# Patient Record
Sex: Female | Born: 1960 | Race: White | Hispanic: No | Smoking: Never smoker
Health system: Southern US, Community
[De-identification: ages and names within clinical notes are randomized; demographics above are authoritative.]

## PROBLEM LIST (undated history)

## (undated) DIAGNOSIS — F319 Bipolar disorder, unspecified: Secondary | ICD-10-CM

## (undated) DIAGNOSIS — F419 Anxiety disorder, unspecified: Secondary | ICD-10-CM

## (undated) DIAGNOSIS — F329 Major depressive disorder, single episode, unspecified: Secondary | ICD-10-CM

## (undated) DIAGNOSIS — E785 Hyperlipidemia, unspecified: Secondary | ICD-10-CM

## (undated) DIAGNOSIS — I10 Essential (primary) hypertension: Secondary | ICD-10-CM

## (undated) HISTORY — DX: Hyperlipidemia, unspecified: E78.5

## (undated) HISTORY — PX: WISDOM TOOTH EXTRACTION: SHX21

## (undated) HISTORY — PX: BUNIONECTOMY: SHX129

---

## 1999-09-25 ENCOUNTER — Other Ambulatory Visit: Admission: RE | Admit: 1999-09-25 | Discharge: 1999-09-25 | Payer: Self-pay | Admitting: Obstetrics and Gynecology

## 1999-10-14 ENCOUNTER — Encounter: Payer: Self-pay | Admitting: Obstetrics and Gynecology

## 1999-10-14 ENCOUNTER — Encounter: Admission: RE | Admit: 1999-10-14 | Discharge: 1999-10-14 | Payer: Self-pay | Admitting: Obstetrics and Gynecology

## 2000-08-24 ENCOUNTER — Other Ambulatory Visit: Admission: RE | Admit: 2000-08-24 | Discharge: 2000-08-24 | Payer: Self-pay | Admitting: Obstetrics and Gynecology

## 2002-05-08 ENCOUNTER — Other Ambulatory Visit: Admission: RE | Admit: 2002-05-08 | Discharge: 2002-05-08 | Payer: Self-pay | Admitting: Gynecology

## 2004-02-10 ENCOUNTER — Emergency Department (HOSPITAL_COMMUNITY): Admission: AD | Admit: 2004-02-10 | Discharge: 2004-02-10 | Payer: Self-pay | Admitting: Family Medicine

## 2004-02-15 ENCOUNTER — Emergency Department (HOSPITAL_COMMUNITY): Admission: AD | Admit: 2004-02-15 | Discharge: 2004-02-15 | Payer: Self-pay | Admitting: Family Medicine

## 2004-03-31 ENCOUNTER — Other Ambulatory Visit (HOSPITAL_COMMUNITY): Admission: RE | Admit: 2004-03-31 | Discharge: 2004-04-18 | Payer: Self-pay | Admitting: Psychiatry

## 2004-08-06 ENCOUNTER — Encounter: Admission: RE | Admit: 2004-08-06 | Discharge: 2004-10-01 | Payer: Self-pay | Admitting: Family Medicine

## 2004-08-29 ENCOUNTER — Emergency Department (HOSPITAL_COMMUNITY): Admission: EM | Admit: 2004-08-29 | Discharge: 2004-08-29 | Payer: Self-pay | Admitting: Family Medicine

## 2005-03-06 ENCOUNTER — Emergency Department (HOSPITAL_COMMUNITY): Admission: EM | Admit: 2005-03-06 | Discharge: 2005-03-06 | Payer: Self-pay | Admitting: Emergency Medicine

## 2005-11-24 ENCOUNTER — Other Ambulatory Visit: Admission: RE | Admit: 2005-11-24 | Discharge: 2005-11-24 | Payer: Self-pay | Admitting: Gynecology

## 2007-04-12 ENCOUNTER — Other Ambulatory Visit: Admission: RE | Admit: 2007-04-12 | Discharge: 2007-04-12 | Payer: Self-pay | Admitting: Gynecology

## 2008-08-14 ENCOUNTER — Encounter: Admission: RE | Admit: 2008-08-14 | Discharge: 2008-08-14 | Payer: Self-pay | Admitting: Gynecology

## 2008-08-17 ENCOUNTER — Encounter: Admission: RE | Admit: 2008-08-17 | Discharge: 2008-08-17 | Payer: Self-pay | Admitting: Gynecology

## 2009-11-20 ENCOUNTER — Encounter: Admission: RE | Admit: 2009-11-20 | Discharge: 2009-11-20 | Payer: Self-pay | Admitting: Gynecology

## 2010-12-14 ENCOUNTER — Encounter: Payer: Self-pay | Admitting: Gynecology

## 2012-01-27 ENCOUNTER — Inpatient Hospital Stay: Payer: Self-pay | Admitting: Psychiatry

## 2012-01-27 LAB — URINALYSIS, COMPLETE
Bacteria: NONE SEEN
Bilirubin,UR: NEGATIVE
Glucose,UR: NEGATIVE mg/dL (ref 0–75)
Ketone: NEGATIVE
Leukocyte Esterase: NEGATIVE
Nitrite: NEGATIVE
Ph: 5 (ref 4.5–8.0)
Protein: NEGATIVE
RBC,UR: 1 /HPF (ref 0–5)
Specific Gravity: 1.013 (ref 1.003–1.030)
Squamous Epithelial: <1
WBC UR: 1 /HPF (ref 0–5)

## 2012-01-27 LAB — COMPREHENSIVE METABOLIC PANEL
Albumin: 4.3 g/dL (ref 3.4–5.0)
Alkaline Phosphatase: 66 U/L (ref 50–136)
Anion Gap: 7 (ref 7–16)
BUN: 14 mg/dL (ref 7–18)
Bilirubin,Total: 0.4 mg/dL (ref 0.2–1.0)
Calcium, Total: 8.8 mg/dL (ref 8.5–10.1)
Chloride: 100 mmol/L (ref 98–107)
Co2: 32 mmol/L (ref 21–32)
Creatinine: 0.87 mg/dL (ref 0.60–1.30)
EGFR (African American): >60
EGFR (Non-African Amer.): >60
Glucose: 94 mg/dL (ref 65–99)
Osmolality: 278 (ref 275–301)
Potassium: 3.6 mmol/L (ref 3.5–5.1)
SGOT(AST): 17 U/L (ref 15–37)
SGPT (ALT): 18 U/L
Sodium: 139 mmol/L (ref 136–145)
Total Protein: 8 g/dL (ref 6.4–8.2)

## 2012-01-27 LAB — ETHANOL
Ethanol %: <0.003 % (ref 0.000–0.080)
Ethanol: <3 mg/dL

## 2012-01-27 LAB — DRUG SCREEN, URINE

## 2012-01-27 LAB — CBC
HCT: 42.6 % (ref 35.0–47.0)
HGB: 14.3 g/dL (ref 12.0–16.0)
MCH: 30.8 pg (ref 26.0–34.0)
MCHC: 33.5 g/dL (ref 32.0–36.0)
MCV: 92 fL (ref 80–100)
Platelet: 311 10*3/uL (ref 150–440)
RBC: 4.65 10*6/uL (ref 3.80–5.20)
RDW: 13.1 % (ref 11.5–14.5)
WBC: 9.2 10*3/uL (ref 3.6–11.0)

## 2012-01-27 LAB — ACETAMINOPHEN LEVEL: Acetaminophen: <2 ug/mL

## 2012-01-27 LAB — TSH: Thyroid Stimulating Horm: 1.58 u[IU]/mL

## 2012-01-27 LAB — SALICYLATE LEVEL: Salicylates, Serum: <1.7 mg/dL

## 2012-01-28 LAB — FOLATE: Folic Acid: 22.9 ng/mL (ref 3.1–100.0)

## 2012-02-01 LAB — COMPREHENSIVE METABOLIC PANEL
Albumin: 3.1 g/dL — ABNORMAL LOW (ref 3.4–5.0)
Alkaline Phosphatase: 45 U/L — ABNORMAL LOW (ref 50–136)
Anion Gap: 10 (ref 7–16)
BUN: 14 mg/dL (ref 7–18)
Bilirubin,Total: 0.4 mg/dL (ref 0.2–1.0)
Calcium, Total: 8.5 mg/dL (ref 8.5–10.1)
Chloride: 100 mmol/L (ref 98–107)
Co2: 31 mmol/L (ref 21–32)
Creatinine: 0.76 mg/dL (ref 0.60–1.30)
EGFR (African American): >60
EGFR (Non-African Amer.): >60
Glucose: 79 mg/dL (ref 65–99)
Osmolality: 281 (ref 275–301)
Potassium: 4.1 mmol/L (ref 3.5–5.1)
SGOT(AST): 15 U/L (ref 15–37)
SGPT (ALT): 13 U/L
Sodium: 141 mmol/L (ref 136–145)
Total Protein: 6 g/dL — ABNORMAL LOW (ref 6.4–8.2)

## 2012-02-01 LAB — VALPROIC ACID LEVEL: Valproic Acid: 56 ug/mL

## 2012-02-04 LAB — VALPROIC ACID LEVEL: Valproic Acid: 79 ug/mL

## 2012-02-09 ENCOUNTER — Inpatient Hospital Stay: Payer: Self-pay | Admitting: Psychiatry

## 2012-02-09 LAB — CBC
HCT: 39.5 % (ref 35.0–47.0)
HGB: 13.3 g/dL (ref 12.0–16.0)
MCH: 30.8 pg (ref 26.0–34.0)
MCHC: 33.7 g/dL (ref 32.0–36.0)
MCV: 91 fL (ref 80–100)
Platelet: 266 10*3/uL (ref 150–440)
RBC: 4.32 10*6/uL (ref 3.80–5.20)
RDW: 12.9 % (ref 11.5–14.5)
WBC: 8.4 10*3/uL (ref 3.6–11.0)

## 2012-02-09 LAB — COMPREHENSIVE METABOLIC PANEL
Albumin: 3.8 g/dL (ref 3.4–5.0)
Alkaline Phosphatase: 50 U/L (ref 50–136)
Anion Gap: 14 (ref 7–16)
BUN: 16 mg/dL (ref 7–18)
Bilirubin,Total: 0.3 mg/dL (ref 0.2–1.0)
Calcium, Total: 8.4 mg/dL — ABNORMAL LOW (ref 8.5–10.1)
Chloride: 101 mmol/L (ref 98–107)
Co2: 25 mmol/L (ref 21–32)
Creatinine: 0.7 mg/dL (ref 0.60–1.30)
EGFR (African American): >60
EGFR (Non-African Amer.): >60
Glucose: 90 mg/dL (ref 65–99)
Osmolality: 280 (ref 275–301)
Potassium: 4.1 mmol/L (ref 3.5–5.1)
SGOT(AST): 18 U/L (ref 15–37)
SGPT (ALT): 17 U/L
Sodium: 140 mmol/L (ref 136–145)
Total Protein: 7.3 g/dL (ref 6.4–8.2)

## 2012-02-09 LAB — ACETAMINOPHEN LEVEL: Acetaminophen: <2 ug/mL

## 2012-02-09 LAB — DRUG SCREEN, URINE

## 2012-02-09 LAB — URINALYSIS, COMPLETE
Bacteria: NONE SEEN
Bilirubin,UR: NEGATIVE
Blood: NEGATIVE
Glucose,UR: NEGATIVE mg/dL (ref 0–75)
Leukocyte Esterase: NEGATIVE
Nitrite: NEGATIVE
Ph: 6 (ref 4.5–8.0)
Protein: NEGATIVE
RBC,UR: 2 /HPF (ref 0–5)
Specific Gravity: 1.017 (ref 1.003–1.030)
Squamous Epithelial: 5
WBC UR: 1 /HPF (ref 0–5)

## 2012-02-09 LAB — SALICYLATE LEVEL: Salicylates, Serum: <1.7 mg/dL

## 2012-02-09 LAB — VALPROIC ACID LEVEL
Valproic Acid: 287 ug/mL
Valproic Acid: 166 ug/mL — ABNORMAL HIGH

## 2012-02-09 LAB — ETHANOL
Ethanol %: <0.003 % (ref 0.000–0.080)
Ethanol: <3 mg/dL

## 2012-02-09 LAB — LITHIUM LEVEL: Lithium: <0.2 mmol/L — ABNORMAL LOW

## 2012-02-09 LAB — TSH: Thyroid Stimulating Horm: 1.62 u[IU]/mL

## 2012-02-09 LAB — PREGNANCY, URINE: Pregnancy Test, Urine: NEGATIVE m[IU]/mL

## 2012-02-10 LAB — HEPATIC FUNCTION PANEL A (ARMC)
Albumin: 3.2 g/dL — ABNORMAL LOW (ref 3.4–5.0)
Alkaline Phosphatase: 36 U/L — ABNORMAL LOW (ref 50–136)
Bilirubin, Direct: 0.2 mg/dL (ref 0.00–0.20)
Bilirubin,Total: 0.6 mg/dL (ref 0.2–1.0)
SGOT(AST): 10 U/L — ABNORMAL LOW (ref 15–37)
SGPT (ALT): 13 U/L
Total Protein: 6 g/dL — ABNORMAL LOW (ref 6.4–8.2)

## 2012-02-10 LAB — CBC WITH DIFFERENTIAL/PLATELET
Basophil #: 0 10*3/uL (ref 0.0–0.1)
Basophil %: 0.3 %
Eosinophil #: 0 10*3/uL (ref 0.0–0.7)
Eosinophil %: 0.3 %
HCT: 36.2 % (ref 35.0–47.0)
HGB: 12.3 g/dL (ref 12.0–16.0)
Lymphocyte #: 1.5 10*3/uL (ref 1.0–3.6)
Lymphocyte %: 14.5 %
MCH: 31.1 pg (ref 26.0–34.0)
MCHC: 33.9 g/dL (ref 32.0–36.0)
MCV: 92 fL (ref 80–100)
Monocyte #: 0.7 10*3/uL (ref 0.0–0.7)
Monocyte %: 7.2 %
Neutrophil #: 7.8 10*3/uL — ABNORMAL HIGH (ref 1.4–6.5)
Neutrophil %: 77.7 %
Platelet: 228 10*3/uL (ref 150–440)
RBC: 3.96 10*6/uL (ref 3.80–5.20)
RDW: 12.6 % (ref 11.5–14.5)
WBC: 10 10*3/uL (ref 3.6–11.0)

## 2012-02-10 LAB — COMPREHENSIVE METABOLIC PANEL
Anion Gap: 6 — ABNORMAL LOW (ref 7–16)
BUN: 14 mg/dL (ref 7–18)
Calcium, Total: 7.7 mg/dL — ABNORMAL LOW (ref 8.5–10.1)
Chloride: 105 mmol/L (ref 98–107)
Co2: 27 mmol/L (ref 21–32)
Creatinine: 0.75 mg/dL (ref 0.60–1.30)
EGFR (African American): >60
EGFR (Non-African Amer.): >60
Glucose: 85 mg/dL (ref 65–99)
Osmolality: 275 (ref 275–301)
Potassium: 3.6 mmol/L (ref 3.5–5.1)
Sodium: 138 mmol/L (ref 136–145)

## 2012-02-10 LAB — VALPROIC ACID LEVEL: Valproic Acid: 74 ug/mL

## 2012-02-26 LAB — BASIC METABOLIC PANEL
Anion Gap: 5 — ABNORMAL LOW (ref 7–16)
BUN: 19 mg/dL — ABNORMAL HIGH (ref 7–18)
Calcium, Total: 8.7 mg/dL (ref 8.5–10.1)
Chloride: 104 mmol/L (ref 98–107)
Co2: 31 mmol/L (ref 21–32)
Creatinine: 0.9 mg/dL (ref 0.60–1.30)
EGFR (African American): >60
EGFR (Non-African Amer.): >60
Glucose: 80 mg/dL (ref 65–99)
Osmolality: 281 (ref 275–301)
Potassium: 4.1 mmol/L (ref 3.5–5.1)
Sodium: 140 mmol/L (ref 136–145)

## 2012-02-26 LAB — LITHIUM LEVEL: Lithium: 0.6 mmol/L

## 2012-08-16 ENCOUNTER — Encounter (HOSPITAL_COMMUNITY): Payer: Self-pay | Admitting: Emergency Medicine

## 2012-08-16 ENCOUNTER — Inpatient Hospital Stay (HOSPITAL_COMMUNITY)
Admission: EM | Admit: 2012-08-16 | Discharge: 2012-08-18 | DRG: 918 | Payer: MEDICAID | Attending: Internal Medicine | Admitting: Internal Medicine

## 2012-08-16 DIAGNOSIS — F319 Bipolar disorder, unspecified: Secondary | ICD-10-CM | POA: Diagnosis present

## 2012-08-16 DIAGNOSIS — T394X1A Poisoning by antirheumatics, not elsewhere classified, accidental (unintentional), initial encounter: Secondary | ICD-10-CM

## 2012-08-16 DIAGNOSIS — T39094A Poisoning by salicylates, undetermined, initial encounter: Principal | ICD-10-CM

## 2012-08-16 DIAGNOSIS — T450X4A Poisoning by antiallergic and antiemetic drugs, undetermined, initial encounter: Secondary | ICD-10-CM | POA: Diagnosis present

## 2012-08-16 DIAGNOSIS — T50992A Poisoning by other drugs, medicaments and biological substances, intentional self-harm, initial encounter: Secondary | ICD-10-CM | POA: Diagnosis present

## 2012-08-16 DIAGNOSIS — Z79899 Other long term (current) drug therapy: Secondary | ICD-10-CM

## 2012-08-16 DIAGNOSIS — T394X2A Poisoning by antirheumatics, not elsewhere classified, intentional self-harm, initial encounter: Secondary | ICD-10-CM | POA: Diagnosis present

## 2012-08-16 DIAGNOSIS — T39091A Poisoning by salicylates, accidental (unintentional), initial encounter: Secondary | ICD-10-CM | POA: Diagnosis present

## 2012-08-16 DIAGNOSIS — F411 Generalized anxiety disorder: Secondary | ICD-10-CM | POA: Diagnosis present

## 2012-08-16 LAB — CBC WITH DIFFERENTIAL/PLATELET
Basophils Absolute: 0 10*3/uL (ref 0.0–0.1)
Basophils Relative: 1 % (ref 0–1)
Eosinophils Absolute: 0 10*3/uL (ref 0.0–0.7)
Eosinophils Relative: 0 % (ref 0–5)
HCT: 41.1 % (ref 36.0–46.0)
Hemoglobin: 14 g/dL (ref 12.0–15.0)
Lymphocytes Relative: 14 % (ref 12–46)
Lymphs Abs: 0.9 10*3/uL (ref 0.7–4.0)
MCH: 30.2 pg (ref 26.0–34.0)
MCHC: 34.1 g/dL (ref 30.0–36.0)
MCV: 88.8 fL (ref 78.0–100.0)
Monocytes Absolute: 0.5 10*3/uL (ref 0.1–1.0)
Monocytes Relative: 8 % (ref 3–12)
Neutro Abs: 5 10*3/uL (ref 1.7–7.7)
Neutrophils Relative %: 78 % — ABNORMAL HIGH (ref 43–77)
Platelets: 287 10*3/uL (ref 150–400)
RBC: 4.63 MIL/uL (ref 3.87–5.11)
RDW: 12.8 % (ref 11.5–15.5)
WBC: 6.4 10*3/uL (ref 4.0–10.5)

## 2012-08-16 LAB — COMPREHENSIVE METABOLIC PANEL
ALT: 9 U/L (ref 0–35)
AST: 14 U/L (ref 0–37)
Albumin: 4 g/dL (ref 3.5–5.2)
Alkaline Phosphatase: 58 U/L (ref 39–117)
BUN: 11 mg/dL (ref 6–23)
CO2: 27 mEq/L (ref 19–32)
Calcium: 9.4 mg/dL (ref 8.4–10.5)
Chloride: 100 mEq/L (ref 96–112)
Creatinine, Ser: 0.8 mg/dL (ref 0.50–1.10)
GFR calc Af Amer: >90 mL/min (ref >90)
GFR calc non Af Amer: 85 mL/min — ABNORMAL LOW (ref >90)
Glucose, Bld: 98 mg/dL (ref 70–99)
Potassium: 3.9 mEq/L (ref 3.5–5.1)
Sodium: 139 mEq/L (ref 135–145)
Total Bilirubin: 0.2 mg/dL — ABNORMAL LOW (ref 0.3–1.2)
Total Protein: 7.1 g/dL (ref 6.0–8.3)

## 2012-08-16 LAB — SALICYLATE LEVEL
Salicylate Lvl: 34.1 mg/dL (ref 2.8–20.0)
Salicylate Lvl: 25.7 mg/dL — ABNORMAL HIGH (ref 2.8–20.0)
Salicylate Lvl: 23 mg/dL — ABNORMAL HIGH (ref 2.8–20.0)

## 2012-08-16 LAB — RAPID URINE DRUG SCREEN, HOSP PERFORMED
Amphetamines: NOT DETECTED
Barbiturates: NOT DETECTED
Benzodiazepines: NOT DETECTED
Cocaine: NOT DETECTED
Opiates: NOT DETECTED
Tetrahydrocannabinol: NOT DETECTED

## 2012-08-16 LAB — BASIC METABOLIC PANEL
BUN: 12 mg/dL (ref 6–23)
BUN: 14 mg/dL (ref 6–23)
CO2: 28 mEq/L (ref 19–32)
CO2: 28 mEq/L (ref 19–32)
Calcium: 8.5 mg/dL (ref 8.4–10.5)
Calcium: 8.3 mg/dL — ABNORMAL LOW (ref 8.4–10.5)
Chloride: 103 mEq/L (ref 96–112)
Chloride: 104 mEq/L (ref 96–112)
Creatinine, Ser: 0.93 mg/dL (ref 0.50–1.10)
Creatinine, Ser: 0.95 mg/dL (ref 0.50–1.10)
GFR calc Af Amer: 82 mL/min — ABNORMAL LOW (ref >90)
GFR calc Af Amer: 80 mL/min — ABNORMAL LOW (ref >90)
GFR calc non Af Amer: 71 mL/min — ABNORMAL LOW (ref >90)
GFR calc non Af Amer: 69 mL/min — ABNORMAL LOW (ref >90)
Glucose, Bld: 135 mg/dL — ABNORMAL HIGH (ref 70–99)
Glucose, Bld: 94 mg/dL (ref 70–99)
Potassium: 3.6 mEq/L (ref 3.5–5.1)
Potassium: 3.6 mEq/L (ref 3.5–5.1)
Sodium: 140 mEq/L (ref 135–145)
Sodium: 140 mEq/L (ref 135–145)

## 2012-08-16 LAB — ETHANOL: Alcohol, Ethyl (B): <11 mg/dL (ref 0–11)

## 2012-08-16 LAB — ACETAMINOPHEN LEVEL: Acetaminophen (Tylenol), Serum: <15 ug/mL (ref 10–30)

## 2012-08-16 MED ORDER — QUETIAPINE FUMARATE 400 MG PO TABS
400.0000 mg | ORAL_TABLET | Freq: Every day | ORAL | Status: DC
Start: 2012-08-16 — End: 2012-08-18
  Administered 2012-08-16 – 2012-08-17 (×2): 400 mg via ORAL
  Filled 2012-08-16 (×3): qty 1

## 2012-08-16 MED ORDER — ONDANSETRON HCL 4 MG/2ML IJ SOLN: 4.0000 mg | Freq: Four times a day (QID) | INTRAMUSCULAR | Status: DC | PRN

## 2012-08-16 MED ORDER — ONDANSETRON HCL 4 MG PO TABS: 4.0000 mg | ORAL_TABLET | Freq: Four times a day (QID) | ORAL | Status: DC | PRN

## 2012-08-16 MED ORDER — SODIUM CHLORIDE 0.9 % IJ SOLN
3.0000 mL | Freq: Two times a day (BID) | INTRAMUSCULAR | Status: DC
  Administered 2012-08-17: 3 mL via INTRAVENOUS

## 2012-08-16 MED ORDER — BISACODYL 5 MG PO TBEC: 5.0000 mg | DELAYED_RELEASE_TABLET | Freq: Every day | ORAL | Status: DC | PRN

## 2012-08-16 MED ORDER — SODIUM CHLORIDE 0.9 % IV SOLN
1000.0000 mL | Freq: Once | INTRAVENOUS | Status: AC
  Administered 2012-08-16: 1000 mL via INTRAVENOUS

## 2012-08-16 MED ORDER — SODIUM CHLORIDE 0.9 % IV SOLN: INTRAVENOUS | Status: DC

## 2012-08-16 MED ORDER — SODIUM CHLORIDE 0.9 % IV SOLN
1000.0000 mL | INTRAVENOUS | Status: DC
  Administered 2012-08-16: 1000 mL via INTRAVENOUS

## 2012-08-16 MED ORDER — ENOXAPARIN SODIUM 40 MG/0.4ML ~~LOC~~ SOLN
40.0000 mg | SUBCUTANEOUS | Status: DC
  Administered 2012-08-16 – 2012-08-17 (×2): 40 mg via SUBCUTANEOUS
  Filled 2012-08-16 (×3): qty 0.4

## 2012-08-16 MED ORDER — SODIUM CHLORIDE 0.9 % IV SOLN
INTRAVENOUS | Status: DC
  Administered 2012-08-17 (×2): via INTRAVENOUS

## 2012-08-16 MED ORDER — ADULT MULTIVITAMIN W/MINERALS CH
1.0000 | ORAL_TABLET | Freq: Every day | ORAL | Status: DC
  Administered 2012-08-16 – 2012-08-18 (×3): 1 via ORAL
  Filled 2012-08-16 (×3): qty 1

## 2012-08-16 MED ORDER — CALCIUM CARBONATE-VITAMIN D 500-200 MG-UNIT PO TABS
1.0000 | ORAL_TABLET | Freq: Every day | ORAL | Status: DC
  Administered 2012-08-16 – 2012-08-18 (×3): 1 via ORAL
  Filled 2012-08-16 (×3): qty 1

## 2012-08-16 MED ORDER — INFLUENZA VIRUS VACC SPLIT PF IM SUSP: 0.5000 mL | INTRAMUSCULAR | Status: DC

## 2012-08-16 NOTE — ED Notes (Signed)
Pt c/o of overdose on Allergy medication and aspirin x5. States that she is "a little sleepy" , NAD at this time. States that she is very agitated about personal things. Pt is SI, denies HI.

## 2012-08-16 NOTE — Progress Notes (Signed)
[name], is a 51 y.o. female,   MRN: [id number]  -  DOB - 12-Oct-1961  Outpatient Primary MD for the patient is No primary provider on file.  in for    Chief Complaint  Patient presents with  . Drug Overdose     Blood pressure 127/82, pulse 86, temperature 99 F (37.2 C), temperature source Oral, resp. rate 17, SpO2 98.00%.  Principal Problem:  *Salicylate overdose    Pt 51 yo with hx deppression states she took an overdose of medications at 8am this morning.  Reports taking a handful of aspirins and a handful of benadryl tablets. Reports  feeling anxious and worse over the last few weeks. Does not admit to SI.   Work up in ED yields salicylate level 34.1. ED MD spoke to poison control who recommended IV hydration and serial salicylate levels and monitoring until levels trending downward. Last level drawn at 12noon according to ED MD.  On exam, awake, VSS, NAD. Will admit to obs.

## 2012-08-16 NOTE — ED Provider Notes (Signed)
History     CSN: [id number] Arrival date & time 08/16/12  1210  First MD Initiated Contact with Patient 08/16/12 1227      Chief Complaint  Patient presents with  . Drug Overdose    HPI Pt states she took an overdose of medications at 8am.  She took a handful of aspirins and a handful of benadryl tablets.  She does not specifically state whether she was trying to kill herself but wanted to escape from her problems.  NO definite trigger today.  She has been feeling anxious and worse over the last few weeks.   SHe feels ok now except for mild drowsiness.  She has had previous overdose attempts.  SHe also took 5 seroquel last week as well.  She did not tell anyone until today.   History reviewed. No pertinent past medical history.  History reviewed. No pertinent past surgical history.  No family history on file.  History  Substance Use Topics  . Smoking status: Never Smoker   . Smokeless tobacco: Not on file  . Alcohol Use:     OB History    Grav Para Term Preterm Abortions TAB SAB Ect Mult Living                  Review of Systems  All other systems reviewed and are negative.    Allergies  Review of patient's allergies indicates no known allergies.  Home Medications   Current Outpatient Rx  Name Route Sig Dispense Refill  . CALCIUM CARBONATE-VITAMIN D 500-200 MG-UNIT PO TABS Oral Take 1 tablet by mouth daily.    . ADULT MULTIVITAMIN W/MINERALS CH Oral Take 1 tablet by mouth daily.    . QUETIAPINE FUMARATE 400 MG PO TABS Oral Take 400 mg by mouth at bedtime.      BP 127/82  Pulse 86  Temp 99 F (37.2 C) (Oral)  Resp 17  SpO2 98%  Physical Exam  Nursing note and vitals reviewed. Constitutional: She appears well-developed and well-nourished. No distress.  HENT:  Head: Normocephalic and atraumatic.  Right Ear: External ear normal.  Left Ear: External ear normal.  Eyes: Conjunctivae normal are normal. Right eye exhibits no discharge. Left eye exhibits no  discharge. No scleral icterus.  Neck: Neck supple. No tracheal deviation present.  Cardiovascular: Normal rate, regular rhythm and intact distal pulses.   Pulmonary/Chest: Effort normal and breath sounds normal. No stridor. No respiratory distress. She has no wheezes. She has no rales.  Abdominal: Soft. Bowel sounds are normal. She exhibits no distension. There is no tenderness. There is no rebound and no guarding.  Musculoskeletal: She exhibits no edema and no tenderness.  Neurological: She is alert. She has normal strength. No sensory deficit. Cranial nerve deficit:  no gross defecits noted. She exhibits normal muscle tone. She displays no seizure activity. Coordination normal.  Skin: Skin is warm and dry. No rash noted.  Psychiatric: Her speech is not rapid and/or pressured. She is slowed and withdrawn. She exhibits a depressed mood. She expresses suicidal ideation.    ED Course  Procedures (including critical care time)  Rate: 81  Rhythm: normal sinus rhythm  QRS Axis: normal  Intervals: normal  ST/T Wave abnormalities: Nonspecific T-wave changes  Conduction Disutrbances:none  Narrative Interpretation:   Old EKG Reviewed: No significant changes compared to prior tracing  CRITICAL CARE Performed by: Linwood Dibbles R Total critical care time: 35 Critical care time was exclusive of separately billable procedures and treating other patients. Critical  care was necessary to treat or prevent imminent or life-threatening deterioration. Critical care was time spent personally by me on the following activities: development of treatment plan with patient and/or surrogate as well as nursing, discussions with consultants, evaluation of patient's response to treatment, examination of patient, obtaining history from patient or surrogate, ordering and performing treatments and interventions, ordering and review of laboratory studies, ordering and review of radiographic studies, pulse oximetry and  re-evaluation of patient's condition.    Labs Reviewed  CBC WITH DIFFERENTIAL - Abnormal; Notable for the following:    Neutrophils Relative 78 (*)     All other components within normal limits  COMPREHENSIVE METABOLIC PANEL - Abnormal; Notable for the following:    Total Bilirubin 0.2 (*)     GFR calc non Af Amer 85 (*)     All other components within normal limits  SALICYLATE LEVEL - Abnormal; Notable for the following:    Salicylate Lvl 34.1 (*)     All other components within normal limits  URINE RAPID DRUG SCREEN (HOSP PERFORMED)  ETHANOL  ACETAMINOPHEN LEVEL   No results found.  Dx: Salicylate overdose  MDM  Discussed with poison center.  Recommends IV hydration.  Repeat levels in a few hours.  If levels rise above 40 will need alkalinization of urine.  Monitor loc.  For right now, continue with IV hydration.  Will plan on admission for observation and serial salicylate and electrolyte monitoring.  Avoid benzodiazepines.  Monitor for CNS change.  Ensure adequate urine output.       Celene Kras, MD 08/16/12 1434

## 2012-08-16 NOTE — H&P (Signed)
[facility] History and Physical  AMAI CAPPIELLO ZOX:[id number] DOB: 02-03-1961 DOA: 08/16/2012  Referring physician: Dr. Iantha Fallen PCP: No primary provider on file.   Chief Complaint: Drug overdose  HPI: [name] is a 51 y.o. female with history of bipolar disorder and prior admission to [facility] for overdose with Benadryl and presents with above complaints. She states that at about 8AM today she took about 30 tablets of aspirin and about 10-15 tablets of Benadryl. She denies suicidal ideation, stating that she just felt a lot of anxiety and wanted to make it go away so she took the pills. She reports that she is followed at mental health and in the past was on Klonopin but that was changed to a different medication in February. She denies homicidal ideation, no nausea/vomiting, also denies any other complaints. She was seen in the ED and her salicylate level was elevated at 34.1 with a CO2 of 27. Poison control was called and they recommended admission with monitoring salicylate levels every 3 hours as well as CO2/BMET until the salicylate levels begin to trend down. She is admitted for further evaluation and management.   Review of Systems: The patient denies anorexia, fever, weight loss,, vision loss, decreased hearing, hoarseness, chest pain, syncope, dyspnea on exertion, peripheral edema, balance deficits, hemoptysis, abdominal pain, melena, hematochezia, severe indigestion/heartburn, hematuria, incontinence, muscle weakness, suspicious skin lesions, transient blindness, difficulty walking, unusual weight change, abnormal .    History reviewed. No pertinent past medical history. Past Surgical History  Procedure Date  . Wisdom tooth extraction    Social History:  reports that she has never smoked. She has never used smokeless tobacco. She reports that she does not drink alcohol or use illicit drugs.  where does patient live--home Can patient participate in  ADLs-yes  No Known Allergies  History reviewed. No pertinent family history.  (be sure to complete)  Prior to Admission medications   Medication Sig Start Date End Date Taking? Authorizing Provider  calcium-vitamin D (OSCAL WITH D) 500-200 MG-UNIT per tablet Take 1 tablet by mouth daily.   Yes Historical Provider, MD  Multiple Vitamin (MULTIVITAMIN WITH MINERALS) TABS Take 1 tablet by mouth daily.   Yes Historical Provider, MD  QUEtiapine (SEROQUEL) 400 MG tablet Take 400 mg by mouth at bedtime.   Yes Historical Provider, MD   Physical Exam: Filed Vitals:   08/16/12 1224 08/16/12 1625  BP: 127/82 106/78  Pulse: 86 79  Temp: 99 F (37.2 C) 99.2 F (37.3 C)  TempSrc: Oral Oral  Resp: 17 18  SpO2: 98% 98%    Constitutional: Vital signs reviewed.  Patient is a well-developed and well-nourished in no acute distress and cooperative with exam. Alert and oriented x3.  Head: Normocephalic and atraumatic Mouth: no erythema or exudates, MMM Eyes: PERRL, EOMI, conjunctivae normal, No scleral icterus.  Neck: Supple, Trachea midline normal ROM, No JVD, mass, thyromegaly, or carotid bruit present.  Cardiovascular: RRR, S1 normal, S2 normal, no MRG, pulses symmetric and intact bilaterally Pulmonary/Chest: CTAB, no wheezes, rales, or rhonchi Abdominal: Soft. Non-tender, non-distended, bowel sounds are normal, no masses, organomegaly, or guarding present.  GU: no CVA tenderness Musculoskeletal: No joint deformities, erythema, or stiffness, ROM full and no non tender Extremities: No cyanosis and no edema Neurological: A&O x3, Strength is normal and symmetric bilaterally, cranial nerve II-XII are grossly intact, no focal motor deficit, sensory intact to light touch bilaterally.  Skin: Warm, dry and intact. No rash, cyanosis, or clubbing.  Psychiatric:flat affect.    Labs on Admission:  Basic Metabolic Panel:  Lab 08/16/12 1610  NA 139  K 3.9  CL 100  CO2 27  GLUCOSE 98  BUN 11    CREATININE 0.80  CALCIUM 9.4  MG --  PHOS --   Liver Function Tests:  Lab 08/16/12 1255  AST 14  ALT 9  ALKPHOS 58  BILITOT 0.2*  PROT 7.1  ALBUMIN 4.0   No results found for this basename: LIPASE:5,AMYLASE:5 in the last 168 hours No results found for this basename: AMMONIA:5 in the last 168 hours CBC:  Lab 08/16/12 1255  WBC 6.4  NEUTROABS 5.0  HGB 14.0  HCT 41.1  MCV 88.8  PLT 287   Cardiac Enzymes: No results found for this basename: CKTOTAL:5,CKMB:5,CKMBINDEX:5,TROPONINI:5 in the last 168 hours  BNP (last 3 results) No results found for this basename: PROBNP:3 in the last 8760 hours CBG: No results found for this basename: GLUCAP:5 in the last 168 hours  Radiological Exams on Admission: No results found.    Assessment/Plan Principal Problem:  *Salicylate overdose Active Problems:  Bipolar disorder, unspecified  Principal Problem:  *Drug overdose -Salicylate and Benadryl -As discussed above, patient is asymptomatic and with no metabolic acidosis at this time. -Will monitor serial Bmet every 3 hours along with salicylate levels until the salicylate levels are trending down as per poison control recommendations -Suicide precautions even though patient denies suicidal ideation -I have consulted psychiatry to evaluate for further recommendations Active Problems:  Bipolar disorder, unspecified -Will continue Seroquel, psychiatry consulted as above.  Code Status: full Family Communication: mother at bedside Disposition Plan: admitted to tele with sitter/  Time spent: >46mins  Kela Millin [facility] Pager [phone]  If 7PM-7AM, please contact night-coverage [email or web address] Password [facility] 08/16/2012, 5:18 PM

## 2012-08-16 NOTE — Progress Notes (Signed)
Spoke with Alona Bene from poison control office, discussed vital signs, voiding, eating, iv fluids that are in progress.

## 2012-08-16 NOTE — ED Notes (Addendum)
Lab called about critical salicylate level. 34

## 2012-08-16 NOTE — ED Notes (Signed)
Patient changed into blue scrubs. Nothing on under scrubs. Pt wanded and one belonging bag searched by security. Bag locked in cabinet in room #17.

## 2012-08-17 LAB — BASIC METABOLIC PANEL
BUN: 16 mg/dL (ref 6–23)
BUN: 16 mg/dL (ref 6–23)
CO2: 25 mEq/L (ref 19–32)
CO2: 26 mEq/L (ref 19–32)
Calcium: 8.2 mg/dL — ABNORMAL LOW (ref 8.4–10.5)
Calcium: 8.2 mg/dL — ABNORMAL LOW (ref 8.4–10.5)
Chloride: 104 mEq/L (ref 96–112)
Chloride: 107 mEq/L (ref 96–112)
Creatinine, Ser: 0.87 mg/dL (ref 0.50–1.10)
Creatinine, Ser: 0.87 mg/dL (ref 0.50–1.10)
GFR calc Af Amer: 89 mL/min — ABNORMAL LOW (ref >90)
GFR calc Af Amer: 89 mL/min — ABNORMAL LOW (ref >90)
GFR calc non Af Amer: 77 mL/min — ABNORMAL LOW (ref >90)
GFR calc non Af Amer: 77 mL/min — ABNORMAL LOW (ref >90)
Glucose, Bld: 87 mg/dL (ref 70–99)
Glucose, Bld: 93 mg/dL (ref 70–99)
Potassium: 3.4 mEq/L — ABNORMAL LOW (ref 3.5–5.1)
Potassium: 3.8 mEq/L (ref 3.5–5.1)
Sodium: 139 mEq/L (ref 135–145)
Sodium: 141 mEq/L (ref 135–145)

## 2012-08-17 LAB — SALICYLATE LEVEL
Salicylate Lvl: 17 mg/dL (ref 2.8–20.0)
Salicylate Lvl: 18.8 mg/dL (ref 2.8–20.0)
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
## 2012-08-17 NOTE — Progress Notes (Signed)
[facility]             Progress Note   Subjective: No complaints/overnight events.  Objective: Vital signs in last 24 hours: Temp:  [98 F (36.7 C)-98.6 F (37 C)] 98 F (36.7 C) (09/25 1410) Pulse Rate:  [81-102] 92  (09/25 1410) Resp:  [16-18] 18  (09/25 1410) BP: (109-126)/(69-82) 126/82 mmHg (09/25 1410) SpO2:  [97 %-100 %] 100 % (09/25 1410) Weight:  [56.246 kg (124 lb)] 56.246 kg (124 lb) (09/24 1846) Weight change:  Last BM Date: 08/15/12  Intake/Output from previous day: 09/24 0701 - 09/25 0700 In: 240 [P.O.:240] Out: -      Physical Exam: General: Alert, awake, oriented x3, in no acute distress. HEENT: No bruits, no goiter. Heart: Regular rate and rhythm, without murmurs, rubs, gallops. Lungs: Clear to auscultation bilaterally. Abdomen: Soft, nontender, nondistended, positive bowel sounds. Extremities: No clubbing cyanosis or edema with positive pedal pulses. Neuro: Grossly intact, nonfocal.    Lab Results: Basic Metabolic Panel:  Basename 08/17/12 0230 08/16/12 2330  NA 141 139  K 3.8 3.4*  CL 107 104  CO2 26 25  GLUCOSE 87 93  BUN 16 16  CREATININE 0.87 0.87  CALCIUM 8.2* 8.2*  MG -- --  PHOS -- --   Liver Function Tests:  Basename 08/16/12 1255  AST 14  ALT 9  ALKPHOS 58  BILITOT 0.2*  PROT 7.1  ALBUMIN 4.0   CBC:  Basename 08/16/12 1255  WBC 6.4  NEUTROABS 5.0  HGB 14.0  HCT 41.1  MCV 88.8  PLT 287   Urine Drug Screen: Drugs of Abuse     Component Value Date/Time   LABOPIA NONE DETECTED 08/16/2012 1237   COCAINSCRNUR NONE DETECTED 08/16/2012 1237   LABBENZ NONE DETECTED 08/16/2012 1237   AMPHETMU NONE DETECTED 08/16/2012 1237   THCU NONE DETECTED 08/16/2012 1237   LABBARB NONE DETECTED 08/16/2012 1237    Alcohol Level:  Basename 08/16/12 1255  ETH <11   Studies/Results: No results found.  Medications: Scheduled Meds:   . calcium-vitamin D  1 tablet Oral Daily  . enoxaparin (LOVENOX) injection  40  mg Subcutaneous Q24H  . multivitamin with minerals  1 tablet Oral Daily  . QUEtiapine  400 mg Oral QHS  . sodium chloride  3 mL Intravenous Q12H   Continuous Infusions:   . sodium chloride 75 mL/hr at 08/17/12 1533   PRN Meds:.bisacodyl, ondansetron (ZOFRAN) IV, ondansetron  Assessment/Plan:  Principal Problem:  *Salicylate overdose Active Problems:  Bipolar disorder, unspecified   ASA Overdose -As a suicide attempt. -Would like to monitor for another day to make sure she does not develop metabolic acidosis. -Has been seen by psych who is recommending inpatient psychiatric care. -Patient is willing to proceed once she is medically clear. -Plan on DC to [facility] in am as long as no signs of acidosis.  Time spent coordinating care: 25 minutes   LOS: 1 day   [facility] Pager: [phone] 08/17/2012, 5:34 PM

## 2012-08-18 ENCOUNTER — Ambulatory Visit (HOSPITAL_COMMUNITY): Admission: AD | Admit: 2012-08-18 | Payer: Self-pay | Source: Ambulatory Visit | Admitting: Psychiatry

## 2012-08-18 ENCOUNTER — Encounter (HOSPITAL_COMMUNITY): Payer: Self-pay | Admitting: *Deleted

## 2012-08-18 ENCOUNTER — Inpatient Hospital Stay (HOSPITAL_COMMUNITY)
Admission: AD | Admit: 2012-08-18 | Discharge: 2012-08-25 | DRG: 885 | Disposition: A | Discharge status: Home / Self Care | Payer: Federal, State, Local not specified - Other | Source: Ambulatory Visit | Attending: Psychiatry | Admitting: Psychiatry

## 2012-08-18 DIAGNOSIS — F411 Generalized anxiety disorder: Secondary | ICD-10-CM | POA: Diagnosis present

## 2012-08-18 DIAGNOSIS — T39091A Poisoning by salicylates, accidental (unintentional), initial encounter: Secondary | ICD-10-CM

## 2012-08-18 DIAGNOSIS — F3130 Bipolar disorder, current episode depressed, mild or moderate severity, unspecified: Principal | ICD-10-CM | POA: Diagnosis present

## 2012-08-18 DIAGNOSIS — F319 Bipolar disorder, unspecified: Secondary | ICD-10-CM

## 2012-08-18 LAB — BASIC METABOLIC PANEL
BUN: 10 mg/dL (ref 6–23)
CO2: 24 mEq/L (ref 19–32)
Calcium: 8.2 mg/dL — ABNORMAL LOW (ref 8.4–10.5)
Chloride: 104 mEq/L (ref 96–112)
Creatinine, Ser: 0.76 mg/dL (ref 0.50–1.10)
GFR calc Af Amer: >90 mL/min (ref >90)
GFR calc non Af Amer: >90 mL/min (ref >90)
Glucose, Bld: 95 mg/dL (ref 70–99)
Potassium: 3.6 mEq/L (ref 3.5–5.1)
Sodium: 137 mEq/L (ref 135–145)

## 2012-08-18 MED ORDER — ALUM & MAG HYDROXIDE-SIMETH 200-200-20 MG/5ML PO SUSP: 30.0000 mL | ORAL | Status: DC | PRN

## 2012-08-18 MED ORDER — QUETIAPINE FUMARATE 400 MG PO TABS
400.0000 mg | ORAL_TABLET | Freq: Every day | ORAL | Status: DC
  Administered 2012-08-18 – 2012-08-24 (×7): 400 mg via ORAL
  Filled 2012-08-18 (×8): qty 1
  Filled 2012-08-18: qty 14
  Filled 2012-08-18 (×2): qty 1

## 2012-08-18 MED ORDER — NICOTINE 21 MG/24HR TD PT24
21.0000 mg | MEDICATED_PATCH | Freq: Every day | TRANSDERMAL | Status: DC
  Filled 2012-08-18 (×2): qty 1

## 2012-08-18 MED ORDER — HYDROXYZINE HCL 50 MG PO TABS
50.0000 mg | ORAL_TABLET | Freq: Every evening | ORAL | Status: DC | PRN
  Administered 2012-08-18: 50 mg via ORAL
  Filled 2012-08-18: qty 1

## 2012-08-18 MED ORDER — ACETAMINOPHEN 325 MG PO TABS: 650.0000 mg | ORAL_TABLET | Freq: Four times a day (QID) | ORAL | Status: DC | PRN

## 2012-08-18 MED ORDER — MAGNESIUM HYDROXIDE 400 MG/5ML PO SUSP: 30.0000 mL | Freq: Every day | ORAL | Status: DC | PRN

## 2012-08-18 MED ORDER — ADULT MULTIVITAMIN W/MINERALS CH
1.0000 | ORAL_TABLET | Freq: Every day | ORAL | Status: DC
  Administered 2012-08-19 – 2012-08-25 (×7): 1 via ORAL
  Filled 2012-08-18 (×3): qty 1
  Filled 2012-08-18: qty 14
  Filled 2012-08-18 (×8): qty 1

## 2012-08-18 MED ORDER — CALCIUM CARBONATE-VITAMIN D 500-200 MG-UNIT PO TABS
1.0000 | ORAL_TABLET | Freq: Every day | ORAL | Status: DC
  Administered 2012-08-19 – 2012-08-25 (×7): 1 via ORAL
  Filled 2012-08-18 (×9): qty 1
  Filled 2012-08-18: qty 14
  Filled 2012-08-18 (×2): qty 1

## 2012-08-18 NOTE — Progress Notes (Signed)
Per MD, Pt ready for d/c.  Notified [facility].  CSW to continue to follow.  Providence Crosby, LCSWA Clinical Social Work [phone]

## 2012-08-18 NOTE — Progress Notes (Signed)
Per psych MD, Pt will need [facility] when medically stable.  CSW will await medical clinic.  Providence Crosby, LCSWA Clinical Social Work [phone]

## 2012-08-18 NOTE — Tx Team (Signed)
Initial Interdisciplinary Treatment Plan  PATIENT STRENGTHS: (choose at least two) Ability for insight Average or above average intelligence Capable of independent living Communication skills Financial means General fund of knowledge Motivation for treatment/growth Supportive family/friends  PATIENT STRESSORS: Medication change or noncompliance Traumatic event   PROBLEM LIST: Problem List/Patient Goals Date to be addressed Date deferred Reason deferred Estimated date of resolution  Suicidal Attempt (OD on Pills) 08/18/12     Depression 08/18/12     Anxiety 08/18/12     Med Adjustment 08/18/12                                    DISCHARGE CRITERIA:  Ability to meet basic life and health needs Adequate post-discharge living arrangements Improved stabilization in mood, thinking, and/or behavior Need for constant or close observation no longer present Reduction of life-threatening or endangering symptoms to within safe limits  PRELIMINARY DISCHARGE PLAN: Attend aftercare/continuing care group Participate in family therapy Return to previous living arrangement  PATIENT/FAMIILY INVOLVEMENT: This treatment plan has been presented to and reviewed with the patient, [name], and/or family member.  The patient and family have been given the opportunity to ask questions and make suggestions.  Mickeal Needy 08/18/2012, 8:27 PM

## 2012-08-18 NOTE — Progress Notes (Addendum)
Patient ID: [name], female   DOB: [birth year], 51 y.o.   MRN: [id number] Pt. Is a 51 year old female admitted post OD on the 24 from [facility]. Pt. Reportedly OD on ASA and Benadryl. Previous reports says pt. Took handful of ASA & handful of Benadryl. Pt. Suspicious, cautious and uncertain during admission. She struggles to answers questions appears preoccupied but denies AVH.  Pt. Currently contracts for safety. Pt. Denies any significant medical hx. Pt. Lives with mom, she has a 87 y.o. Daughter. Pt. Reports she was inpatient at this facility 21 years ago for post partum depression. Staff offered something eat/drink. Pt. Oriented to unit/room. Staff will monitor q91min for safety.

## 2012-08-18 NOTE — Discharge Summary (Signed)
Physician Discharge Summary  Patient ID: [name] MRN: [id number] DOB/AGE: 04-02-1961 51 y.o.  Admit date: 08/16/2012 Discharge date: 08/18/2012  Primary Care Physician:  No primary provider on file.   Discharge Diagnoses:    Principal Problem:  *Salicylate overdose Active Problems:  Bipolar disorder, unspecified      Medication List     As of 08/18/2012 10:02 AM    TAKE these medications         calcium-vitamin D 500-200 MG-UNIT per tablet   Commonly known as: OSCAL WITH D   Take 1 tablet by mouth daily.      multivitamin with minerals Tabs   Take 1 tablet by mouth daily.      QUEtiapine 400 MG tablet   Commonly known as: SEROQUEL   Take 400 mg by mouth at bedtime.         Disposition and Follow-up:  Will be discharged to [facility] today for continued inpatient psychiatric care.  Consults:  Psychiatry, Dr. Ferol Luz   Significant Diagnostic Studies:  No results found.  Brief H and P: For complete details please refer to admission H and P, but in brief patient is a 51 y.o. female with history of bipolar disorder and prior admission to [facility] for overdose with Benadryl and presents with above complaints. She states that at about 8AM today she took about 30 tablets of aspirin and about 10-15 tablets of Benadryl. She denies suicidal ideation, stating that she just felt a lot of anxiety and wanted to make it go away so she took the pills.      Hospital Course:  Principal Problem:  *Salicylate overdose Active Problems:  Bipolar disorder, unspecified   ASA Overdose -Salicylate levels are normal. -No signs of acidosis on BMET. -CO2 has been 24-26.  Bipolar Disorder -Continue Seroquel.  Disposition -Per psychiatry, would benefit from an inpatient psych admission. -Patient is willing to go. -Will be discharged there today.  Time spent on Discharge: Less than 30 minutes.  SignedChaya Jan [facility] Pager: [phone] 08/18/2012, 10:02 AM

## 2012-08-18 NOTE — Progress Notes (Signed)
Per Delorise Jackson, Pt has been accepted.  Notified RN, Pt and Care Coordinator.  Pt signed consent.  Faxed signed consent to [facility].  RN to give report and facilitate d/c.  Pt to be d/c'd.  Providence Crosby, LCSWA Clinical Social Work [phone]

## 2012-08-19 DIAGNOSIS — F411 Generalized anxiety disorder: Secondary | ICD-10-CM | POA: Diagnosis present

## 2012-08-19 DIAGNOSIS — F319 Bipolar disorder, unspecified: Secondary | ICD-10-CM

## 2012-08-19 MED ORDER — HYDROXYZINE HCL 25 MG PO TABS
25.0000 mg | ORAL_TABLET | Freq: Three times a day (TID) | ORAL | Status: DC | PRN
  Administered 2012-08-19 – 2012-08-22 (×2): 25 mg via ORAL

## 2012-08-19 MED ORDER — LORATADINE 10 MG PO TABS
10.0000 mg | ORAL_TABLET | Freq: Every day | ORAL | Status: DC
  Administered 2012-08-19 – 2012-08-25 (×7): 10 mg via ORAL
  Filled 2012-08-19 (×6): qty 1
  Filled 2012-08-19: qty 14
  Filled 2012-08-19 (×4): qty 1

## 2012-08-19 MED ORDER — QUETIAPINE FUMARATE 25 MG PO TABS
12.5000 mg | ORAL_TABLET | Freq: Three times a day (TID) | ORAL | Status: DC
  Administered 2012-08-19 – 2012-08-25 (×18): 12.5 mg via ORAL
  Filled 2012-08-19: qty 0.5
  Filled 2012-08-19: qty 21
  Filled 2012-08-19 (×8): qty 0.5
  Filled 2012-08-19: qty 21
  Filled 2012-08-19 (×9): qty 0.5
  Filled 2012-08-19: qty 21
  Filled 2012-08-19 (×5): qty 0.5

## 2012-08-19 MED ORDER — PROPRANOLOL HCL 10 MG PO TABS
10.0000 mg | ORAL_TABLET | Freq: Four times a day (QID) | ORAL | Status: DC
  Administered 2012-08-19 – 2012-08-23 (×17): 10 mg via ORAL
  Filled 2012-08-19 (×25): qty 1

## 2012-08-19 MED ORDER — QUETIAPINE FUMARATE 25 MG PO TABS
25.0000 mg | ORAL_TABLET | Freq: Three times a day (TID) | ORAL | Status: DC
  Filled 2012-08-19 (×3): qty 1

## 2012-08-19 MED ORDER — LISINOPRIL 10 MG PO TABS
10.0000 mg | ORAL_TABLET | Freq: Every day | ORAL | Status: DC
  Administered 2012-08-19 – 2012-08-25 (×7): 10 mg via ORAL
  Filled 2012-08-19 (×3): qty 1
  Filled 2012-08-19: qty 14
  Filled 2012-08-19 (×8): qty 1

## 2012-08-19 NOTE — Progress Notes (Signed)
08/19/2012         Time: 1500      Group Topic/Focus: The focus of this group is on enhancing patients' problem solving skills, which involves identifying the problem, brainstorming solutions and choosing and trying a solution.  Participation Level: Active  Participation Quality: Appropriate and Attentive  Affect: Blunted  Cognitive: Oriented  Additional Comments: None.   Kari Kerth 08/19/2012 3:44 PM   

## 2012-08-19 NOTE — Progress Notes (Addendum)
[facility] MD Progress Note  08/19/2012 1:40 PM  Diagnosis:  Axis I: Bipolar, Depressed, Generalized Anxiety Disorder and Social Anxiety Axis II: Deferred Axis III: History reviewed. No pertinent past medical history. Axis IV: other psychosocial or environmental problems Axis V: 21-30 behavior considerably influenced by delusions or hallucinations OR serious impairment in judgment, communication OR inability to function in almost all areas  ADL's:  Intact  Sleep: Good  Appetite:  Good  Suicidal Ideation:  Prior to hospital pt took handful of aspirin and 10-15 Benadryl to help her go to sleep for the 4th time this year. Homicidal Ideation:  Pt denies any thoughts, plans, intent of homicide  AEB (as evidenced by):per pt report  Mental Status Examination/Evaluation: Objective:  Appearance: Casual  Eye Contact::  Good  Speech:  Clear and Coherent  Volume:  Normal  Mood:  Anxious, Depressed, Irritable and Worthless  Affect:  Congruent  Thought Process:  Coherent  Orientation:  Full  Thought Content:  WDL  Suicidal Thoughts:  Yes.  without intent/plan  Homicidal Thoughts:  No  Memory:  Immediate;   Fair Recent;   Fair Remote;   Fair  Judgement:  Impaired  Insight:  Lacking  Psychomotor Activity:  Normal  Concentration:  Fair  Recall:  Fair  Akathisia:  No  Handed:  Left  AIMS (if indicated):     Assets:  Communication Skills Desire for Improvement  Sleep:  Number of Hours: 5.25    Vital Signs:Blood pressure 130/92, pulse 83, temperature 96.9 F (36.1 C), temperature source Oral, resp. rate 24, height 5' (1.524 m), weight 56.246 kg (124 lb), last menstrual period 07/18/2012. Current Medications: Current Facility-Administered Medications  Medication Dose Route Frequency Provider Last Rate Last Dose  . acetaminophen (TYLENOL) tablet 650 mg  650 mg Oral Q6H PRN Mickie D. Adams, PA      . alum & mag hydroxide-simeth (MAALOX/MYLANTA) 200-200-20 MG/5ML suspension 30 mL  30 mL Oral  Q4H PRN Mickie D. Adams, PA      . calcium-vitamin D (OSCAL WITH D) 500-200 MG-UNIT per tablet 1 tablet  1 tablet Oral Daily Mickie D. Adams, PA   1 tablet at 08/19/12 0750  . hydrOXYzine (ATARAX/VISTARIL) tablet 25 mg  25 mg Oral TID PRN Mike Craze, MD      . loratadine (CLARITIN) tablet 10 mg  10 mg Oral Daily Mike Craze, MD      . magnesium hydroxide (MILK OF MAGNESIA) suspension 30 mL  30 mL Oral Daily PRN Mickie D. Adams, PA      . multivitamin with minerals tablet 1 tablet  1 tablet Oral Daily Mickie D. Adams, PA   1 tablet at 08/19/12 0750  . propranolol (INDERAL) tablet 10 mg  10 mg Oral QID Mike Craze, MD      . QUEtiapine (SEROQUEL) tablet 12.5 mg  12.5 mg Oral TID Mike Craze, MD      . QUEtiapine (SEROQUEL) tablet 400 mg  400 mg Oral QHS Mickie D. Adams, PA   400 mg at 08/18/12 2216  . DISCONTD: hydrOXYzine (ATARAX/VISTARIL) tablet 50 mg  50 mg Oral QHS PRN,MR X 1 Mickie D. Adams, PA   50 mg at 08/18/12 2050  . DISCONTD: nicotine (NICODERM CQ - dosed in mg/24 hours) patch 21 mg  21 mg Transdermal Q0600 Mickie D. Adams, PA      . DISCONTD: QUEtiapine (SEROQUEL) tablet 25 mg  25 mg Oral TID Mike Craze, MD  Facility-Administered Medications Ordered in Other Encounters  Medication Dose Route Frequency Provider Last Rate Last Dose  . DISCONTD: 0.9 %  sodium chloride infusion   Intravenous Continuous Kela Millin, MD 75 mL/hr at 08/17/12 1533    . DISCONTD: bisacodyl (DULCOLAX) EC tablet 5 mg  5 mg Oral Daily PRN Kela Millin, MD      . DISCONTD: calcium-vitamin D (OSCAL WITH D) 500-200 MG-UNIT per tablet 1 tablet  1 tablet Oral Daily Kela Millin, MD   1 tablet at 08/18/12 0901  . DISCONTD: enoxaparin (LOVENOX) injection 40 mg  40 mg Subcutaneous Q24H Kela Millin, MD   40 mg at 08/17/12 2058  . DISCONTD: multivitamin with minerals tablet 1 tablet  1 tablet Oral Daily Kela Millin, MD   1 tablet at 08/18/12 0901  . DISCONTD: ondansetron (ZOFRAN)  injection 4 mg  4 mg Intravenous Q6H PRN Adeline C Viyuoh, MD      . DISCONTD: ondansetron (ZOFRAN) tablet 4 mg  4 mg Oral Q6H PRN Adeline C Viyuoh, MD      . DISCONTD: QUEtiapine (SEROQUEL) tablet 400 mg  400 mg Oral QHS Kela Millin, MD   400 mg at 08/17/12 2100  . DISCONTD: sodium chloride 0.9 % injection 3 mL  3 mL Intravenous Q12H Kela Millin, MD   3 mL at 08/17/12 2100    Lab Results:  Results for orders placed during the hospital encounter of 08/16/12 (from the past 48 hour(s))  BASIC METABOLIC PANEL     Status: Abnormal   Collection Time   08/18/12  4:50 AM      Component Value Range Comment   Sodium 137  135 - 145 mEq/L    Potassium 3.6  3.5 - 5.1 mEq/L    Chloride 104  96 - 112 mEq/L    CO2 24  19 - 32 mEq/L    Glucose, Bld 95  70 - 99 mg/dL    BUN 10  6 - 23 mg/dL    Creatinine, Ser 4.09  0.50 - 1.10 mg/dL    Calcium 8.2 (*) 8.4 - 10.5 mg/dL    GFR calc non Af Amer >90  >90 mL/min    GFR calc Af Amer >90  >90 mL/min     Physical Findings: AIMS: Facial and Oral Movements Muscles of Facial Expression: None, normal Lips and Perioral Area: None, normal Jaw: None, normal Tongue: None, normal,Extremity Movements Upper (arms, wrists, hands, fingers): None, normal Lower (legs, knees, ankles, toes): None, normal, Trunk Movements Neck, shoulders, hips: None, normal, Overall Severity Severity of abnormal movements (highest score from questions above): None, normal Incapacitation due to abnormal movements: None, normal Patient's awareness of abnormal movements (rate only patient's report): No Awareness, Dental Status Current problems with teeth and/or dentures?: No Does patient usually wear dentures?: No  CIWA:    COWS:     Treatment Plan Summary: Daily contact with patient to assess and evaluate symptoms and progress in treatment Medication management Mood/anxiety less than 3/10 where the scale is 1 is the best and 10 is the worst No suicidal or homicidal thoughts  for at least 48 hours.  Plan: Admit, continue Seroquel for mood control and sedation. Add day time doses to see how that helps, Add Vistaril and Inderal to see if either of them help. Add something for congestion as she notes that too.  Order thyroid panel and Vitamin D level.  Discussed the risks, benefits, and probable clinical course  with and without treatment.  Pt is agreeable to the current course of treatment.  [name] 08/19/2012, 1:40 PM

## 2012-08-19 NOTE — H&P (Signed)
Psychiatric Admission Assessment Adult  Patient Identification:  [name]  Date of Evaluation:  08/19/2012  Chief Complaint:  MDD  History of Present Illness: This is a 51 year old caucasian female, admitted to [facility] from the [facility] ED with reports of suicide attempt by over dose on Asprin and Benadryl.  Patient has history of prior suicide attempt x 4.  Patient reports, "My sister took me to [facility] on the 24th of this month.  I had taken to much Asprin and Benadryl pills.  I was tired of feeling tired. I am just not feeling rested.  I feel exhausted.  I work second shift.  I think  I'm both depressed and tired.  My depression has been going on and off for while, not so much suicidal, I was rather trying to sleep and get some rest.  After I took the medicines my mother came home and found me in bed.  She called my sister and she came by and took me to the hospital".    ROS Per ED providers: Constitutional: Vital signs reviewed. Patient is a well-developed and well-nourished in no acute distress and cooperative with exam. Alert and oriented x3.  Head: Normocephalic and atraumatic  Mouth: no erythema or exudates, MMM  Eyes: PERRL, EOMI, conjunctivae normal, No scleral icterus.  Neck: Supple, Trachea midline normal ROM, No JVD, mass, thyromegaly, or carotid bruit present.  Cardiovascular: RRR, S1 normal, S2 normal, no MRG, pulses symmetric and intact bilaterally  Pulmonary/Chest: CTAB, no wheezes, rales, or rhonchi  Abdominal: Soft. Non-tender, non-distended, bowel sounds are normal, no masses, organomegaly, or guarding present.  GU: no CVA tenderness Musculoskeletal: No joint deformities, erythema, or stiffness, ROM full and no non tender  Extremities: No cyanosis and no edema Neurological: A&O x3, Strength is normal and symmetric bilaterally, cranial nerve II-XII are grossly intact, no focal motor deficit, sensory intact to light touch bilaterally.  Skin: Warm, dry  and intact. No rash, cyanosis, or clubbing.  Psychiatric:flat affect.   Mood Symptoms:  Low energy, extreme fatigue  Depression Symptoms:  insomnia, difficulty concentrating, suicidal thoughts with specific plan,  (Hypo) Manic Symptoms:  Distractibility,  Anxiety Symptoms:  Excessive Worry,  Psychotic Symptoms:  Hallucinations: None  PTSD Symptoms: Had a traumatic exposure:  No specific traumatic event reported.  Past Psychiatric History: Diagnosis:Bipolar disorder, unspecified, Salicylate overdose   Hospitalizations: [facility]  Outpatient Care: [facility]  Substance Abuse Care: None reported  Self-Mutilation: None reported  Suicidal Attempts: "Yes, by overdose on aspirin and benadryl"  Violent Behaviors: None reported   Past Medical History:  History reviewed. No pertinent past medical history.  Allergies:   Allergies  Allergen Reactions  . Sulfa Antibiotics    PTA Medications: Prescriptions prior to admission  Medication Sig Dispense Refill  . calcium-vitamin D (OSCAL WITH D) 500-200 MG-UNIT per tablet Take 1 tablet by mouth daily.      . Multiple Vitamin (MULTIVITAMIN WITH MINERALS) TABS Take 1 tablet by mouth daily.      . QUEtiapine (SEROQUEL) 400 MG tablet Take 400 mg by mouth at bedtime.        Substance Abuse History in the last 12 months: Substance Age of 1st Use Last Use Amount Specific Type  Nicotine "I don't drink alcohol, smoke cigarettes and use drugs"     Alcohol      Cannabis      Opiates      Cocaine      Methamphetamines  LSD      Ecstasy      Benzodiazepines      Caffeine      Inhalants      Others:                         Consequences of Substance Abuse: Medical Consequences:  Liver damage, Possible death by overdose Legal Consequences:  Arrests, jail time, Loss of driving privilege. Family Consequences:  Family discord, divorce and or separation.  Social History: Current Place of Residence: [place], [place]    Place of Birth:  [place]  Family Members: "My mother"  Marital Status:  Single  Children: 2  Sons: 1  Daughters: 1  Relationships: Single  Education:  [employer] Problems/Performance: None  Religious Beliefs/Practices: None  History of Abuse (Emotional/Phsycial/Sexual): None reported  Occupational Experiences: employed  Hotel manager History:  None.  Legal History: None reported  Hobbies/Interests: None reported  Family History:  History reviewed. No pertinent family history.  Mental Status Examination/Evaluation: Objective:  Appearance: Casual  Eye Contact::  Good  Speech:  Clear and Coherent  Volume:  Normal  Mood:   "My mood is good, but I'm worried"  Affect:  Flat  Thought Process:  Coherent  Orientation:  Full  Thought Content:  Rumination  Suicidal Thoughts:  No  Homicidal Thoughts:  No  Memory:  Immediate;   Good Recent;   Good Remote;   Good  Judgement:  Fair  Insight:  Lacking  Psychomotor Activity:  Normal  Concentration:  Good  Recall:  Good  Akathisia:  No  Handed:  Right  AIMS (if indicated):     Assets:  Desire for Improvement  Sleep:  Number of Hours: 5.25     Laboratory/X-Ray: None Psychological Evaluation(s)      Assessment:    AXIS I:  Bipolar diosrder  AXIS II:  Deferred  AXIS III:  History reviewed. No pertinent past medical history.  AXIS IV:  occupational problems  AXIS V:  11-20 some danger of hurting self or others possible OR occasionally fails to maintain minimal personal hygiene OR gross impairment in communication  Treatment Plan/Recommendations: Admit for safety and stabilization. Review and reinstate any pertinent home medications for other medical issues. Continue current treatment plan already in progress. Initiate Lisinopril 10 mg Q daily for increased blood pressure. Group counseling sessions and activities.  Treatment Plan Summary: Daily contact with patient to assess and evaluate symptoms and progress in  treatment Medication management  Current Medications:  Current Facility-Administered Medications  Medication Dose Route Frequency Provider Last Rate Last Dose  . acetaminophen (TYLENOL) tablet 650 mg  650 mg Oral Q6H PRN Mickie D. Adams, PA      . alum & mag hydroxide-simeth (MAALOX/MYLANTA) 200-200-20 MG/5ML suspension 30 mL  30 mL Oral Q4H PRN Mickie D. Adams, PA      . calcium-vitamin D (OSCAL WITH D) 500-200 MG-UNIT per tablet 1 tablet  1 tablet Oral Daily Mickie D. Adams, PA   1 tablet at 08/19/12 0750  . hydrOXYzine (ATARAX/VISTARIL) tablet 25 mg  25 mg Oral TID PRN Mike Craze, MD   25 mg at 08/19/12 1414  . lisinopril (PRINIVIL,ZESTRIL) tablet 10 mg  10 mg Oral Daily Sanjuana Kava, NP      . loratadine (CLARITIN) tablet 10 mg  10 mg Oral Daily Mike Craze, MD      . magnesium hydroxide (MILK OF MAGNESIA) suspension 30 mL  30  mL Oral Daily PRN Mickie D. Adams, PA      . multivitamin with minerals tablet 1 tablet  1 tablet Oral Daily Mickie D. Adams, PA   1 tablet at 08/19/12 0750  . propranolol (INDERAL) tablet 10 mg  10 mg Oral QID Mike Craze, MD      . QUEtiapine (SEROQUEL) tablet 12.5 mg  12.5 mg Oral TID Mike Craze, MD      . QUEtiapine (SEROQUEL) tablet 400 mg  400 mg Oral QHS Mickie D. Adams, PA   400 mg at 08/18/12 2216  . DISCONTD: hydrOXYzine (ATARAX/VISTARIL) tablet 50 mg  50 mg Oral QHS PRN,MR X 1 Mickie D. Adams, PA   50 mg at 08/18/12 2050  . DISCONTD: nicotine (NICODERM CQ - dosed in mg/24 hours) patch 21 mg  21 mg Transdermal Q0600 Mickie D. Adams, PA      . DISCONTD: QUEtiapine (SEROQUEL) tablet 25 mg  25 mg Oral TID Mike Craze, MD       Facility-Administered Medications Ordered in Other Encounters  Medication Dose Route Frequency Provider Last Rate Last Dose  . DISCONTD: 0.9 %  sodium chloride infusion   Intravenous Continuous Kela Millin, MD 75 mL/hr at 08/17/12 1533    . DISCONTD: bisacodyl (DULCOLAX) EC tablet 5 mg  5 mg Oral Daily PRN Kela Millin, MD      . DISCONTD: calcium-vitamin D (OSCAL WITH D) 500-200 MG-UNIT per tablet 1 tablet  1 tablet Oral Daily Kela Millin, MD   1 tablet at 08/18/12 0901  . DISCONTD: enoxaparin (LOVENOX) injection 40 mg  40 mg Subcutaneous Q24H Kela Millin, MD   40 mg at 08/17/12 2058  . DISCONTD: multivitamin with minerals tablet 1 tablet  1 tablet Oral Daily Kela Millin, MD   1 tablet at 08/18/12 0901  . DISCONTD: ondansetron (ZOFRAN) injection 4 mg  4 mg Intravenous Q6H PRN Adeline C Viyuoh, MD      . DISCONTD: ondansetron (ZOFRAN) tablet 4 mg  4 mg Oral Q6H PRN Adeline C Viyuoh, MD      . DISCONTD: QUEtiapine (SEROQUEL) tablet 400 mg  400 mg Oral QHS Kela Millin, MD   400 mg at 08/17/12 2100  . DISCONTD: sodium chloride 0.9 % injection 3 mL  3 mL Intravenous Q12H Kela Millin, MD   3 mL at 08/17/12 2100    Observation Level/Precautions:  Q 15 minute checks for safety  Laboratory:  Reviewed ED lab reports and findings on file.  Psychotherapy:  Group counseling sessions  Medications: See medication lists   Routine PRN Medications:  Yes  Consultations: None indicated   Discharge Concerns:  Safety  Other:     Armandina Stammer I 9/27/20132:22 PM

## 2012-08-19 NOTE — Progress Notes (Signed)
  D) Patient quiet and cooperative upon my assessment. Patient states slept "well," and  appetite is " good." Patient rates depression as   7/10. Patient denies SI/HI, denies A/V hallucinations.   A) Patient offered support and encouragement, patient encouraged to discuss feelings/concerns with staff. Patient verbalized understanding. Patient monitored Q15 minutes for safety. Patient met with MD and treatment team to discuss today's goals and plan of care.  R) Patient active on unit, attending groups in day room and meals in dining room.  Patient taking medications as ordered. Patient  Has a plan to continue to "get good sleep/rest" once she is discharged from [facility]. Will continue to monitor.

## 2012-08-19 NOTE — [facility] Counselor (Signed)
Adult Comprehensive Assessment  Patient ID: [name], female   DOB: [birth year], 51 y.o.   MRN: [id number]  Information Source: Information source: Patient  Current Stressors:  Educational / Learning stressors: no stressors Employment / Job issues: stressful situations at work Family Relationships: boyfriend recently broke up with her Surveyor, quantity / Lack of resources (include bankruptcy): no Web designer / Lack of housing: cannot return to boyfriend's apartment Physical health (include injuries & life threatening diseases): no stressors Social relationships: not many friends Substance abuse: no use in 6 months  Living/Environment/Situation:  Living Arrangements: Parent Living conditions (as described by patient or guardian): lives with mother How long has patient lived in current situation?: 2.5 years What is atmosphere in current home: Comfortable;Loving  Family History:  Marital status: Divorced Divorced, when?: 3-4 years  What types of issues is patient dealing with in the relationship?: ex-husband has been contacting her recently Does patient have children?: Yes How many children?: 2  How is patient's relationship with their children?: children are adults and live in [place], good relationships  Childhood History:  By whom was/is the patient raised?: Mother Additional childhood history information: father died when she was 41, uncles who committed suicide, brother has diagnosis of Bipolar and family history of alcohol Description of patient's relationship with caregiver when they were a child: good with mother  Patient's description of current relationship with people who raised him/her: mother is her main support Does patient have siblings?: Yes Number of Siblings: 3  Description of patient's current relationship with siblings: 2 b, 1 s- good relationships Did patient suffer any verbal/emotional/physical/sexual abuse as a child?: No Did patient suffer from severe  childhood neglect?: No Has patient ever been sexually abused/assaulted/raped as an adolescent or adult?: No Was the patient ever a victim of a crime or a disaster?: No Witnessed domestic violence?: No Has patient been effected by domestic violence as an adult?: No  Education:  Highest grade of school patient has completed: high school graduate Currently a Consulting civil engineer?: No Learning disability?: No  Employment/Work Situation:   Employment situation: Employed 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 How long has patient been employed?: 4 years Patient's job has been impacted by current illness: No Has patient ever been in the [employer]?: No Has patient ever served in Buyer, retail?: No  Financial Resources:   Surveyor, quantity resources: Income from employment Does patient have a representative payee or guardian?: No  Alcohol/Substance Abuse:   What has been your use of drugs/alcohol within the last 12 months?: no alcohol in 6 months If attempted suicide, did drugs/alcohol play a role in this?: Yes (benadryl and aspirin) Alcohol/Substance Abuse Treatment Hx: Denies past history If yes, describe treatment: n/a Has alcohol/substance abuse ever caused legal problems?: No  Social Support System:   Forensic psychologist System: Poor Describe Community Support System: mom, sister  Type of faith/religion: Christian How does patient's faith help to cope with current illness?: goes to church and small groups there, believes in God and has faith he will help her through  Leisure/Recreation:   Leisure and Hobbies: cross-stitching and crocheting  Strengths/Needs:   What things does the patient do well?: a good person, gets along well with others, kind In what areas does patient struggle / problems for patient: meds need adjusting, took a large amount of Aspirin and Benadryl "out of desperation", feeling unrested and agitated, unsure whether she was actually suicidal  Discharge Plan:    Does patient have access to transportation?:  Yes Will patient be returning to same living situation after discharge?: Yes Currently receiving community mental health services: Yes (From Whom) Raynelle Fanning [facility]) If no, would patient like referral for services when discharged?: No Does patient have financial barriers related to discharge medications?: No  Summary/Recommendations:   Summary and Recommendations (to be completed by the evaluator): [name] is a 51 year old divorced female diagnosed with Major Depressive Disorder. She reports that this was not a suicide attempt, and the three other ovedoses she has had this year were not either. Reports she overdoses just to get a good restful sleep. [name] denies that there are any stressors in her life, although her boyfriend just broke up with her, and she cannot return to the home with him or he will get a 50-B on her. She states that she does not want to die  but does not seem bothered by the fact that she could easily have died due to her actions. [name] would benefit from crisis stabilization, medication evaluation, psychoed groups for coping skills, therapy groups for processing thoughts/feelings/experiences, and case management for discharge planning.   Lyn Hollingshead, Lyndee Hensen. 08/19/2012

## 2012-08-19 NOTE — Tx Team (Signed)
Interdisciplinary Treatment Plan Update (Adult)  Date:  08/19/2012  Time Reviewed:  9:57 AM   Progress in Treatment: Attending groups:   Yes   Participating in groups:  Yes Taking medication as prescribed:  Yes Tolerating medication:  Yes Family/Significant othe contact made: Counselor to follow up on collateral contact Patient understands diagnosis:  Yes Discussing patient identified problems/goals with staff: Yes Medical problems stabilized or resolved: Yes Denies suicidal/homicidal ideation: No but contracts for safety Issues/concerns per patient self-inventory:  Other:  New problem(s) identified:  Reason for Continuation of Hospitalization: Anxiety Depression Medication stabilization Suicidal ideation  Interventions implemented related to continuation of hospitalization: Medication Management; safety checks q 15 mins; coping skills development  Additional comments:  Estimated length of stay: 2-4 days  Discharge Plan:  Home with outpatient follow up  New goal(s):  Review of initial/current patient goals per problem list:    1.  Goal(s): Eliminate SI/other thoughts of self harm   Met:  No  Target date: d/c  As evidenced by: Patient no longer endorsing SI/HI or other thoughts of self harm.    2.  Goal (s): Reduce depression/anxiety (rates depression at six and anxiety at five)   Met:  No  Target date: d/c  As evidenced by: Patient will rate symptoms at four or below    3.  Goal(s): .stabilize on meds   Met:  No  Target date: d/c  As evidenced by: Patient will report being stable on medications - symptoms have decreased    4.  Goal(s):  Refer for outpatient follow up   Met:  No  Target date: d/c  As evidenced by: Follow up appointment will be scheduled    Attendees: Patient:  [name] 08/19/2012 9:57 AM  Nursing:  Nestor Ramp, RN 08/19/2012 9:59 AM  Physician:  Orson Aloe, MD 08/19/2012 9:57 AM   Nursing:  Liborio Nixon, RN   08/19/2012 9:57 AM   CaseManager:  Juline Patch, LCSW 08/19/2012 9:57 AM   Counselor:  Angus Palms, LCSW 08/19/2012 9:57 AM   Other:  Foye Clock, MSW Intern    08/19/2012 9:59 AM

## 2012-08-19 NOTE — Progress Notes (Signed)
Patient seen during d/c planning group and treatment team.  She reports admitting to the hospital after taking a large dose of aspirin and benedryl because she was desperate and wanted to rest. She currently endorsing off/on SI but contracts for safety  She shared she and her mother share a home and she will return to the home at discharge.  She is followed outpatient by [facility].

## 2012-08-19 NOTE — [facility] Suicide Risk Assessment (Signed)
Suicide Risk Assessment  Admission Assessment     Nursing information obtained from:    Demographic factors:  Divorced or widowed;Caucasian Current Mental Status:  Suicide plan (took OD) Loss Factors:  NA Historical Factors:  Prior suicide attempts;Family history of suicide;Family history of mental illness or substance abuse Risk Reduction Factors:  Sense of responsibility to family;Positive social support;Positive therapeutic relationship  CLINICAL FACTORS:   Severe Anxiety and/or Agitation Bipolar Disorder:   Bipolar II Previous Psychiatric Diagnoses and Treatments  COGNITIVE FEATURES THAT CONTRIBUTE TO RISK:  Closed-mindedness Thought constriction (tunnel vision)    SUICIDE RISK:   Moderate:  Frequent suicidal ideation with limited intensity, and duration, some specificity in terms of plans, no associated intent, good self-control, limited dysphoria/symptomatology, some risk factors present, and identifiable protective factors, including available and accessible social support.  Reason for hospitalization: .took a handful of aspirin and 10 -15 Benadryl the night before coming to the hospital, this is the 4th time in a year for her to do this. Diagnosis:  Axis I: Bipolar, Depressed, Generalized Anxiety Disorder and Social Anxiety Axis II: Deferred Axis III: History reviewed. No pertinent past medical history. Axis IV: other psychosocial or environmental problems Axis V: 21-30 behavior considerably influenced by delusions or hallucinations OR serious impairment in judgment, communication OR inability to function in almost all areas  ADL's:  Intact  Sleep: Good  Appetite:  Good  Suicidal Ideation:  Prior to hospital pt took handful of aspirin and 10-15 Benadryl to help her go to sleep for the 4th time this year. Homicidal Ideation:  Pt denies any thoughts, plans, intent of homicide  AEB (as evidenced by):per pt report  Mental Status Examination/Evaluation: Objective:   Appearance: Casual  Eye Contact::  Good  Speech:  Clear and Coherent  Volume:  Normal  Mood:  Anxious, Depressed, Irritable and Worthless  Affect:  Congruent  Thought Process:  Coherent  Orientation:  Full  Thought Content:  WDL  Suicidal Thoughts:  Yes.  without intent/plan  Homicidal Thoughts:  No  Memory:  Immediate;   Fair Recent;   Fair Remote;   Fair  Judgement:  Impaired  Insight:  Lacking  Psychomotor Activity:  Normal  Concentration:  Fair  Recall:  Fair  Akathisia:  No  Handed:  Left  AIMS (if indicated):     Assets:  Communication Skills Desire for Improvement  Sleep:  Number of Hours: 5.25    Vital Signs:Blood pressure 130/92, pulse 83, temperature 96.9 F (36.1 C), temperature source Oral, resp. rate 24, height 5' (1.524 m), weight 56.246 kg (124 lb), last menstrual period 07/18/2012. Current Medications: Current Facility-Administered Medications  Medication Dose Route Frequency Provider Last Rate Last Dose  . acetaminophen (TYLENOL) tablet 650 mg  650 mg Oral Q6H PRN Mickie D. Adams, PA      . alum & mag hydroxide-simeth (MAALOX/MYLANTA) 200-200-20 MG/5ML suspension 30 mL  30 mL Oral Q4H PRN Mickie D. Adams, PA      . calcium-vitamin D (OSCAL WITH D) 500-200 MG-UNIT per tablet 1 tablet  1 tablet Oral Daily Mickie D. Adams, PA   1 tablet at 08/19/12 0750  . hydrOXYzine (ATARAX/VISTARIL) tablet 25 mg  25 mg Oral TID PRN Mike Craze, MD      . loratadine (CLARITIN) tablet 10 mg  10 mg Oral Daily Mike Craze, MD      . magnesium hydroxide (MILK OF MAGNESIA) suspension 30 mL  30 mL Oral Daily PRN Mickie D. Pernell Dupre, PA      .  multivitamin with minerals tablet 1 tablet  1 tablet Oral Daily Mickie D. Adams, PA   1 tablet at 08/19/12 0750  . propranolol (INDERAL) tablet 10 mg  10 mg Oral QID Mike Craze, MD      . QUEtiapine (SEROQUEL) tablet 12.5 mg  12.5 mg Oral TID Mike Craze, MD      . QUEtiapine (SEROQUEL) tablet 400 mg  400 mg Oral QHS Mickie D. Adams, PA    400 mg at 08/18/12 2216  . DISCONTD: hydrOXYzine (ATARAX/VISTARIL) tablet 50 mg  50 mg Oral QHS PRN,MR X 1 Mickie D. Adams, PA   50 mg at 08/18/12 2050  . DISCONTD: nicotine (NICODERM CQ - dosed in mg/24 hours) patch 21 mg  21 mg Transdermal Q0600 Mickie D. Adams, PA      . DISCONTD: QUEtiapine (SEROQUEL) tablet 25 mg  25 mg Oral TID Mike Craze, MD       Facility-Administered Medications Ordered in Other Encounters  Medication Dose Route Frequency Provider Last Rate Last Dose  . DISCONTD: 0.9 %  sodium chloride infusion   Intravenous Continuous Kela Millin, MD 75 mL/hr at 08/17/12 1533    . DISCONTD: bisacodyl (DULCOLAX) EC tablet 5 mg  5 mg Oral Daily PRN Kela Millin, MD      . DISCONTD: calcium-vitamin D (OSCAL WITH D) 500-200 MG-UNIT per tablet 1 tablet  1 tablet Oral Daily Kela Millin, MD   1 tablet at 08/18/12 0901  . DISCONTD: enoxaparin (LOVENOX) injection 40 mg  40 mg Subcutaneous Q24H Kela Millin, MD   40 mg at 08/17/12 2058  . DISCONTD: multivitamin with minerals tablet 1 tablet  1 tablet Oral Daily Kela Millin, MD   1 tablet at 08/18/12 0901  . DISCONTD: ondansetron (ZOFRAN) injection 4 mg  4 mg Intravenous Q6H PRN Adeline C Viyuoh, MD      . DISCONTD: ondansetron (ZOFRAN) tablet 4 mg  4 mg Oral Q6H PRN Adeline C Viyuoh, MD      . DISCONTD: QUEtiapine (SEROQUEL) tablet 400 mg  400 mg Oral QHS Kela Millin, MD   400 mg at 08/17/12 2100  . DISCONTD: sodium chloride 0.9 % injection 3 mL  3 mL Intravenous Q12H Kela Millin, MD   3 mL at 08/17/12 2100    Lab Results:  Results for orders placed during the hospital encounter of 08/16/12 (from the past 48 hour(s))  BASIC METABOLIC PANEL     Status: Abnormal   Collection Time   08/18/12  4:50 AM      Component Value Range Comment   Sodium 137  135 - 145 mEq/L    Potassium 3.6  3.5 - 5.1 mEq/L    Chloride 104  96 - 112 mEq/L    CO2 24  19 - 32 mEq/L    Glucose, Bld 95  70 - 99 mg/dL    BUN 10  6 -  23 mg/dL    Creatinine, Ser 8.46  0.50 - 1.10 mg/dL    Calcium 8.2 (*) 8.4 - 10.5 mg/dL    GFR calc non Af Amer >90  >90 mL/min    GFR calc Af Amer >90  >90 mL/min     Physical Findings: AIMS: Facial and Oral Movements Muscles of Facial Expression: None, normal Lips and Perioral Area: None, normal Jaw: None, normal Tongue: None, normal,Extremity Movements Upper (arms, wrists, hands, fingers): None, normal Lower (legs, knees, ankles, toes): None, normal, Trunk Movements  Neck, shoulders, hips: None, normal, Overall Severity Severity of abnormal movements (highest score from questions above): None, normal Incapacitation due to abnormal movements: None, normal Patient's awareness of abnormal movements (rate only patient's report): No Awareness, Dental Status Current problems with teeth and/or dentures?: No Does patient usually wear dentures?: No  CIWA:    COWS:     Risk: Risk of harm to self is elevated by her anxiety and bipolar disorder  Risk of harm to others is minimal in that she has not been involved in fights or had any legal charges filed on her.  Treatment Plan Summary: Daily contact with patient to assess and evaluate symptoms and progress in treatment Medication management Mood/anxiety less than 3/10 where the scale is 1 is the best and 10 is the worst No suicidal or homicidal thoughts for at least 48 hours.  Plan: Admit, continue Seroquel for mood control and sedation. Add day time doses to see how that helps, Add Vistaril and Inderal to see if either of them help. Add something for congestion as she notes that too.  Order thyroid panel and Vitamin D level.  Discussed the risks, benefits, and probable clinical course with and without treatment.  Pt is agreeable to the current course of treatment. We will continue on q. 15 checks the unit protocol. At this time there is no clinical indication for one-to-one observation as patient contract for safety and presents little  risk to harm themself and others.  We will increase collateral information. I encourage patient to participate in group milieu therapy. Pt will be seen in treatment team soon for further treatment and appropriate discharge planning. Please see history and physical note for more detailed information ELOS: 3 to 5 days.   Tadeo Besecker 08/19/2012, 1:41 PM

## 2012-08-19 NOTE — Progress Notes (Signed)
Psychoeducational Group Note  Date:  08/19/2012 Time:  1100  Group Topic/Focus:  Early Warning Signs:   The focus of this group is to help patients identify signs or symptoms they exhibit before slipping into an unhealthy state or crisis.  Participation Level:  Active  Participation Quality:  Appropriate, Attentive and Sharing  Affect:  Appropriate  Cognitive:  Appropriate  Insight:  Good  Engagement in Group:  Good  Additional Comments:  Pt attended Early Warning Signs Group. Pt encouraged to share an episode that brought her into the hospital. Pt stated what she was thinking while in crisis. Pt explained signs that were beginning to become unhealthy during the time of crisis. Pt stated warning signs of relapse in categories of behavior, attitude, feeling, and thought changes, and an example to follow. Pt discussed how relapsing  it effected relationships and personal life.    Karleen Hampshire Brittini 08/19/2012, 1:23 PM

## 2012-08-20 DIAGNOSIS — F411 Generalized anxiety disorder: Secondary | ICD-10-CM

## 2012-08-20 LAB — TSH: TSH: 1.588 u[IU]/mL (ref 0.350–4.500)

## 2012-08-20 LAB — VITAMIN D 25 HYDROXY (VIT D DEFICIENCY, FRACTURES): Vit D, 25-Hydroxy: 60 ng/mL (ref 30–89)

## 2012-08-20 LAB — T4, FREE: Free T4: 1.26 ng/dL (ref 0.80–1.80)

## 2012-08-20 LAB — T3, FREE: T3, Free: 2.9 pg/mL (ref 2.3–4.2)

## 2012-08-20 NOTE — Progress Notes (Signed)
[facility] Group Notes:  (Counselor/Nursing/MHT/Case Management/Adjunct)  08/20/2012 3:40 AM  Type of Therapy:  Psychoeducational Skills  Participation Level:  Minimal  Participation Quality:  Attentive  Affect:  Depressed  Cognitive:  Appropriate  Insight:  Good  Engagement in Group:  Limited  Engagement in Therapy:  Limited  Modes of Intervention:  Education  Summary of Progress/Problems: Patient  Stated that she was having a good day until she found out that a family member had passed away. She verbalizes that she learned more about her diagnosis today. Her goal for tomorrow is to think more positively.    Djuana Littleton S 08/20/2012, 3:40 AM

## 2012-08-20 NOTE — Progress Notes (Signed)
[facility] Adult Inpatient Family/Significant Other Suicide Prevention Education  Suicide Prevention Education:  Education Completed; [name]- [phone].'s mother has been identified by the patient as the family member/significant other with whom the patient will be residing, and identified as the person(s) who will aid the patient in the event of a mental health crisis (suicidal ideations/suicide attempt).  With written consent from the patient, the family member/significant other has been provided the following suicide prevention education, prior to the and/or following the discharge of the patient.  The suicide prevention education provided includes the following:  Suicide risk factors  Suicide prevention and interventions  National Suicide Hotline telephone number  [facility]  assessment telephone number  [facility] Emergency Assistance 911  [facility] and/or Residential Mobile Crisis Unit telephone number  Request made of family/significant other to:  Remove weapons (e.g., guns, rifles, knives), all items previously/currently identified as safety concern.  Pt.'s mother states that there are no guns or weapons in the home.   Remove drugs/medications (over-the-counter, prescriptions, illicit drugs), all items previously/currently identified as a safety concern. Pt.'s has past history of overdose and pt.'s mother states she locks up the pt.'s medication.  Pt.'s mother states that she is going out of town due to death in family and that the pt. Cannot be d/c until she returns home. Pt.'s mother will return nest Saturday town. The pt.'s mother can be reached at [phone] -Cell or Pt.'s sister's number where mother can be reached [phone].  The family member/significant other verbalizes understanding of the suicide prevention education information provided.  The family member/significant other agrees to remove the items of safety concern listed  above.  [name] 08/20/2012, 10:52 AM

## 2012-08-20 NOTE — Progress Notes (Signed)
Patient ID: [name], female   DOB: [birth year], 51 y.o.   MRN: [id number]   [facility] Group Notes:  (Counselor/Nursing/MHT/Case Management/Adjunct)  08/20/2012 1:15 PM  Type of Therapy:  Group Therapy, Dance/Movement Therapy   Participation Level:  Minimal  Participation Quality:  Appropriate  Affect:  Appropriate  Cognitive:  Appropriate  Insight:  Limited  Engagement in Group:  Limited  Engagement in Therapy:  Limited  Modes of Intervention:  Clarification, Problem-solving, Role-play, Socialization and Support  Summary of Progress/Problems: Therapist and group members discussed self-sabotaging behaviors as well as healthy coping skills. Group members shared one positive thing to be thankful for today and how it is important to focus on the present. Pt shared that she is thankful for her family.      Cassidi Long 08/20/2012. 3:18 PM

## 2012-08-20 NOTE — Progress Notes (Signed)
Patient attended group this evening. Writer spoke with patient concerning her scheduled medications and she is agreeable to taking them. Writer inquired how her day has been and patient reports that she was informed her aunt passed away today. Patient offered support and encouragement and asked if there was anything that she needed and patient reported that she was fine. Patient appears very sad but not tearful and is soft spoken. Patient currently denies having pain, -si,hi/a/v hallucinations, safety maintained on unit, will continue to monitor with 15 minute checks.

## 2012-08-20 NOTE — Progress Notes (Signed)
D) Pt rates her depression, hopelessness and anxiety all at a 5. Has attended the program and interacts with her peers appropriately. States that she is getting a lot out of the program. Denies SI and HI. A) Given support reassurance and praise. Given encouragement to work on her booklet this evening and to be able to identify positives about herself.  R). States that she will work on her booklet today.

## 2012-08-20 NOTE — Progress Notes (Signed)
Psychoeducational Group Note  Date:  08/20/2012 Time:  1515  Group Topic/Focus:  Coping With Mental Health Crisis:   The purpose of this group is to help patients identify strategies for coping with mental health crisis.  Group discusses possible causes of crisis and ways to manage them effectively.  Participation Level:  Did Not Attend  Participation Quality:    Affect:    Cognitive:    Insight:    Engagement in Group:    Additional Comments: asleep in bed  [name], [name] 08/20/2012, 6:59 PM

## 2012-08-20 NOTE — Progress Notes (Signed)
Psychoeducational Group Note  Date: 08/20/2012 Time:  1015  Group Topic/Focus:  Identifying Needs:   The focus of this group is to help patients identify their personal needs that have been historically problematic and identify healthy behaviors to address their needs.  Participation Level:  Active  Participation Quality:  Appropriate  Affect:  Anxious and Appropriate  Cognitive:  Appropriate  Insight:  Good  Engagement in Group:  Good  Additional Comments:    Dione Housekeeper

## 2012-08-20 NOTE — Progress Notes (Signed)
[facility] MD Progress Note  08/20/2012 4:38 PM  S: "I am doing pretty good. I feel better too. I have to now figure out how I will take my medicines at home. This is a struggle for me sometimes. I hope I wound forget to take them"  Diagnosis:  Axis I: Generalized Anxiety Disorder and Bipolar disorder, unspecified Axis II: Deferred Axis III: History reviewed. No pertinent past medical history. Axis IV: other psychosocial or environmental problems Axis V: 51-60 moderate symptoms  ADL's:  Intact  Sleep: Good  Appetite:  Good  Suicidal Ideation: "No" Plan:  No Intent:  No Means:  no Homicidal Ideation:  Plan:  No Intent:  No Means:  No  AEB (as evidenced by): per patient's reports.  Mental Status Examination/Evaluation: Objective:  Appearance: Casual  Eye Contact::  Good  Speech:  Clear and Coherent  Volume:  Normal  Mood:  "pretty good"  Affect:  Appropriate  Thought Process:  Coherent  Orientation:  Full  Thought Content:  Rumination  Suicidal Thoughts:  No  Homicidal Thoughts:  No  Memory:  Immediate;   Good Recent;   Good Remote;   Good  Judgement:  Good  Insight:  Good  Psychomotor Activity:  Normal  Concentration:  Good  Recall:  Fair  Akathisia:  No  Handed:  Right  AIMS (if indicated):     Assets:  Desire for Improvement  Sleep:  Number of Hours: 5.75    Vital Signs:Blood pressure 145/88, pulse 85, temperature 98.1 F (36.7 C), temperature source Oral, resp. rate 16, height 5' (1.524 m), weight 56.246 kg (124 lb), last menstrual period 07/18/2012. Current Medications: Current Facility-Administered Medications  Medication Dose Route Frequency Provider Last Rate Last Dose  . acetaminophen (TYLENOL) tablet 650 mg  650 mg Oral Q6H PRN Mickie D. Adams, PA      . alum & mag hydroxide-simeth (MAALOX/MYLANTA) 200-200-20 MG/5ML suspension 30 mL  30 mL Oral Q4H PRN Mickie D. Adams, PA      . calcium-vitamin D (OSCAL WITH D) 500-200 MG-UNIT per tablet 1 tablet  1  tablet Oral Daily Mickie D. Adams, PA   1 tablet at 08/20/12 1610  . hydrOXYzine (ATARAX/VISTARIL) tablet 25 mg  25 mg Oral TID PRN Mike Craze, MD   25 mg at 08/19/12 1414  . lisinopril (PRINIVIL,ZESTRIL) tablet 10 mg  10 mg Oral Daily Sanjuana Kava, NP   10 mg at 08/20/12 0823  . loratadine (CLARITIN) tablet 10 mg  10 mg Oral Daily Mike Craze, MD   10 mg at 08/20/12 0823  . magnesium hydroxide (MILK OF MAGNESIA) suspension 30 mL  30 mL Oral Daily PRN Mickie D. Adams, PA      . multivitamin with minerals tablet 1 tablet  1 tablet Oral Daily Mickie D. Adams, PA   1 tablet at 08/20/12 [phone]  . propranolol (INDERAL) tablet 10 mg  10 mg Oral QID Mike Craze, MD   10 mg at 08/20/12 1157  . QUEtiapine (SEROQUEL) tablet 12.5 mg  12.5 mg Oral TID Mike Craze, MD   12.5 mg at 08/20/12 1158  . QUEtiapine (SEROQUEL) tablet 400 mg  400 mg Oral QHS Mickie D. Adams, PA   400 mg at 08/19/12 2121    Lab Results:  Results for orders placed during the hospital encounter of 08/18/12 (from the past 48 hour(s))  VITAMIN D 25 HYDROXY     Status: Normal   Collection Time   08/19/12  7:30 PM      Component Value Range Comment   Vit D, 25-Hydroxy 60  30 - 89 ng/mL   TSH     Status: Normal   Collection Time   08/19/12  9:05 PM      Component Value Range Comment   TSH 1.588  0.350 - 4.500 uIU/mL   T4, FREE     Status: Normal   Collection Time   08/19/12  9:05 PM      Component Value Range Comment   Free T4 1.26  0.80 - 1.80 ng/dL   T3, FREE     Status: Normal   Collection Time   08/19/12  9:05 PM      Component Value Range Comment   T3, Free 2.9  2.3 - 4.2 pg/mL     Physical Findings: AIMS: Facial and Oral Movements Muscles of Facial Expression: None, normal Lips and Perioral Area: None, normal Jaw: None, normal Tongue: None, normal,Extremity Movements Upper (arms, wrists, hands, fingers): None, normal Lower (legs, knees, ankles, toes): None, normal, Trunk Movements Neck, shoulders, hips:  None, normal, Overall Severity Severity of abnormal movements (highest score from questions above): None, normal Incapacitation due to abnormal movements: None, normal Patient's awareness of abnormal movements (rate only patient's report): No Awareness, Dental Status Current problems with teeth and/or dentures?: No Does patient usually wear dentures?: No  CIWA:    COWS:     Treatment Plan Summary: Daily contact with patient to assess and evaluate symptoms and progress in treatment Medication management  Plan: For patient's worries about taking meds at home, instructed to associate taking meds during meal times, except if contraindicated. Continue current treatment plan.  Armandina Stammer I 08/20/2012, 4:38 PM

## 2012-08-20 NOTE — Progress Notes (Signed)
Patient ID: [name], female   DOB: November 03, 1961, 51 y.o.   MRN: [id number]  Pt. attended and participated in aftercare planning group. Pt. accepted information on suicide prevention, warning signs to look for with suicide and crisis line numbers to use. The pt. agreed to call crisis line numbers if having warning signs or having thoughts of suicide. Pt shared that she was feeling "alright." No S/I or H/I.

## 2012-08-21 NOTE — Progress Notes (Signed)
Psychoeducational Group Note  Date:  08/21/2012 Time:  1015  Group Topic/Focus:  Making Healthy Choices:   The focus of this group is to help patients identify negative/unhealthy choices they were using prior to admission and identify positive/healthier coping strategies to replace them upon discharge.  Participation Level:  Minimal  Participation Quality:  Appropriate  Affect:  Appropriate  Cognitive:  Alert  Insight:  Limited  Engagement in Group:  Limited  Additional Comments:  Pt was engaged throughout the group although she did not verbally volunteer information she listened attentively to all that was being said. Had excellent eye contact with all the Patients that were speaking as well as the group leaders  Dione Housekeeper 08/21/2012

## 2012-08-21 NOTE — Progress Notes (Signed)
Psychoeducational Group Note  Date:  08/21/2012 Time:  1515  Group Topic/Focus:  Conflict Resolution:   The focus of this group is to discuss the conflict resolution process and how it may be used upon discharge.  Participation Level:  Active  Participation Quality:  Appropriate, Sharing and Supportive  Affect:  Appropriate  Cognitive:  Appropriate  Insight:  Good  Engagement in Group:  Good  Additional Comments:  none  [place] M 08/21/2012, 7:52 PM

## 2012-08-21 NOTE — Progress Notes (Signed)
Patient attended group this evening and reports having had a good day but didn't really elaborate on it. Writer spoke with patient and informed her of no new medication changes and patient was hesitant about taking her medications tonight. Patient appears guarded and wanted to think about whether or not she wanted to take her medications tonight. Writer not sure why patient was hesitant about her medications and encouraged to speak with her doctor about this. Patient currently denies si/hi/a/v hallucinations. Safety maintained on unit with 15 min checks, will continue to monitor.

## 2012-08-21 NOTE — Progress Notes (Signed)
[facility] In Patient Progress Note  08/21/2012 2:36 PM [name] Jun 01, 1961 [id number]  Diagnosis: Diagnosis: Axis I: Generalized Anxiety Disorder and Bipolar disorder, unspecified  Axis II: Deferred  Axis III: History reviewed. No pertinent past medical history.  Axis IV: other psychosocial or environmental problems  Axis V: 51-60 moderate symptoms   ADL's:  Intact  Sleep:  Yes,  AEB:  Appetite: good  Suicidal Ideation: No suicidal ideation, no plan, no intent, no means.  Homicidal Ideation:  No homicidal ideation, no plan, no intent, no means.  Subjective: Met with patient who was up and active in the unit milieu.  She stated that since her mother was out of town she would need to stay with her children. (see communications note.)   height is 5' (1.524 m) and weight is 56.246 kg (124 lb). Her oral temperature is 97.5 F (36.4 C). Her blood pressure is 114/76 and her pulse is 82. Her respiration is 16.  Objective: Up and active with no new complaints.   Mental Status: Level of Consciousness:  Alert Orientation: x 3 General Appearance : disheveled Behavior:  cooperative Eye Contact:  Good Motor Behavior:  Normal Speech:  Normal Mood:  Depressed Affect:  Appropriate Anxiety Level:  Moderate Thought Process:  Intact Thought Content:  WNL Perception:  Normal Judgment:  Fair Insight:  Absent Cognition:  At least average Sleep:  Number of Hours: 6.5   Lab Results:  Results for orders placed during the hospital encounter of 08/18/12 (from the past 48 hour(s))  VITAMIN D 25 HYDROXY     Status: Normal   Collection Time   08/19/12  7:30 PM      Component Value Range Comment   Vit D, 25-Hydroxy 60  30 - 89 ng/mL   TSH     Status: Normal   Collection Time   08/19/12  9:05 PM      Component Value Range Comment   TSH 1.588  0.350 - 4.500 uIU/mL   T4, FREE     Status: Normal   Collection Time   08/19/12  9:05 PM      Component Value Range Comment   Free T4 1.26  0.80 - 1.80  ng/dL   T3, FREE     Status: Normal   Collection Time   08/19/12  9:05 PM      Component Value Range Comment   T3, Free 2.9  2.3 - 4.2 pg/mL    Labs are reviewed. Physical Findings: AIMS: Facial and Oral Movements Muscles of Facial Expression: None, normal Lips and Perioral Area: None, normal Jaw: None, normal Tongue: None, normal,Extremity Movements Upper (arms, wrists, hands, fingers): None, normal Lower (legs, knees, ankles, toes): None, normal, Trunk Movements Neck, shoulders, hips: None, normal, Overall Severity Severity of abnormal movements (highest score from questions above): None, normal Incapacitation due to abnormal movements: None, normal Patient's awareness of abnormal movements (rate only patient's report): No Awareness, Dental Status Current problems with teeth and/or dentures?: No Does patient usually wear dentures?: No  CIWA:    COWS:     Medication:  . calcium-vitamin D  1 tablet Oral Daily  . lisinopril  10 mg Oral Daily  . loratadine  10 mg Oral Daily  . multivitamin with minerals  1 tablet Oral Daily  . propranolol  10 mg Oral QID  . QUEtiapine  12.5 mg Oral TID  . QUEtiapine  400 mg Oral QHS    Treatment Plan Summary: Daily contact with patient to  assess and evaluate symptoms and progress in treatment Medication management  Plan: 1. Continue the lisinopril for hypertension. 2. Continue the Loratadine for allergies. 3. Continue the propranolol for panic,. 4. Continue Seroquel as written for anxiety and sleep. 5. Labs are reviewed. 6. Reviewed mother's call (notes) regarding not discharging the patient until later in the week due to her being out of town for the death of her sister. Rona Ravens. Rodel Glaspy PAC 08/21/2012, 2:36 PM

## 2012-08-21 NOTE — Progress Notes (Signed)
Patient ID: [name], female   DOB: 04/02/1961, 51 y.o.   MRN: [id number]  Pt. attended and participated in aftercare planning group. Pt. verbally accepted information on suicide prevention, warning signs to look for with suicide and crisis line numbers to use. Pt shared that she was feeling good but is upset about change in medication. No S/I or H/I.

## 2012-08-21 NOTE — Progress Notes (Signed)
Patient ID: [name], female   DOB: May 14, 1961, 51 y.o.   MRN: [id number]   [facility] Group Notes:  (Counselor/Nursing/MHT/Case Management/Adjunct)  08/21/2012 1:15 PM  Type of Therapy:  Group Therapy, Dance/Movement Therapy   Participation Level:  Minimal  Participation Quality:  Appropriate  Affect:  Appropriate  Cognitive:  Appropriate  Insight:  Limited  Engagement in Group:  None  Engagement in Therapy:  None  Modes of Intervention:  Clarification, Problem-solving, Role-play, Socialization and Support  Summary of Progress/Problems: Therapist and group members discussed healthy support systems and the importance of figuring out who we are and what we need. Group members shared experiences and ways they feel supported. Pt did not share much during group but appeared attentive.     Cassidi Long 08/21/2012. 3:11 PM

## 2012-08-21 NOTE — Progress Notes (Signed)
D) Pt was very cautious this morning and was fearful of taking her medications. Got confused about Propanolol and inderal. She could not understand that they were the same medication and held the medications in her mouth for awhile. She finally took the medication and when she came back to talk about it was able to understand at that point, that the medication was the same with different names. Pt's mother called and said that Pt's daughter would be able to care for her while the mother was out of town. Has attended the groups and listens to all that is being said. Denies SI and HI. A) Given support and reassurance. Provided with a 1:1 to help Pt understand that the medications were the same. Provided with active listening. R) Pt much calmer and understands.

## 2012-08-21 NOTE — Progress Notes (Signed)
[facility] Group Notes:  (Counselor/Nursing/MHT/Case Management/Adjunct)  08/21/2012 2:25 AM  Type of Therapy:  Psychoeducational Skills  Participation Level:  Minimal  Participation Quality:  Attentive  Affect:  Depressed  Cognitive:  Appropriate  Insight:  Limited  Engagement in Group:  Limited  Engagement in Therapy:  Limited  Modes of Intervention:  Education  Summary of Progress/Problems: The patient stated that she had a good day but would not elaborate any further. Her goal for tomorrow is to "have a good day".   [name] S 08/21/2012, 2:25 AM

## 2012-08-22 DIAGNOSIS — F411 Generalized anxiety disorder: Secondary | ICD-10-CM

## 2012-08-22 MED ORDER — HYDROXYZINE HCL 50 MG PO TABS
50.0000 mg | ORAL_TABLET | Freq: Three times a day (TID) | ORAL | Status: DC
  Administered 2012-08-22 – 2012-08-24 (×7): 50 mg via ORAL
  Filled 2012-08-22 (×11): qty 1

## 2012-08-22 NOTE — Progress Notes (Signed)
I agree with this note.  

## 2012-08-22 NOTE — Progress Notes (Signed)
Reviewed the progress note and agree with the treatment plan. 

## 2012-08-22 NOTE — Progress Notes (Addendum)
[facility] MD Progress Note  08/22/2012 3:36 PM  Diagnosis:  Axis I: Anxiety Disorder NOS and Bipolar Disorder un-specified  "Sometimes I get anxious and everything. When I get anxious I wanna hurt myself. I feel like I am taking too much medicine again."  ADL's:  Intact  Sleep: Good Pt states got 7 hrs sleep, but could use more  Appetite:  Good, "eating has improved."  Suicidal Ideation:  Plan:  denies Intent:  denies Means:  denies  Homicidal Ideation:  Plan:  denies Intent:  denies Means:  denies  AEB (as evidenced by):  Mental Status Examination/Evaluation: Objective:  Appearance: Fairly Groomed  Patent attorney::  Fair  Speech:  Clear and Coherent  Volume:  Decreased  Mood:  Anxious and Depressed  Affect:  Blunt  Thought Process:  Goal Directed  Orientation:  Full  Thought Content:  WDL  Suicidal Thoughts:  Yes fleeting  Homicidal Thoughts:  No  Memory:  Immediate;   Fair  Judgement:  Impaired  Insight:  Lacking  Psychomotor Activity:  fidgets constantly  Concentration:  Fair  Recall:  Fair  Akathisia:  No  Handed:    AIMS (if indicated):     Assets:  Social Support  Sleep:  Number of Hours: 5    Vital Signs:Blood pressure 121/83, pulse 83, temperature 97.9 F (36.6 C), temperature source Oral, resp. rate 18, height 5' (1.524 m), weight 56.246 kg (124 lb), last menstrual period 07/18/2012.   Current Medications: Current Facility-Administered Medications  Medication Dose Route Frequency Provider Last Rate Last Dose  . acetaminophen (TYLENOL) tablet 650 mg  650 mg Oral Q6H PRN Mickie D. Adams, PA      . alum & mag hydroxide-simeth (MAALOX/MYLANTA) 200-200-20 MG/5ML suspension 30 mL  30 mL Oral Q4H PRN Mickie D. Adams, PA      . calcium-vitamin D (OSCAL WITH D) 500-200 MG-UNIT per tablet 1 tablet  1 tablet Oral Daily Mickie D. Adams, PA   1 tablet at 08/22/12 0804  . hydrOXYzine (ATARAX/VISTARIL) tablet 25 mg  25 mg Oral TID PRN Mike Craze, MD   25 mg at 08/22/12  1258  . lisinopril (PRINIVIL,ZESTRIL) tablet 10 mg  10 mg Oral Daily Sanjuana Kava, NP   10 mg at 08/22/12 0806  . loratadine (CLARITIN) tablet 10 mg  10 mg Oral Daily Mike Craze, MD   10 mg at 08/22/12 0804  . magnesium hydroxide (MILK OF MAGNESIA) suspension 30 mL  30 mL Oral Daily PRN Mickie D. Adams, PA      . multivitamin with minerals tablet 1 tablet  1 tablet Oral Daily Mickie D. Adams, PA   1 tablet at 08/22/12 0804  . propranolol (INDERAL) tablet 10 mg  10 mg Oral QID Mike Craze, MD   10 mg at 08/22/12 1150  . QUEtiapine (SEROQUEL) tablet 12.5 mg  12.5 mg Oral TID Mike Craze, MD   12.5 mg at 08/22/12 1150  . QUEtiapine (SEROQUEL) tablet 400 mg  400 mg Oral QHS Mickie D. Adams, PA   400 mg at 08/21/12 2140    Lab Results: No results found for this or any previous visit (from the past 48 hour(s)).  Physical Findings: AIMS: Facial and Oral Movements Muscles of Facial Expression: None, normal Lips and Perioral Area: None, normal Jaw: None, normal Tongue: None, normal, Extremity Movements Upper (arms, wrists, hands, fingers): None, normal Lower (legs, knees, ankles, toes): None, normal,  Trunk Movements- none Neck, shoulders, hips: None, normal,  Overall Severity Severity of abnormal movements (highest score from questions above): None, normal Incapacitation due to abnormal movements: None, normal Patient's awareness of abnormal movements (rate only patient's report): na, dental Status Current problems with teeth and/or dentures?: No Does patient usually wear dentures?: No  CIWA:    COWS:     Treatment Plan Summary: Daily contact with patient to assess and evaluate symptoms and progress in treatment Group therapy  Medication management  Plan: Increase currently prescribed 25 mg vistaril tid to 50 mg vistaril tid. Continue all other orders as prescribed.  Norval Gable FNP-BC 08/22/2012, 3:36 PM

## 2012-08-22 NOTE — H&P (Signed)
Medical/psychiatric screening examination/treatment/procedure(s) were performed by non-physician practitioner. I have seen and examined this patient and agree with the major elements of this evaluation.  

## 2012-08-22 NOTE — Progress Notes (Signed)
Group Note  Date:  08/22/2012 Time:  1:15  Group Topic/Focus:  Overcoming obstacles to wellness  Additional Comments:  Did not attend group.  Roneisha Stern S 08/22/2012, 3:01 PM

## 2012-08-22 NOTE — Tx Team (Signed)
Interdisciplinary Treatment Plan Update (Adult)  Date:  08/22/2012  Time Reviewed:  10:43 AM   Progress in Treatment: Attending groups:   Yes   Participating in groups:  Yes Taking medication as prescribed:  Yes Tolerating medication:  Yes Family/Significant othe contact made: Counselor to follow up on collateral contact Patient understands diagnosis:  Yes Discussing patient identified problems/goals with staff: Yes Medical problems stabilized or resolved: Yes Denies suicidal/homicidal ideation:Yes Issues/concerns per patient self-inventory:  Other:  New problem(s) identified:  Reason for Continuation of Hospitalization: Anxiety Depression Medication stabilization  Interventions implemented related to continuation of hospitalization:  Medication Management; safety checks q 15 mins  Additional comments:  Estimated length of stay: 1-2 days  Discharge Plan: Home with outpatient follow up  New goal(s):  Review of initial/current patient goals per problem list:    1.  Goal(s): Eliminate SI/other thoughts of self harm   Met:  Yes  Target date: d/c  As evidenced by: Patient no longer endorsing SI/HI or other thoughts of self harm.    2.  Goal (s):  Reduce depression/anxiety   Met:  Yes  Target date: d/c  As evidenced by: Patient currently rating symptoms at four or below    3.  Goal(s): .stabilize on meds   Met:  Yes  Target date: d/c  As evidenced by: Patient reports being stabilized on medications - less symptomatic    4.  Goal(s):  Refer for outpatient follow up   Met:  Yes  Target date: d/c  As evidenced by: Follow up appointment scheduled    Attendees: Patient:     Family:     Physician:  Orson Aloe, MD 08/22/2012 10:43 AM   Nursing:    08/22/2012 10:43 AM   CaseManager:  Juline Patch, LCSW 08/22/2012 10:43 AM   Counselor:  Angus Palms, LCSW 08/22/2012 10:43 AM   Other:  Reyes Ivan, LCSWA

## 2012-08-22 NOTE — Progress Notes (Signed)
Patient seen during d/c planning group.  She reports being better and denies SI/HI.  She rates depression at three and anxiety at four.  Patient appears to be experiencing thought blocking.  MD advised.

## 2012-08-22 NOTE — Progress Notes (Signed)
[facility] Group Notes:  (Counselor/Nursing/MHT/Case Management/Adjunct)  08/22/2012 2:27 AM  Type of Therapy:  Psychoeducational Skills  Participation Level:  Minimal  Participation Quality:  Attentive  Affect:  Depressed  Cognitive:  Appropriate  Insight:  Good  Engagement in Group:  Limited  Engagement in Therapy:  Limited  Modes of Intervention:  Education  Summary of Progress/Problems: The patient stated that she had a good day since she was able to rest and because she was visited by her daughter.  She would not elaborate any further despite this author's encouragement. Her goal for tomorrow is to inquire about getting discharged.    Bellatrix Devonshire S 08/22/2012, 2:27 AM

## 2012-08-22 NOTE — Progress Notes (Signed)
Psychoeducational Group Note  Date:  08/22/2012 Time:  1100  Group Topic/Focus:  Self Care:   The focus of this group is to help patients understand the importance of self-care in order to improve or restore emotional, physical, spiritual, interpersonal, and financial health.  Participation Level:  Minimal  Participation Quality:  Appropriate  Affect:  Appropriate  Cognitive:  Appropriate  Insight:  Good  Engagement in Group:  Good  Additional Comments:  Pt participated in group.  Marquis Lunch, Nastassja Witkop 08/22/2012, 3:00 PM

## 2012-08-22 NOTE — Progress Notes (Signed)
Patient attended group this evening and came to medication window to receive her hs meds. Patient reports that her mother is leaving on tomorrow to attend her sisters funeral. Patient reports that mom will not return until Thursday. Patient want to be discharged before Thursday but her mother wants her to stay here until she returns. Patient encouraged to think about this and make sure she is ready to be discharged.  Patient currently denies pain, -si/hi/a/v hall. Patient offered support and encouragement, safety maintained on unit, will continue to monitor.

## 2012-08-22 NOTE — Progress Notes (Signed)
D:  [name] reports that her depression is improving and she denies SI/HI/AVH.  She is up and interacting with staff and other patients.  She is somewhat slow to respond to questions and needs extra time to think about her responses.   A:  Medications given as ordered.  Safety checks q 15 minutes. R:  Safety maintained on unit.

## 2012-08-23 MED ORDER — PROPRANOLOL HCL 10 MG PO TABS
10.0000 mg | ORAL_TABLET | Freq: Two times a day (BID) | ORAL | Status: DC
  Administered 2012-08-23 – 2012-08-25 (×4): 10 mg via ORAL
  Filled 2012-08-23 (×7): qty 1

## 2012-08-23 MED ORDER — LAMOTRIGINE 25 MG PO TABS
25.0000 mg | ORAL_TABLET | Freq: Every day | ORAL | Status: DC
  Administered 2012-08-23 – 2012-08-25 (×3): 25 mg via ORAL
  Filled 2012-08-23: qty 1
  Filled 2012-08-23: qty 14
  Filled 2012-08-23 (×6): qty 1

## 2012-08-23 MED ORDER — PROPRANOLOL HCL 20 MG PO TABS
20.0000 mg | ORAL_TABLET | Freq: Two times a day (BID) | ORAL | Status: DC
  Administered 2012-08-23 – 2012-08-25 (×4): 20 mg via ORAL
  Filled 2012-08-23: qty 2
  Filled 2012-08-23 (×2): qty 1
  Filled 2012-08-23: qty 2
  Filled 2012-08-23 (×6): qty 1

## 2012-08-23 NOTE — Progress Notes (Signed)
Patient ID: [name], female   DOB: 1961/05/30, 51 y.o.   MRN: [id number] D: Pt. Denies lethality and A/V/H's or other problems tonight.  A: Pt. is pleasant, smiles occasionally and mood and affect and congruent.Pt. Is attending groups and is appropriate with staff and peers. R: Will continue to monitor.

## 2012-08-23 NOTE — Progress Notes (Addendum)
[facility] MD Progress Note  08/23/2012 4:19 PM  S: "I want to know when I get to go home? ... My mother is going to ask me about the bipolar disorder medication that my brother is on, Lamictal.  I'll take it".  O: Pt reports on her patient survey in the AM depression at 8 and anxiety at 5.  She also expressed notion that she is on too much medication.  Lots of time was spent discussing examples of where medications are indicated and the doses being right for the person.  She still maintains that she is on too much medication.  Her compliance after D/C is questionable.  Her 4 overdoses were emphasized and she sees that being dead by accident or by intent would still be dead and not helpful to her 2 children or could get her to travel like she would like to.  Pt extremely resistant to taking medications.  She agrees to try Inderal 20 mg alternating with 10 mg to see if the 20 mg helps better.  She is still not on an overt antidepressant.  ROS: Neuro: no headaches, ataxia, weakness GI: no N/V/D/cramps/constipation MS: no weakness, muscle cramps, aches.  Plan: Try and get depression and anxiety under control AND convince pt that she is better on the medications.  Increase Inderal as trial for pt to see if it helps.  Start Lamictal as pt is responding to pressure from her mother to be on Lamictal.  Lamictal could help her a lot.  Diagnosis:  Axis I: Generalized Anxiety Disorder and Bipolar disorder, unspecified Axis II: Deferred Axis III: History reviewed. No pertinent past medical history. Axis IV: other psychosocial or environmental problems Axis V: 51-60 moderate symptoms  ADL's:  Intact  Sleep: Fair, spelt better the night before.  Appetite:  Good  Suicidal Ideation: "No" Plan:  No Intent:  No Means:  no Homicidal Ideation:  Plan:  No Intent:  No Means:  No  AEB (as evidenced by): per patient's reports.  Mental Status Examination/Evaluation: Objective:  Appearance: Casual  Eye  Contact::  Good  Speech:  Clear and Coherent  Volume:  Normal  Mood:  "pretty good"  Affect:  Appropriate  Thought Process:  Goal Directed, only about reducing her overall number or doses of medications while being conflicted about still being anxious and depressed. Cognitive flexibility is limited.  Decision making suffers considerably with this.  Orientation:  Full  Thought Content:  Rumination  Suicidal Thoughts:  No  Homicidal Thoughts:  No  Memory:  Immediate;   Good Recent;   Good Remote;   Good  Judgement:  Good  Insight:  Good  Psychomotor Activity:  Normal  Concentration:  Good  Recall:  Fair  Akathisia:  No  Handed:  Right  AIMS (if indicated):     Assets:  Desire for Improvement  Sleep:  Number of Hours: 1.5    Vital Signs:Blood pressure 105/73, pulse 91, temperature 97.6 F (36.4 C), temperature source Oral, resp. rate 16, height 5' (1.524 m), weight 56.246 kg (124 lb), last menstrual period 07/18/2012. Current Medications: Current Facility-Administered Medications  Medication Dose Route Frequency Provider Last Rate Last Dose  . acetaminophen (TYLENOL) tablet 650 mg  650 mg Oral Q6H PRN Mickie D. Adams, PA      . alum & mag hydroxide-simeth (MAALOX/MYLANTA) 200-200-20 MG/5ML suspension 30 mL  30 mL Oral Q4H PRN Mickie D. Adams, PA      . calcium-vitamin D (OSCAL WITH D) 500-200 MG-UNIT per  tablet 1 tablet  1 tablet Oral Daily Mickie D. Adams, PA   1 tablet at 08/23/12 0810  . hydrOXYzine (ATARAX/VISTARIL) tablet 50 mg  50 mg Oral TID Norval Gable, NP   50 mg at 08/23/12 1206  . lisinopril (PRINIVIL,ZESTRIL) tablet 10 mg  10 mg Oral Daily Sanjuana Kava, NP   10 mg at 08/23/12 0810  . loratadine (CLARITIN) tablet 10 mg  10 mg Oral Daily Mike Craze, MD   10 mg at 08/23/12 0810  . magnesium hydroxide (MILK OF MAGNESIA) suspension 30 mL  30 mL Oral Daily PRN Mickie D. Adams, PA      . multivitamin with minerals tablet 1 tablet  1 tablet Oral Daily Mickie D. Adams,  PA   1 tablet at 08/23/12 0809  . propranolol (INDERAL) tablet 10 mg  10 mg Oral BID Mike Craze, MD      . propranolol (INDERAL) tablet 20 mg  20 mg Oral BID Mike Craze, MD      . QUEtiapine (SEROQUEL) tablet 12.5 mg  12.5 mg Oral TID Mike Craze, MD   12.5 mg at 08/23/12 1206  . QUEtiapine (SEROQUEL) tablet 400 mg  400 mg Oral QHS Mickie D. Adams, PA   400 mg at 08/22/12 2149  . DISCONTD: propranolol (INDERAL) tablet 10 mg  10 mg Oral QID Mike Craze, MD   10 mg at 08/23/12 1206    Lab Results:  No results found for this or any previous visit (from the past 48 hour(s)).  Physical Findings: AIMS: Facial and Oral Movements Muscles of Facial Expression: None, normal Lips and Perioral Area: None, normal Jaw: None, normal Tongue: None, normal,Extremity Movements Upper (arms, wrists, hands, fingers): None, normal Lower (legs, knees, ankles, toes): None, normal, Trunk Movements Neck, shoulders, hips: None, normal, Overall Severity Severity of abnormal movements (highest score from questions above): None, normal Incapacitation due to abnormal movements: None, normal Patient's awareness of abnormal movements (rate only patient's report): No Awareness, Dental Status Current problems with teeth and/or dentures?: No Does patient usually wear dentures?: No  CIWA:    COWS:     Treatment Plan Summary: Daily contact with patient to assess and evaluate symptoms and progress in treatment Medication management Mood/anxiety less than 3/10 where the scale is 1 is the best and 10 is the worst  Pamlea Finder 08/23/2012, 4:19 PM

## 2012-08-23 NOTE — Progress Notes (Signed)
Patient has been up and active on the unit, attended group this evening and has voiced concern about starting Lamictal.Writer encouraged patient to speak with doctor concerning her thoughts about this medication. Patient reports she feels much better than this morning. Patient rates her depression at 0.  Patient currently denies having pain, -si/hi/a/v hall. Support and encouragement offered, safety maintained on unit, will continue to monitor.

## 2012-08-23 NOTE — Progress Notes (Signed)
[facility] Group Notes: (Counselor/Nursing/MHT/Case Management/Adjunct) 08/23/2012   @  1:15PM  Finding Balance in Life  Type of Therapy:  Group Therapy  Participation Level:  Minimal  Participation Quality: Attentive    Affect:  Blunted  Cognitive:  Appropriate  Insight:  None  Engagement in Group: Minimal  Engagement in Therapy:  None  Modes of Intervention:  Support and Exploration  Summary of Progress/Problems: [name]  was attentive but not engaged in group process. She did nod along with various statements made by other group members    [name] 08/23/2012   4:31 PM

## 2012-08-23 NOTE — Progress Notes (Signed)
D: Pt denies SI/HI/AV. Pt is pleasant and cooperative. Pt rates depression at a 8, anxiety at a 5, and Helplessness/hopelessness at a 5. Pt is doing some thought blocking. Pt is concerned about leaving because she is suppose to go to her mothers but her mom is currently out of town. Pt states at times she holds on to things and blames herself.  A: Pt was offered support and encouragement. Pt was given scheduled medications. Pt was encourage to attend groups. Q 15 minute checks were done for safety. RN spoke with pt and allowed pt to vent.   R:Pt attends groups and interacts well with peers and staff. Pt is taking medication. Pt has no complaints.Pt receptive to treatment and safety maintained on unit.

## 2012-08-23 NOTE — Care Management (Signed)
Patient attended discharge planning group this AM. She was quiet but did respond to questions that were directed specifically to her. She endorsed depression and anxiety rating her depression at eight, anxiety at five and hopelessness at five. She will live with Mom at discharge but mother is out of town until the end of the week. Patient exhibited some mild thought blocking. Joice Lofts RN MS EdS 08/23/2012  1:32 PM

## 2012-08-23 NOTE — Progress Notes (Signed)
Psychoeducational Group Note  Date:  08/23/2012 Time:  2000  Group Topic/Focus:  Wrap-Up Group:   The focus of this group is to help patients review their daily goal of treatment and discuss progress on daily workbooks.  Participation Level:  Active  Participation Quality:  Appropriate, Attentive, Resistant and Sharing  Affect:  Appropriate  Cognitive:  Appropriate  Insight:  Good  Engagement in Group:  Good  Additional Comments:  Patient shared that she attended all the groups and did not set a goal for today but that her goal for tomorrow would be to attend each of the groups and remained engaged in them.  Leaf [employer], Newton Pigg 08/23/2012, 9:34 PM

## 2012-08-24 MED ORDER — HYDROXYZINE HCL 50 MG PO TABS
75.0000 mg | ORAL_TABLET | Freq: Three times a day (TID) | ORAL | Status: DC
  Administered 2012-08-24 – 2012-08-25 (×4): 75 mg via ORAL
  Filled 2012-08-24: qty 3
  Filled 2012-08-24: qty 63
  Filled 2012-08-24 (×4): qty 3
  Filled 2012-08-24: qty 63
  Filled 2012-08-24 (×2): qty 3
  Filled 2012-08-24: qty 63
  Filled 2012-08-24: qty 3
  Filled 2012-08-24 (×2): qty 63
  Filled 2012-08-24 (×3): qty 3

## 2012-08-24 NOTE — Progress Notes (Addendum)
[facility] MD Progress Note  08/24/2012 5:08 PM  Diagnosis:  Axis I: Anxiety Disorder NOS Axis II: Deferred  " Im feeling better- calmer, less negative, less anxious. My anxiety level was at the point of having a nervous breakdown when I came. Now its down to a 4 to 5 out of 10" " I still have occasional thoughts of hurting myself when my anxiety goes up."  ADL's:  Intact " Sleep: Good " I slept well last night"  Appetite:  Good "My appetite is back to normal."  Suicidal Ideation:  Plan:  fleeting thoughts Intent:  no intent Means:  Being monitorred frequently, no means  Homicidal Ideation:  Plan:  denies Intent:  none Means:  none  AEB (as evidenced by):  Mental Status Examination/Evaluation: Objective:  Appearance: Fairly Groomed  Patent attorney::  Fair  Speech:  Slow  Volume:  Decreased  Mood:  Anxious and Depressed  Affect:  Blunt  Thought Process:  some poverty  Orientation:  Full  Thought Content:  WDL  Suicidal Thoughts:  Yes.  without intent/plan  Homicidal Thoughts:  No  Memory:  Immediate;   Fair Recent;   Fair Remote;   Fair  Judgement:  Impaired  Insight:  Lacking  Psychomotor Activity:  Normal  Concentration:  Fair  Recall:  Fair  Akathisia:  No  Handed:  Right  AIMS (if indicated):     Assets:  Desire for Improvement  Sleep:  Number of Hours: 5.75    Vital Signs:Blood pressure 123/84, pulse 77, temperature 97.9 F (36.6 C), temperature source Oral, resp. rate 16, height 5' (1.524 m), weight 56.246 kg (124 lb), last menstrual period 07/18/2012. Current Medications: Current Facility-Administered Medications  Medication Dose Route Frequency Provider Last Rate Last Dose  . acetaminophen (TYLENOL) tablet 650 mg  650 mg Oral Q6H PRN Mickie D. Adams, PA      . alum & mag hydroxide-simeth (MAALOX/MYLANTA) 200-200-20 MG/5ML suspension 30 mL  30 mL Oral Q4H PRN Mickie D. Adams, PA      . calcium-vitamin D (OSCAL WITH D) 500-200 MG-UNIT per tablet 1 tablet  1  tablet Oral Daily Mickie D. Adams, PA   1 tablet at 08/24/12 0805  . hydrOXYzine (ATARAX/VISTARIL) tablet 50 mg  50 mg Oral TID Norval Gable, NP   50 mg at 08/24/12 1658  . lamoTRIgine (LAMICTAL) tablet 25 mg  25 mg Oral Daily Mike Craze, MD   25 mg at 08/24/12 0805  . lisinopril (PRINIVIL,ZESTRIL) tablet 10 mg  10 mg Oral Daily Sanjuana Kava, NP   10 mg at 08/24/12 0806  . loratadine (CLARITIN) tablet 10 mg  10 mg Oral Daily Mike Craze, MD   10 mg at 08/24/12 0806  . magnesium hydroxide (MILK OF MAGNESIA) suspension 30 mL  30 mL Oral Daily PRN Mickie D. Adams, PA      . multivitamin with minerals tablet 1 tablet  1 tablet Oral Daily Mickie D. Adams, PA   1 tablet at 08/24/12 0805  . propranolol (INDERAL) tablet 10 mg  10 mg Oral BID Mike Craze, MD   10 mg at 08/24/12 1217  . propranolol (INDERAL) tablet 20 mg  20 mg Oral BID Mike Craze, MD   20 mg at 08/24/12 1658  . QUEtiapine (SEROQUEL) tablet 12.5 mg  12.5 mg Oral TID Mike Craze, MD   12.5 mg at 08/24/12 1658  . QUEtiapine (SEROQUEL) tablet 400 mg  400 mg Oral QHS Mickie D.  Adams, PA   400 mg at 08/23/12 2232    Lab Results: No results found for this or any previous visit (from the past 48 hour(s)).  Physical Findings: AIMS: Facial and Oral Movements Muscles of Facial Expression: None, normal Lips and Perioral Area: None, normal Jaw: None, normal Tongue: None, normal, Extremity Movements Upper (arms, wrists, hands, fingers): None, normal Lower (legs, knees, ankles, toes): None, normal,  Trunk Movements- none Neck, shoulders, hips: None, normal,  Overall Severity Severity of abnormal movements (highest score from questions above): None, normal Incapacitation due to abnormal movements: None, normal Patient's awareness of abnormal movements (rate only patient's report): NA  Dental Status Current problems with teeth and/or dentures?: No Does patient usually wear dentures?: No  CIWA:    COWS:     Treatment  Plan/ Summary: Daily contact with patient to assess and evaluate symptoms and progress in treatment Continue daily group therapy. Continue discharge planning. Increased Vistaril from 50 mg tid to 75 mg QID to assist with anxiety sxs.     Norval Gable FNP-BC 08/24/2012, 5:08 PM

## 2012-08-24 NOTE — Progress Notes (Signed)
D:  Patient reports that she slept well and that her appetite is improving.  She rates depression at 5/10 and hopelessness at 3/10.  She states that her anxiety is currently 5/10.  She denies SI/HI/AVH currently, but states that her suicidal ideation "comes and goes".  She up on unit attending groups, interacting with other patients and staff.   A:  Medications given as ordered, emotional support given.  Safety checks q 15 minutes. R:  Safety maintained unit.

## 2012-08-24 NOTE — Progress Notes (Signed)
Psychoeducational Group Note  Date:  08/24/2012 Time:  2000  Group Topic/Focus:  Wrap-Up Group:   The focus of this group is to help patients review their daily goal of treatment and discuss progress on daily workbooks.  Participation Level:  Active  Participation Quality:  Redirectable and Resistant  Affect:  Anxious  Cognitive:  Appropriate  Insight:  Limited  Engagement in Group:  Limited  Additional Comments:  Patient reported that she went to one or two groups today but that she did not set a goal for herself that she could remember.  Royalty Domagala, Newton Pigg 08/24/2012, 8:53 PM

## 2012-08-24 NOTE — Progress Notes (Signed)
[facility] Group Notes: (Counselor/Nursing/MHT/Case Management/Adjunct)  08/24/2012 @1 :15pm  Breathing & Meditation for Anxiety/Anger  Type of Therapy: Group Therapy   Participation Level: None   Participation Quality: Attentive   Affect: Blunted   Cognitive: Appropriate   Insight: None   Engagement in Group: None   Engagement in Therapy: None   Modes of Intervention: Support and Exploration   Summary of Progress/Problems: [name] was attentive but did not engage in group discussion or color breathing and guided imagery exercises.    Angus Palms, LCSW  08/24/2012 4:02 PM

## 2012-08-24 NOTE — Progress Notes (Signed)
Patient seen during discharge planning group.  She reports that she had a "rough morning" and is feeling agitated. Patient rates depression at six/seven, anxiety at six, hopelessness at four, and helplessness at two.  Patient denies SI/HI.

## 2012-08-24 NOTE — Progress Notes (Signed)
Psychoeducational Group Note  Date:  08/24/2012 Time:  1100  Group Topic/Focus:  Personal Choices and Values:   The focus of this group is to help patients assess and explore the importance of values in their lives, how their values affect their decisions, how they express their values and what opposes their expression.  Participation Level:  Active  Participation Quality:  Appropriate and Attentive  Affect:  Appropriate  Cognitive:  Appropriate  Insight:  Good  Engagement in Group:  Limited  Additional Comments:  Pt attended Personal choices and values group. Pt identified what were important values, and how negative values and choices could be diverted into a positive goal. Pt completed worksheet on identifying values and choices and choosing a value-oriented life.   Karleen Hampshire Brittini 08/24/2012, 2:08 PM

## 2012-08-25 MED ORDER — ADULT MULTIVITAMIN W/MINERALS CH: 1.0000 | ORAL_TABLET | Freq: Every day | ORAL | Status: AC

## 2012-08-25 MED ORDER — QUETIAPINE FUMARATE 400 MG PO TABS: 400.0000 mg | ORAL_TABLET | Freq: Every day | ORAL | Status: DC

## 2012-08-25 MED ORDER — LAMOTRIGINE 25 MG PO TABS: 25.0000 mg | ORAL_TABLET | Freq: Every day | ORAL | Status: DC

## 2012-08-25 MED ORDER — HYDROXYZINE PAMOATE 25 MG PO CAPS
75.0000 mg | ORAL_CAPSULE | Freq: Four times a day (QID) | ORAL | Status: DC
  Filled 2012-08-25 (×3): qty 100

## 2012-08-25 MED ORDER — LORATADINE 10 MG PO TABS: 10.0000 mg | ORAL_TABLET | Freq: Every day | ORAL | Status: DC

## 2012-08-25 MED ORDER — LISINOPRIL 10 MG PO TABS: 10.0000 mg | ORAL_TABLET | Freq: Every day | ORAL | Status: DC

## 2012-08-25 MED ORDER — CALCIUM CARBONATE-VITAMIN D 500-200 MG-UNIT PO TABS: 1.0000 | ORAL_TABLET | Freq: Every day | ORAL | Status: AC

## 2012-08-25 MED ORDER — HYDROXYZINE HCL 25 MG PO TABS: 75.0000 mg | ORAL_TABLET | Freq: Three times a day (TID) | ORAL | Status: DC

## 2012-08-25 MED ORDER — QUETIAPINE 12.5 MG HALF TABLET: 12.5000 mg | ORAL_TABLET | Freq: Three times a day (TID) | ORAL | Status: DC

## 2012-08-25 NOTE — [facility] Suicide Risk Assessment (Signed)
Suicide Risk Assessment  Discharge Assessment     Current Mental Status by Physician: Patient denies suicidal or homicidal ideation, hallucinations, illusions, or delusions. Patient engages with good eye contact, is able to focus adequately in a one to one setting, and has clear goal directed thoughts. Patient speaks with a natural conversational volume, rate, and tone. Anxiety was reported at 5 on a scale of 1 the least and 10 the most. Depression was reported at 2 on the same scale. Patient is oriented times 4, recent and remote memory intact. Judgement: limited by her mental illness and her opposition to taking medications Insight:  limited by her mental illness and her opposition to taking medications  Demographic factors:  Divorced or widowed;Caucasian Loss Factors:  NA Historical Factors:  Prior suicide attempts;Family history of suicide;Family history of mental illness or substance abuse Risk Reduction Factors:  Sense of responsibility to family;Positive social support;Positive therapeutic relationship  Continued Clinical Symptoms:  Severe Anxiety and/or Agitation Bipolar Disorder:   Bipolar II Previous Psychiatric Diagnoses and Treatments  Discharge Diagnoses: Axis I: Generalized Anxiety Disorder and Bipolar disorder, unspecified  Axis II: Deferred  Axis III: History reviewed. No pertinent past medical history.  Axis IV: other psychosocial or environmental problems  Axis V: 51-60 moderate symptoms  Cognitive Features That Contribute To Risk:  Closed-mindedness Thought constriction (tunnel vision)    Suicide Risk:  Minimal: No identifiable suicidal ideation.  Patients presenting with no risk factors but with morbid ruminations; may be classified as minimal risk based on the severity of the depressive symptoms  Labs:  No results found for this or any previous visit (from the past 72 hour(s)). RISK REDUCTION FACTORS: What pt has learned from hospital stay is that they  need to take medications and need to be supported by others in taking medications and other pursuits.  Risk of self harm is elevated by her bipolar disorder, her anxiety, her stuck thinking, and her opposition to taking medications.  Risk of harm to others is minimal in that she has not been involved in fights or had any legal charges filed on her.  Pt seen in treatment team where she divulged the above information. The treatment team concluded that she was ready for discharge and had met her goals for an inpatient setting.   PLAN: Discharge home Continue   Medication List     As of 08/25/2012  2:38 PM    TAKE these medications      Indication    calcium-vitamin D 500-200 MG-UNIT per tablet   Commonly known as: OSCAL WITH D   Take 1 tablet by mouth daily. For Vitamin D replacement and mood control.       hydrOXYzine 25 MG tablet   Commonly known as: ATARAX/VISTARIL   Take 3 tablets (75 mg total) by mouth 4 (four) times daily -  with meals and at bedtime. For anxiety       lamoTRIgine 25 MG tablet   Commonly known as: LAMICTAL   Take 1 tablet (25 mg total) by mouth daily. For mood control. Must start at low dose and work way up gradually.       lisinopril 10 MG tablet   Commonly known as: PRINIVIL,ZESTRIL   Take 1 tablet (10 mg total) by mouth daily. For control of high blood pressure       loratadine 10 MG tablet   Commonly known as: CLARITIN   Take 1 tablet (10 mg total) by mouth daily. For allergies.  multivitamin with minerals Tabs   Take 1 tablet by mouth daily. For nutritional supplementation.       QUEtiapine 400 MG tablet   Commonly known as: SEROQUEL   Take 1 tablet (400 mg total) by mouth at bedtime. For mood control and insomnia       QUEtiapine 12.5 mg Tabs   Commonly known as: SEROQUEL   Take 0.5 tablets (12.5 mg total) by mouth 3 (three) times daily. For anxiety        Follow-up recommendations:  Activities: Resume typical activities Diet: Resume  typical diet Tests: none Other: Follow up with outpatient provider and report any side effects to out patient prescriber.  Dan Humphreys, Oliverio Cho 08/25/2012 2:38 PM

## 2012-08-25 NOTE — Progress Notes (Signed)
[facility] Group Notes:  (Counselor/Nursing/MHT/Case Management/Adjunct) 08/25/2012  1:15pm-2:30pm [facility]   Type of Therapy:  Group Therapy  Participation Level:  Did Not Attend     Angus Palms, LCSW 08/25/2012  4:28 PM

## 2012-08-25 NOTE — Progress Notes (Signed)
I agree with this note this date. 

## 2012-08-25 NOTE — Tx Team (Signed)
Interdisciplinary Treatment Plan Update (Adult)  Date:  08/25/2012  Time Reviewed:  10:32 AM   Progress in Treatment: Attending groups:   Yes   Participating in groups:  Yes Taking medication as prescribed:  Yes Tolerating medication:  Yes Family/Significant othe contact made:  Contact made with family Patient understands diagnosis:  Yes Discussing patient identified problems/goals with staff: Yes Medical problems stabilized or resolved: Yes Denies suicidal/homicidal ideation:Yes Issues/concerns per patient self-inventory:  Other:  New problem(s) identified:  Reason for Continuation of Hospitalization:  Interventions implemented related to continuation of hospitalization:  Additional comments:  Estimated length of stay:  Discharge today  Discharge Plan:  Home with outpatient follow up at [facility]):  Review of initial/current patient goals per problem list:    1.  Goal(s):  Eliminate SI/other thoughts of self harm   Met:  Yes  Target date: d/c  As evidenced by: Patient will no longer endorse SI/other thought self harm    2.  Goal (s): Reduce depression/anxiety (both rated at three today)   Met:  Yes  Target date: d/c  As evidenced by: Patient currently rating symptoms at four or below    3.  Goal(s):  .stabilize on meds   Met:  Yes  Target date: d/c  As evidenced by:  4.  Goal(s):  Refer for outpatient follow up   Met:  Yes  Target date: d/c  As evidenced by: Follow up scheduled  Attendees: Patient:  [name] 08/25/2012 10:32 AM  Nursing:  Nestor Ramp, RN 10/3/201310:32 AM  Physician:  Orson Aloe, MD 08/25/2012 10:32 AM   Nursing:   Berneice Heinrich, RN 08/25/2012 10:32 AM   CaseManager:  Juline Patch, LCSW 08/25/2012 10:32 AM   Counselor:  Angus Palms, LCSW 08/25/2012 10:32 AM   Other:  Foye Clock, MSW Intern    08/25/2012 10:33 AM Other:  Gayleen Orem, RN      08/25/2012 10:34 AM

## 2012-08-25 NOTE — Progress Notes (Signed)
[facility] Case Management Discharge Plan:  Will you be returning to the same living situation after discharge: Yes,  Patient to return home with sister At discharge, do you have transportation home?:Yes,  Patient to contact sister to transport her home Do you have the ability to pay for your medications:No.  Patient to be assisted with indigent medications  Interagency Information:     Release of information consent forms completed and in the chart;  Patient's signature needed at discharge.  Patient to Follow up at:  Follow-up Information    Follow up with [facility]. On 08/26/2012. (You are scheduled with Andres Ege at [facility] on Friday, August 26, 2012 at 12:45 PM)    Contact information:   [address] [place], [place]  [address]  [phone]         Patient denies SI/HI:   Yes,  Patient no longer endorsing SI/HI or other thoughts of self harm    Safety Planning and Suicide Prevention discussed:  Yes,  Reviewed during aftercare groups  Barrier to discharge identified:Yes,  Limited support  Summary and Recommendations: Patient encouraged to be compliant with medications and follow up with outpatient recommendations    Wynn Banker 08/25/2012, 10:40 AM

## 2012-08-25 NOTE — Progress Notes (Signed)
Pt discharged per MD orders; pt currently denies SI/HI and auditory/visual hallucinations; pt was given education by RN regarding follow-up appointments and medications and pt denied any questions or concerns about these instructions; pt was then escorted to search room to retrieve her belongings by RN before being discharged to hospital lobby. 

## 2012-08-25 NOTE — Progress Notes (Signed)
D: Pt in bed resting with eyes closed. Respirations even and unlabored. Pt appears to be in no signs of distress at this time. A: Q15min checks remains for this pt. R: Pt remains safe at this time.   

## 2012-08-25 NOTE — Progress Notes (Signed)
Writer advised by patient that daughter has concerns about patient discharging.  Writer spoke with daughter, [name], who stated she does not believe patient ready to discharge from the hospital.  She shared she spoke with patient yesterday and she did not sound good.  She also stated that mother had reported being on her knees yesterday like a moving about like a "dog" and the RN was aware.  Daughter advised that Clinical research associate had not heard any report of the event.  Writer checked notes and did not see any documentation of event.  Writer also checked with MD and he stated that he has not received a report of that happening.  Daughter was informed that patient no longer endorsing SI/HI and rating symptoms at three.  She was informed that we are a crisis intervention hospital and patient's crisis been stabilized.  She was also informed patient has a follow up appointment with outpatient provider tomorrow.  Daughter stated she will transport patient home around 19:00 this evening.  Per conversation, a taxi voucher provider in the event patient needs assistance.

## 2012-08-26 ENCOUNTER — Emergency Department (HOSPITAL_COMMUNITY)
Admission: EM | Admit: 2012-08-26 | Discharge: 2012-08-27 | Disposition: A | Discharge status: Home / Self Care | Payer: Self-pay | Attending: Emergency Medicine | Admitting: Emergency Medicine

## 2012-08-26 ENCOUNTER — Encounter (HOSPITAL_COMMUNITY): Payer: Self-pay

## 2012-08-26 DIAGNOSIS — IMO0002 Reserved for concepts with insufficient information to code with codable children: Secondary | ICD-10-CM | POA: Insufficient documentation

## 2012-08-26 DIAGNOSIS — R451 Restlessness and agitation: Secondary | ICD-10-CM

## 2012-08-26 DIAGNOSIS — F419 Anxiety disorder, unspecified: Secondary | ICD-10-CM

## 2012-08-26 DIAGNOSIS — F22 Delusional disorders: Secondary | ICD-10-CM | POA: Insufficient documentation

## 2012-08-26 DIAGNOSIS — F411 Generalized anxiety disorder: Secondary | ICD-10-CM | POA: Insufficient documentation

## 2012-08-26 DIAGNOSIS — Z79899 Other long term (current) drug therapy: Secondary | ICD-10-CM | POA: Insufficient documentation

## 2012-08-26 DIAGNOSIS — F341 Dysthymic disorder: Secondary | ICD-10-CM | POA: Insufficient documentation

## 2012-08-26 DIAGNOSIS — I10 Essential (primary) hypertension: Secondary | ICD-10-CM | POA: Insufficient documentation

## 2012-08-26 HISTORY — DX: Anxiety disorder, unspecified: F41.9

## 2012-08-26 HISTORY — DX: Essential (primary) hypertension: I10

## 2012-08-26 HISTORY — DX: Major depressive disorder, single episode, unspecified: F32.9

## 2012-08-26 HISTORY — DX: Bipolar disorder, unspecified: F31.9

## 2012-08-26 LAB — RAPID URINE DRUG SCREEN, HOSP PERFORMED
Amphetamines: NOT DETECTED
Barbiturates: NOT DETECTED
Benzodiazepines: NOT DETECTED
Cocaine: NOT DETECTED
Opiates: NOT DETECTED
Tetrahydrocannabinol: NOT DETECTED

## 2012-08-26 LAB — ETHANOL: Alcohol, Ethyl (B): <11 mg/dL (ref 0–11)

## 2012-08-26 LAB — BASIC METABOLIC PANEL
BUN: 12 mg/dL (ref 6–23)
CO2: 31 mEq/L (ref 19–32)
Calcium: 9.4 mg/dL (ref 8.4–10.5)
Chloride: 98 mEq/L (ref 96–112)
Creatinine, Ser: 0.71 mg/dL (ref 0.50–1.10)
GFR calc Af Amer: >90 mL/min (ref >90)
GFR calc non Af Amer: >90 mL/min (ref >90)
Glucose, Bld: 71 mg/dL (ref 70–99)
Potassium: 4.3 mEq/L (ref 3.5–5.1)
Sodium: 137 mEq/L (ref 135–145)

## 2012-08-26 LAB — CBC
HCT: 41.2 % (ref 36.0–46.0)
Hemoglobin: 14.4 g/dL (ref 12.0–15.0)
MCH: 30.8 pg (ref 26.0–34.0)
MCHC: 35 g/dL (ref 30.0–36.0)
MCV: 88 fL (ref 78.0–100.0)
Platelets: 291 10*3/uL (ref 150–400)
RBC: 4.68 MIL/uL (ref 3.87–5.11)
RDW: 12.8 % (ref 11.5–15.5)
WBC: 8.4 10*3/uL (ref 4.0–10.5)

## 2012-08-26 LAB — ACETAMINOPHEN LEVEL: Acetaminophen (Tylenol), Serum: <15 ug/mL (ref 10–30)

## 2012-08-26 LAB — SALICYLATE LEVEL: Salicylate Lvl: <2 mg/dL — ABNORMAL LOW (ref 2.8–20.0)

## 2012-08-26 MED ORDER — LORAZEPAM 1 MG PO TABS
1.0000 mg | ORAL_TABLET | Freq: Three times a day (TID) | ORAL | Status: DC | PRN
  Administered 2012-08-26 – 2012-08-27 (×2): 1 mg via ORAL
  Filled 2012-08-26 (×2): qty 1

## 2012-08-26 MED ORDER — ACETAMINOPHEN 325 MG PO TABS: 650.0000 mg | ORAL_TABLET | ORAL | Status: DC | PRN

## 2012-08-26 MED ORDER — RISPERIDONE 2 MG PO TABS
2.0000 mg | ORAL_TABLET | Freq: Every day | ORAL | Status: DC
  Administered 2012-08-26 – 2012-08-27 (×2): 2 mg via ORAL
  Filled 2012-08-26 (×2): qty 1

## 2012-08-26 MED ORDER — IBUPROFEN 600 MG PO TABS: 600.0000 mg | ORAL_TABLET | Freq: Three times a day (TID) | ORAL | Status: DC | PRN

## 2012-08-26 NOTE — ED Notes (Signed)
MD at bedside. 

## 2012-08-26 NOTE — ED Notes (Signed)
Pt was admitted to [facility] for intentional overdose, stayed there for 1 week, discharge yesterday. Daughter brought pt back home, around 2 AM last night pt started screaming for her name, feeling really anxious. Daughter doesn't think pt is ready to be discharged yet. Pt do appear anxious, verbally sts she is scare. Denied SI/HI, denied hallucination. Cooperative

## 2012-08-26 NOTE — ED Notes (Signed)
Pt lives with mother, Estella Husk. Her contact info is [phone].

## 2012-08-26 NOTE — ED Notes (Signed)
[id number]<GN> Expected date:<BR> Expected time:<BR> Means of arrival:<BR> Comments:<BR> There is already a patient in this room

## 2012-08-26 NOTE — ED Notes (Signed)
During hourly rounding, daughter now sts pt took unknown amount of seroquel XR prior to coming here today.  Prescription bottle only had 3 tablets left with 400mg  each

## 2012-08-26 NOTE — ED Notes (Signed)
Pt put on cardiac monitor, sitter at bedside.

## 2012-08-26 NOTE — ED Provider Notes (Signed)
History     CSN: [id number]  Arrival date & time 08/26/12  1315   First MD Initiated Contact with Patient 08/26/12 1507      Chief Complaint  Patient presents with  . Drug Overdose  . Anxiety    (Consider location/radiation/quality/duration/timing/severity/associated sxs/prior treatment) The history is provided by the patient. The history is limited by the condition of the patient.  [name] is a 51 y.o. female here with anxiety and possible seroquel overdose. She was discharged from psych hospital yesterday (she had aspirin overdose last time). She went home and felt anxious overnight. She tried to take her seroquel today. She said that she put them in her mouth and her daughter made her spit them out. She denies ingesting the medicines or taking any other medicines. No aspirin or tylenol use or alcohol use today. Denies active suicidal ideations, just feels anxious.   As per daughter, she has been anxious since yesterday. Her daughter was trying to bring her to a counselor today when she got anxious and tried to take seroquel around 1pm. The daughter swept the pills out of her mouth. She also had some delusions since yesterday. She is also talking about "going to heaven" but denies active suicidal ideations.   Past Medical History  Diagnosis Date  . Depression   . Hypertension   . Anxiety   . Bipolar 1 disorder     Past Surgical History  Procedure Date  . Wisdom tooth extraction     No family history on file.  History  Substance Use Topics  . Smoking status: Never Smoker   . Smokeless tobacco: Never Used  . Alcohol Use: No    OB History    Grav Para Term Preterm Abortions TAB SAB Ect Mult Living                  Review of Systems  Psychiatric/Behavioral: The patient is nervous/anxious.        Overdose   All other systems reviewed and are negative.    Allergies  Sulfa antibiotics  Home Medications   Current Outpatient Rx  Name Route Sig Dispense  Refill  . CALCIUM CARBONATE-VITAMIN D 500-200 MG-UNIT PO TABS Oral Take 1 tablet by mouth daily. For Vitamin D replacement and mood control. 30 tablet 0  . HYDROXYZINE HCL 25 MG PO TABS Oral Take 37.5 mg by mouth 3 (three) times daily as needed. For anxiety.    Marland Kitchen LAMOTRIGINE 25 MG PO TABS Oral Take 1 tablet (25 mg total) by mouth daily. For mood control. Must start at low dose and work way up gradually. 30 tablet 0  . LISINOPRIL 10 MG PO TABS Oral Take 1 tablet (10 mg total) by mouth daily. For control of high blood pressure 30 tablet 0  . LORATADINE 10 MG PO TABS Oral Take 10 mg by mouth daily as needed. For allergies.    . ADULT MULTIVITAMIN W/MINERALS CH Oral Take 1 tablet by mouth daily. For nutritional supplementation. 30 tablet 0  . QUETIAPINE 12.5 MG HALF TABLET Oral Take 0.5 tablets (12.5 mg total) by mouth 3 (three) times daily. For anxiety 45 tablet 0  . QUETIAPINE FUMARATE 400 MG PO TABS Oral Take 1 tablet (400 mg total) by mouth at bedtime. For mood control and insomnia 30 tablet 0    BP 133/88  Pulse 102  Temp 98 F (36.7 C) (Oral)  Resp 16  SpO2 99%  LMP 07/18/2012  Physical Exam  Nursing  note and vitals reviewed. Constitutional: She is oriented to person, place, and time. She appears well-developed.       Anxious   HENT:  Head: Normocephalic.  Mouth/Throat: Oropharynx is clear and moist.  Eyes: Conjunctivae normal are normal. Pupils are equal, round, and reactive to light.  Neck: Normal range of motion. Neck supple.  Cardiovascular: Normal rate, regular rhythm and normal heart sounds.   Pulmonary/Chest: Effort normal and breath sounds normal.  Abdominal: Soft. Bowel sounds are normal.  Musculoskeletal: Normal range of motion.  Neurological: She is alert and oriented to person, place, and time.  Skin: Skin is warm and dry.  Psychiatric:       Depressed and anxious mood, poor judgement.     ED Course  Procedures (including critical care time)  Labs Reviewed    SALICYLATE LEVEL - Abnormal; Notable for the following:    Salicylate Lvl <2.0 (*)     All other components within normal limits  CBC  BASIC METABOLIC PANEL  URINE RAPID DRUG SCREEN (HOSP PERFORMED)  ACETAMINOPHEN LEVEL  ETHANOL   No results found.   1. Anxiety   2. Agitation   3. Delusion      Date: 08/26/2012  Rate: 111  Rhythm: sinus tachycardia  QRS Axis: normal  Intervals: normal  ST/T Wave abnormalities: nonspecific ST changes  Conduction Disutrbances:none  Narrative Interpretation:   Old EKG Reviewed: none available    MDM  [name] is a 51 y.o. female here with anxiety and attempting to take more seroquel. Will get labs and tox level. Will likely need psych admission again.   5:44 PM Patient medically cleared. Tox level neg. ACT team will evaluate. Will transfer to psych ED.        Richardean Canal, MD 08/26/12 1745

## 2012-08-26 NOTE — ED Notes (Signed)
Pt. C/o increased anxiety, medication given.

## 2012-08-26 NOTE — ED Notes (Signed)
Pt. Denies S/I, admits to putting 3/4 pills in her mouth today and daughter took them out, and banging her head against the sink while at [facility] in pt.'s bathroom, RN stopped pt.  Acute RN reported that pt. Did not hurt herself.  Pt. Denies H/I, A/V/H.  Pt states that she was D/C'ed yesterday from [facility], went home and felt "overwhelmed".  Daughter took her to [facility] and they asked her to come to [facility] ED.  Contracted with pt. For her safety by asking her not to hurt herself and talk with her nurse if she starts to feel this way, pt. Verbalized a weak "ok".  Staff will continue to monitor pt. While in bathroom for her safety.

## 2012-08-26 NOTE — ED Notes (Signed)
Pt is now SI.

## 2012-08-26 NOTE — ED Notes (Signed)
When pt was taken to the restroom for urine specimen, upon entering room, she stated, "I can't stand this," and began to hit her forehead on the sink. Pt was immediately stopped. She kept repeating that she was, "so sorry," and "embarrassed." She did not have any visible redness or injury to forehead. When asked why she did it, she did not have any answer. Pt assisted back to room with family and sitter at bedside.

## 2012-08-26 NOTE — ED Notes (Signed)
Pt is in triage room 2, unable to move pt in the chart stating pt has a bed assigned within [facility].

## 2012-08-26 NOTE — Progress Notes (Signed)
After Visit Summary (AVS) Faxed to: 08/26/2012 Discharge Summary  Faxed to: 08/26/2012 Suicide Risk Assessment-Discharge Assessment Faxed to: 08/26/2012  Faxed/Sent to the Next Level Care Provider- [facility] @ [phone]  Madhavi Hamblen Danielle, 08/26/2012, 02:00pm  

## 2012-08-27 NOTE — ED Notes (Signed)
EDP is still going to come and talk to mom about discharging pt. Writer talked to mom and basically, mom just lost her sister and just returned from burying her and she says that pt has a pattern in which she follows and has overdosed 5 times this year and now is banging her head on the floor which is new. Mom feels she can't take care of pt and keep her safe if she takes her home today. Pt keeps saying that she will be fine.

## 2012-08-27 NOTE — BHH Counselor (Signed)
Discussed clinicals with Dr. Effie Shy and he sts that he will discharge patient. He met with patient this am and feels that patient is not a danger to self. Patient discharged and given referrals to follow up with community mental health providers. Patient sts that she see's a psychiatrist at [facility] for medication mgt. Writer encouraged patient to follow up. Patient agreed to follow up accordingly.   Patient's mother arrived to the ED to pick patient up. Patient's mother was upset and wanted to discuss why patient was being discharged. Writer obtained permission form patient to discuss her disposition with her mother. Writer explained to patient's mother the reasoning why patient wasn't being made to stay for treatment. The reason was b/c patient denied SI and was able to contract for safety with EDP. Writer also asked patient with mother present if she felt suicidal or had any suicidal thoughts. Patient responded "NO. Patient also asked if she was able to contract for safety and pt responded "YES". Patient asked twice in the presence of her mother. Also notified Dr. Effie Shy of patient's mothers concerns and he agreed to come discuss with patients mother the reason for his disposition (discharge home).  Dr. Effie Shy decided to still discharge patient home after speaking with her mother.

## 2012-08-27 NOTE — ED Notes (Signed)
Pt  Reports her daughter took her to her follow up doctor's appointment yesterday after being discharged from [facility] and when they got there she took 4-5 Seroquel and "popped" them in her mouth but her daughter got them out. Pt denies trying to kill herself she said she thought the pills would take the edge off a little bit. She said she knew it wasn't enough to hurt her. Pt said she's been banging her head on the sink due to racing thoughts and trying to slow them down. Asked pt if it was working for her and she said no. Asked pt if she was done doing that to herself and she agreed she will not do that today.

## 2012-08-27 NOTE — ED Notes (Signed)
Went over discharge paperwork with pt, she indicated full understanding. She did mention her mom is going to be upset as she doesn't think she should be going home yet. Pt came up to nursing station to call mom again but didn't get an answer.

## 2012-08-27 NOTE — ED Notes (Signed)
ACT rep talked to pt's mom about pt discharging because mom feels pt needs inpatient treatment again. Pt consistently tells ACT rep that she's safe to go home. EDP to talk to mom as well.

## 2012-08-27 NOTE — BH Assessment (Signed)
Assessment Note   [name] is a 51 y.o. female who was brought in to [name] voluntarily after pt attempted overdose by putting medication in her mouth. Pt's daughter found pt with medication in her mouth and took it of her mouth. Pt was taken to [name] and then sent here for an evaluation. Pt was [facility] from [name] after 1 week of inpatient treatment. Pt was intally admitted to [name] after attempting to overdose on Benadryl and Asprin.   Pt appears to minimizing events and has a blunted affect. She reports that she was anxious and agitated which caused her to put medication her mouth. She states she wanted to rest. She was unable to identify a precipitating event. She reports "a couple" of prior attempts and states "ya I have tried but not bad, just took some pills."   She denies HI, [name], and SA. Pt currently lives with her mother and receives support from her daughter. She was very vague and guarded with information. Pt appears to be a danger to herself at this time.    Axis I: Anxiety Disorder NOS and Major Depression, Recurrent severe Axis II: Deferred Axis III:  Past Medical History  Diagnosis Date  . Depression   . Hypertension   . Anxiety   . Bipolar 1 disorder    Axis IV: other psychosocial or environmental problems Axis V: 11-20 some danger of hurting self or others possible OR occasionally fails to maintain minimal personal hygiene OR gross impairment in communication  Past Medical History:  Past Medical History  Diagnosis Date  . Depression   . Hypertension   . Anxiety   . Bipolar 1 disorder     Past Surgical History  Procedure Date  . Wisdom tooth extraction     Family History: No family history on file.  Social History:  reports that she has never smoked. She has never used smokeless tobacco. She reports that she does not drink alcohol or use illicit drugs.  Additional Social History:  Alcohol / Drug Use History of alcohol / drug use?: No history of  alcohol / drug abuse  CIWA: CIWA-Ar BP: 121/74 mmHg Pulse Rate: 118  COWS:    Allergies:  Allergies  Allergen Reactions  . Sulfa Antibiotics     Home Medications:  (Not in a [facility] admission)  OB/GYN Status:  Patient's last menstrual period was 07/18/2012.  General Assessment Data Location of Assessment: [facility] Living Arrangements: Parent Can pt return to current living arrangement?: Yes Admission Status: Voluntary Is patient capable of signing voluntary admission?: Yes Transfer from: Acute [facility] Referral Source: MD  Education Status Is patient currently in school?: No Highest grade of school patient has completed: high school graduate  Risk to self Suicidal Ideation: Yes-Currently Present Suicidal Intent: Yes-Currently Present Is patient at risk for suicide?: Yes Suicidal Plan?: Yes-Currently Present Specify Current Suicidal Plan: overdose Access to Means: Yes Specify Access to Suicidal Means: medication What has been your use of drugs/alcohol within the last 12 months?: denies Previous Attempts/Gestures: Yes How many times?: 2  (she states a couple times in the past) Other Self Harm Risks: none Triggers for Past Attempts: Other personal contacts Intentional Self Injurious Behavior: None Family Suicide History: Yes (uncle) Recent stressful life event(s):  (states none) Persecutory voices/beliefs?: No Depression: Yes Depression Symptoms: Despondent;Insomnia Substance abuse history and/or treatment for substance abuse?: No Suicide prevention information given to non-admitted patients: Not applicable  Risk to Others Homicidal Ideation: No Thoughts of Harm to  Others: No Current Homicidal Intent: No Current Homicidal Plan: No Access to Homicidal Means: No Identified Victim: none History of harm to others?: No Assessment of Violence: None Noted Violent Behavior Description: coopeartive Does patient have access to weapons?: No Criminal Charges Pending?:  No Does patient have a court date: No  Psychosis Hallucinations: None noted Delusions: None noted  Mental Status Report Appear/Hygiene: Disheveled Eye Contact: Fair Motor Activity: Unremarkable Speech: Logical/coherent Level of Consciousness: Alert Mood: Depressed Affect: Blunted Anxiety Level: Moderate Thought Processes: Coherent;Relevant Judgement: Unimpaired Orientation: Person;Place;Situation;Time Obsessive Compulsive Thoughts/Behaviors: None  Cognitive Functioning Concentration: Normal Memory: Recent Intact;Remote Intact IQ: Average Insight: Poor Impulse Control: Poor Appetite: Fair Weight Loss: 0  Weight Gain: 0  Sleep: Decreased Vegetative Symptoms: None  ADLScreening [facility]) Patient's cognitive ability adequate to safely complete daily activities?: Yes Patient able to express need for assistance with ADLs?: Yes Independently performs ADLs?: Yes (appropriate for developmental age)  Abuse/Neglect [facility]) Physical Abuse: Denies Verbal Abuse: Yes, past (Comment) Sexual Abuse: Denies  Prior Inpatient Therapy Prior Inpatient Therapy: Yes Prior Therapy Dates: 2013 Prior Therapy Facilty/Provider(s): [facility] Reason for Treatment: SI  Prior Outpatient Therapy Prior Outpatient Therapy: Yes Prior Therapy Dates: ongoing Prior Therapy Facilty/Provider(s): [name] Reason for Treatment: medication management and depression  ADL Screening (condition at time of admission) Patient's cognitive ability adequate to safely complete daily activities?: Yes Patient able to express need for assistance with ADLs?: Yes Independently performs ADLs?: Yes (appropriate for developmental age) Weakness of Legs: None Weakness of Arms/Hands: None  Home Assistive Devices/Equipment Home Assistive Devices/Equipment: None    Abuse/Neglect Assessment (Assessment to be complete while patient is alone) Physical Abuse: Denies Verbal Abuse: Yes, past (Comment) Sexual Abuse:  Denies Exploitation of patient/patient's resources: Denies Self-Neglect: Denies     Merchant navy officer (For Healthcare) Advance Directive: Patient does not have advance directive;Patient would not like information Pre-existing out of facility DNR order (yellow form or pink MOST form): No Nutrition Screen- [facility] Patient's home diet: Regular Have you recently lost weight without trying?: No Have you been eating poorly because of a decreased appetite?: No Malnutrition Screening Tool Score: 0   Additional Information 1:1 In Past 12 Months?: No CIRT Risk: No Elopement Risk: No Does patient have medical clearance?: Yes     Disposition:  Disposition Disposition of Patient: Referred to;Inpatient treatment program Type of inpatient treatment program: Adult Patient referred to:  [facility])  On Site Evaluation by:   Reviewed with Physician:     Georgina Quint A 08/27/2012 4:24 AM

## 2012-08-27 NOTE — ED Notes (Signed)
Pt came up to nurses station for drinks for her and her mom.

## 2012-08-27 NOTE — ED Provider Notes (Signed)
Patient states she is no longer suicidal. She states that yesterday, she was anxious. She did not attempt suicide. She is calm, cooperative, and lucid, now. She understands that she is to followup with her usual treatment team. No change in medications at this time are necessary.  Flint Melter, MD 08/27/12 1001

## 2012-08-27 NOTE — ED Notes (Signed)
EDP is in talking to mom and pt now.

## 2012-08-27 NOTE — ED Notes (Signed)
Pt laying on her bed reading a magazine. Denies needs at this time.

## 2012-08-27 NOTE — ED Notes (Signed)
Pt up to bathroom. Mom still in room.

## 2012-08-27 NOTE — ED Notes (Signed)
Pt asked if her medications that she's going home on are changing, explained that we won't know until we see her discharge papers.

## 2012-08-27 NOTE — ED Notes (Signed)
Pt walked out to discharge window by Clinical research associate. No belongings here at the hospital as daughter took all belongings home with her.

## 2012-08-27 NOTE — ED Notes (Addendum)
Pt is sitting up in her bed. Per ACT team representative EDP is discharging pt home. Pt came up to nursing station to use the phone

## 2012-08-30 ENCOUNTER — Inpatient Hospital Stay: Payer: Self-pay | Admitting: Psychiatry

## 2012-08-30 LAB — CBC
HCT: 41.4 % (ref 35.0–47.0)
HGB: 14.2 g/dL (ref 12.0–16.0)
MCH: 30.9 pg (ref 26.0–34.0)
MCHC: 34.4 g/dL (ref 32.0–36.0)
MCV: 90 fL (ref 80–100)
Platelet: 298 10*3/uL (ref 150–440)
RBC: 4.61 10*6/uL (ref 3.80–5.20)
RDW: 13.3 % (ref 11.5–14.5)
WBC: 13.5 10*3/uL — ABNORMAL HIGH (ref 3.6–11.0)

## 2012-08-30 LAB — DRUG SCREEN, URINE

## 2012-08-30 LAB — BASIC METABOLIC PANEL
Anion Gap: 7 (ref 7–16)
BUN: 13 mg/dL (ref 7–18)
Calcium, Total: 8.6 mg/dL (ref 8.5–10.1)
Chloride: 102 mmol/L (ref 98–107)
Co2: 32 mmol/L (ref 21–32)
Creatinine: 0.85 mg/dL (ref 0.60–1.30)
EGFR (African American): >60
EGFR (Non-African Amer.): >60
Glucose: 95 mg/dL (ref 65–99)
Osmolality: 281 (ref 275–301)
Potassium: 3.6 mmol/L (ref 3.5–5.1)
Sodium: 141 mmol/L (ref 136–145)

## 2012-08-30 LAB — URINALYSIS, COMPLETE
Bacteria: NONE SEEN
Bilirubin,UR: NEGATIVE
Glucose,UR: NEGATIVE mg/dL (ref 0–75)
Ketone: NEGATIVE
Nitrite: NEGATIVE
Ph: 7 (ref 4.5–8.0)
Protein: NEGATIVE
RBC,UR: 8 /HPF (ref 0–5)
Specific Gravity: 1.011 (ref 1.003–1.030)
Squamous Epithelial: 32
Transitional Epi: 5
WBC UR: 31 /HPF (ref 0–5)

## 2012-08-30 LAB — ACETAMINOPHEN LEVEL: Acetaminophen: <2 ug/mL

## 2012-08-30 LAB — PREGNANCY, URINE: Pregnancy Test, Urine: NEGATIVE m[IU]/mL

## 2012-08-30 LAB — SALICYLATE LEVEL: Salicylates, Serum: <1.7 mg/dL

## 2012-09-01 LAB — URINALYSIS, COMPLETE
Bacteria: NONE SEEN
Bilirubin,UR: NEGATIVE
Blood: NEGATIVE
Glucose,UR: NEGATIVE mg/dL (ref 0–75)
Ketone: NEGATIVE
Nitrite: NEGATIVE
Ph: 6 (ref 4.5–8.0)
Protein: NEGATIVE
RBC,UR: 7 /HPF (ref 0–5)
Specific Gravity: 1.015 (ref 1.003–1.030)
Squamous Epithelial: 13
WBC UR: 58 /HPF (ref 0–5)

## 2012-09-02 LAB — LIPID PANEL
Cholesterol: 144 mg/dL (ref 0–200)
HDL Cholesterol: 63 mg/dL — ABNORMAL HIGH (ref 40–60)
Ldl Cholesterol, Calc: 71 mg/dL (ref 0–100)
Triglycerides: 51 mg/dL (ref 0–200)
VLDL Cholesterol, Calc: 10 mg/dL (ref 5–40)

## 2012-09-25 LAB — URINALYSIS, COMPLETE
Bilirubin,UR: NEGATIVE
Blood: NEGATIVE
Glucose,UR: NEGATIVE mg/dL (ref 0–75)
Ketone: NEGATIVE
Leukocyte Esterase: NEGATIVE
Nitrite: NEGATIVE
Ph: 6 (ref 4.5–8.0)
Protein: NEGATIVE
RBC,UR: 1 /HPF (ref 0–5)
Specific Gravity: 1.005 (ref 1.003–1.030)
Squamous Epithelial: 1
WBC UR: 1 /HPF (ref 0–5)

## 2012-10-03 NOTE — Discharge Summary (Signed)
Physician Discharge Summary Note  Patient:  [name] is an 51 y.o., female MRN:  [id number] DOB:  19-Oct-1961 Patient phone:  [phone] (home)  Patient address:   [address] [place] [address]   Date of Admission:  08/18/2012 Date of Discharge: 08/25/2012  Discharge Diagnoses: Active Problems:  Generalized anxiety disorder  Axis Diagnosis:  Axis I: Generalized Anxiety Disorder and Bipolar disorder, unspecified  Axis II: Deferred  Axis III: History reviewed. No pertinent past medical history.  Axis IV: other psychosocial or environmental problems  Axis V: 51-60 moderate symptoms  Level of Care:  OP  Hospital Course:   This is a 51 year old caucasian female, admitted to [facility] from the [facility] ED with reports of suicide attempt by over dose on Asprin and Benadryl. Patient has history of prior suicide attempt x 4. Patient reports, "My sister took me to [facility] on the 24th of this month. I had taken to much Asprin and Benadryl pills. I was tired of feeling tired. I am just not feeling rested. I feel exhausted. I work second shift. I think I'm both depressed and tired. My depression has been going on and off for while, not so much suicidal, I was rather trying to sleep and get some rest. After I took the medicines my mother came home and found me in bed. She called my sister and she came by and took me to the hospital".   While a patient in this hospital, [name] was enrolled in group counseling and activities as well as received the following medication No current facility-administered medications for this encounter. Current outpatient prescriptions:calcium-vitamin D (OSCAL WITH D) 500-200 MG-UNIT per tablet, Take 1 tablet by mouth daily. For Vitamin D replacement and mood control., Disp: 30 tablet, Rfl: 0;  hydrOXYzine (ATARAX/VISTARIL) 25 MG tablet, Take 37.5 mg by mouth 3 (three) times daily as needed. For anxiety., Disp: , Rfl:  lamoTRIgine  (LAMICTAL) 25 MG tablet, Take 1 tablet (25 mg total) by mouth daily. For mood control. Must start at low dose and work way up gradually., Disp: 30 tablet, Rfl: 0;  lisinopril (PRINIVIL,ZESTRIL) 10 MG tablet, Take 1 tablet (10 mg total) by mouth daily. For control of high blood pressure, Disp: 30 tablet, Rfl: 0;  loratadine (CLARITIN) 10 MG tablet, Take 10 mg by mouth daily as needed. For allergies., Disp: , Rfl:  Multiple Vitamin (MULTIVITAMIN WITH MINERALS) TABS, Take 1 tablet by mouth daily. For nutritional supplementation., Disp: 30 tablet, Rfl: 0;  QUEtiapine (SEROQUEL) 12.5 mg TABS, Take 0.5 tablets (12.5 mg total) by mouth 3 (three) times daily. For anxiety, Disp: 45 tablet, Rfl: 0;  QUEtiapine (SEROQUEL) 400 MG tablet, Take 1 tablet (400 mg total) by mouth at bedtime. For mood control and insomnia, Disp: 30 tablet, Rfl: 0  Patient attended treatment team meeting this am and met with treatment team members. Pt symptoms, treatment plan and response to treatment discussed. [name] endorsed that their symptoms have improved. Pt also stated that they are stable for discharge.  In other to control Active Problems:  Generalized anxiety disorder , they will continue psychiatric care on outpatient basis. They will follow-up at  Follow-up Information    Follow up with [facility]. On 08/26/2012. (You are scheduled with Andres Ege at [facility] on Friday, August 26, 2012 at 12:45 PM)    Contact information:   [address] [place], [place]  [address]  [phone]       .  In addition they were instructed to take all your medications as prescribed by your mental healthcare provider, to report any adverse effects and or reactions from your medicines to your outpatient provider promptly, patient is instructed and cautioned to not engage in alcohol and or illegal drug use while on prescription medicines, in the event of worsening symptoms, patient is instructed to call the crisis hotline, 911 and  or go to the nearest ED for appropriate evaluation and treatment of symptoms.   Upon discharge, patient adamantly denies suicidal, homicidal ideations, auditory, visual hallucinations and or delusional thinking. They left [facility] with all personal belongings in no apparent distress.  Consults:  See electronic record for details  Significant Diagnostic Studies:  See electronic record for details  Discharge Vitals:   Blood pressure 128/71, pulse 78, temperature 97.4 F (36.3 C), temperature source Oral, resp. rate 16, height 5' (1.524 m), weight 124 lb (56.246 kg), last menstrual period 07/18/2012..  Mental Status Exam: See Mental Status Examination and Suicide Risk Assessment completed by Attending Physician prior to discharge.  Discharge destination:  Home  Is patient on multiple antipsychotic therapies at discharge:  No  Has Patient had three or more failed trials of antipsychotic monotherapy by history: N/A Recommended Plan for Multiple Antipsychotic Therapies: N/A    Medication List     As of 10/03/2012 12:54 PM    TAKE these medications      Indication    calcium-vitamin D 500-200 MG-UNIT per tablet   Commonly known as: OSCAL WITH D   Take 1 tablet by mouth daily. For Vitamin D replacement and mood control.       lamoTRIgine 25 MG tablet   Commonly known as: LAMICTAL   Take 1 tablet (25 mg total) by mouth daily. For mood control. Must start at low dose and work way up gradually.       lisinopril 10 MG tablet   Commonly known as: PRINIVIL,ZESTRIL   Take 1 tablet (10 mg total) by mouth daily. For control of high blood pressure       multivitamin with minerals Tabs   Take 1 tablet by mouth daily. For nutritional supplementation.       QUEtiapine 400 MG tablet   Commonly known as: SEROQUEL   Take 1 tablet (400 mg total) by mouth at bedtime. For mood control and insomnia       QUEtiapine 12.5 mg Tabs   Commonly known as: SEROQUEL   Take 0.5 tablets (12.5 mg total) by mouth  3 (three) times daily. For anxiety            Follow-up Information    Follow up with [facility]. On 08/26/2012. (You are scheduled with Andres Ege at [facility] on Friday, August 26, 2012 at 12:45 PM)    Contact information:   [address] [place], [place]  [address]  [phone]        Follow-up recommendations:   Activities: Resume typical activities Diet: Resume typical diet Tests: none Other: Follow up with outpatient provider and report any side effects to out patient prescriber.  Comments:  Take all your medications as prescribed by your mental healthcare provider. Report any adverse effects and or reactions from your medicines to your outpatient provider promptly. Patient is instructed and cautioned to not engage in alcohol and or illegal drug use while on prescription medicines. In the event of worsening symptoms, patient is instructed to call the crisis hotline, 911 and or go to the nearest  ED for appropriate evaluation and treatment of symptoms. Follow-up with your primary care provider for your other medical issues, concerns and or health care needs.  SignedDan Humphreys, Kimbly Eanes 10/03/2012 12:54 PM

## 2013-05-01 ENCOUNTER — Other Ambulatory Visit: Payer: Self-pay | Admitting: Obstetrics and Gynecology

## 2013-05-01 DIAGNOSIS — Z1231 Encounter for screening mammogram for malignant neoplasm of breast: Secondary | ICD-10-CM

## 2013-05-01 DIAGNOSIS — E2839 Other primary ovarian failure: Secondary | ICD-10-CM

## 2013-05-29 ENCOUNTER — Ambulatory Visit: Payer: Self-pay

## 2013-05-29 ENCOUNTER — Other Ambulatory Visit: Payer: Self-pay

## 2013-06-15 ENCOUNTER — Ambulatory Visit
Admission: RE | Admit: 2013-06-15 | Discharge: 2013-06-15 | Disposition: A | Discharge status: Home / Self Care | Payer: Self-pay | Source: Ambulatory Visit | Attending: Obstetrics and Gynecology | Admitting: Obstetrics and Gynecology

## 2013-06-15 DIAGNOSIS — E2839 Other primary ovarian failure: Secondary | ICD-10-CM

## 2013-06-15 DIAGNOSIS — Z1231 Encounter for screening mammogram for malignant neoplasm of breast: Secondary | ICD-10-CM

## 2014-11-01 ENCOUNTER — Other Ambulatory Visit: Payer: Self-pay | Admitting: Gynecology

## 2014-11-01 DIAGNOSIS — R928 Other abnormal and inconclusive findings on diagnostic imaging of breast: Secondary | ICD-10-CM

## 2014-11-21 ENCOUNTER — Ambulatory Visit
Admission: RE | Admit: 2014-11-21 | Discharge: 2014-11-21 | Disposition: A | Discharge status: Home / Self Care | Payer: Commercial Managed Care - PPO | Source: Ambulatory Visit | Attending: Gynecology | Admitting: Gynecology

## 2014-11-21 DIAGNOSIS — R928 Other abnormal and inconclusive findings on diagnostic imaging of breast: Secondary | ICD-10-CM

## 2015-03-12 NOTE — Discharge Summary (Signed)
PATIENT NAME:  [name], [name] MR#:  [id number] DATE OF BIRTH:  03/15/1961  DATE OF ADMISSION:  08/30/2012 DATE OF DISCHARGE:  09/30/2012  HOSPITAL COURSE: See dictated History and Physical for details of admission. This 54 year old woman was admitted to the hospital after making a serious suicide attempt by stabbing herself with a pair of scissors with follow-up of trying to stab herself in other ways when she was stopped and even trying to electrocute herself. She was treated in the hospital for psychosis and depressed mood and anxiety. The patient's history was of several hospitalizations, one after another over the past several months. She has shown some improvement at times but has also been frequently noncompliant with treatment. She has made multiple serious suicide attempts. In the hospital, the patient was initially difficult to communicate with. She remained withdrawn and appeared psychotic. She frequently indicated that she was feeling like people were reading her mind or that other strange things were going on. She was not able to articulate much about what was going on with her. She frequently complained of anxiety. Treatment initially focused on psychosis but depression as well. It appeared that she had been unresponsive to Seroquel from the history I been given, and so we used Zyprexa as a primary antipsychotic. She was engaged in groups regularly. She was slow to respond for quite a while.  She frequently requested discharge but still seemed to not be thinking clearly, and her affect was very blunted. Family was involved in treatment and gave a history that the patient was extremely impaired and off of her baseline. They were very worried about her safety outside the hospital, appropriately so. It turned out that a big stress in her life was probably her fairly recent break-up from boyfriend which seems to induce a huge amount of anxiety in her. She was ultimately able to discuss this and started to  be able to discuss things more appropriately in groups in general. At one point we considered discharging her to live back with her mother, but at that point the family made it clear that they did not believe that she was safe living with her mother and would not accept her going back to stay there.   We communicated to them that if that was the case that homeless shelters were probably her only option. The family was dissatisfied with this but initially remained steadfast in not wanting her to go back home with her mother. After that, I made a decision to change her antipsychotic and gradually cross tapered her to Seroquel. This was based on more detailed information I got that actually higher dose Seroquel seems to have been beneficial for her mood in the past when she was compliant with it. The patient did seem to get better once we made the switch to Seroquel whether that was causative or not. Over the last week or so, she has shown a great deal of improvement from her admission status. She has shown a more open, upbeat, calm and normal affect. She has denied psychotic symptoms and showed better insight. She has gone to groups more, interacted more with other patients better, generally seemed to be thinking much better. She has been able to articulate that she clearly does not want to die and wants to try to move on with her life and get past the recent loss of this boyfriend. She has been educated extensively about her illness and the importance of staying on medication long term  even when she  is feeling good to avoid relapse of her symptoms and she understands this. In the last few days of her hospitalization, it transpired that the mother announced that she would let the patient come back to stay at home, and the patient announced that that was her preference. A final family meeting was held at which the family understood that the mother actually did want the patient back home and relented and agreed to  try this out. We have fortunately managed to get aggressive community treatment services in place to come and see the patient at home to help out with her stability, given her recent intractable illness. The patient is completely agreeable to working with this.   LABORATORY, DIAGNOSTIC AND RADIOLOGICAL DATA: Admission labs showed a chemistry that was normal. Acetaminophen was nondetected. CBC showed an elevated white count at 13.5, otherwise normal. Salicylates were not detected. Urinalysis showed positive leukocyte esterase, positive white blood cells, likely infection. Drug screen was positive for tricyclic antidepressants possibly related to the Seroquel. Pregnancy test was negative. X-ray of her right wrist was normal. This was because she had been pounding her wrists on admission. Follow-up urinalysis remained still infected on the 10th. Lipids were checked and showed a good lipid level with cholesterol only 144, triglycerides only 51, HDL elevated at 63 which was very reassuring for the use of antipsychotics. After treatment with antibiotics, her urinalysis cleared to where it  was completely normal on November 3rd.   DISCHARGE MEDICATIONS:  1. Quetiapine 400 mg at bedtime.  2. Docusate 200 mg per day.  3. Neurontin 600 mg 3 times a day. 4. Prozac 40 mg per day. 5. Multivitamin one per day. 6. Vistaril 75 mg every 6 hours p.r.n. for anxiety.   MENTAL STATUS EXAM AT DISCHARGE: Neatly dressed, well groomed woman who looks her stated age and is cooperative and appropriate with the interview. Makes good eye contact. Psychomotor activity is active but normal and appropriate. Speech is normal and appropriate in tone and amount. Affect shows smiling, appropriate interaction, appropriate tone. Mood is stated as good and happy to be going home. Thoughts were lucid with no sign of bizarre thinking or loosening of associations. She denied auditory or visual hallucinations. She denied suicidal or homicidal  ideation and stated multiple things in her life that she was looking forward to and she had to live for, including her relationship with her family and especially repairing her relationships with her children, trying to get back to work and improving her physical health. The patient showed good insight and judgment. She was alert and oriented x4. Baseline intelligence was normal.   DISPOSITION: She is discharged home to stay with her mother. Assertive community treatment is provided which will meet with her right away and on a frequent basis.   DIAGNOSIS, PRINCIPAL AND PRIMARY:  AXIS I: Major depression severe, recurrent, psychotic.   SECONDARY DIAGNOSIS: AXIS I:  No further.   AXIS II: Deferred.   AXIS III:  1. Urinary tract infection treated and resolved.  2. Superficial lacerations to the skin, resolved.   AXIS IV: Moderate. Ongoing stress from lack of financial resources.   AXIS V: Functioning at time of discharge 55.   ____________________________ Audery AmelJohn T. Clapacs, MD jtc:cbb D: 09/30/2012 15:02:06 ET T: 10/01/2012 15:45:52 ET JOB#: [id number]  cc: Audery AmelJohn T. Clapacs, MD, <Dictator> Audery AmelJOHN T CLAPACS MD ELECTRONICALLY SIGNED 10/03/2012 10:48

## 2015-03-12 NOTE — H&P (Signed)
PATIENT NAME:  [name], [name] MR#:  [id number] DATE OF BIRTH:  1961-08-31  DATE OF ADMISSION:  08/30/2012  IDENTIFYING INFORMATION AND CHIEF COMPLAINT: 54 year old woman admitted through the Emergency Room where she presented with cuts to her wrists.   CHIEF COMPLAINT: "I cut my wrists".  HISTORY OF PRESENT ILLNESS: History obtained from the patient and from the chart. The patient says that yesterday she cut herself on both of her wrists and also cut herself on her neck using a pair of scissors. She recalls feeling overwhelmed by a conversation that she was having with her mother. She is hesitant to talk about or cannot remember the precise content. She claims that she was not actually thinking about killing herself when she did it but she cannot recall exactly what she was thinking. The cuts that she made to her wrists actually required sutures and the cuts to her neck are pretty ugly. She says that for the last few days her mood has been mostly anxious. She indicates that she feels tired much of the time. Has had a decreased appetite. She initially denies but later admits that she has auditory hallucinations and also that she sometimes feels like people are doing mind reading on her or that there are messages coming from the TV. She indicates that she has been taking her prescribed medication, which is Seroquel XR 400 mg at night and Vistaril 75 mg twice a day as needed, although she says she tries to minimize the use of Vistaril. She denies that she has been using alcohol or drugs. She was just discharged from the hospital, possibly at [facility], about a week ago and has been out of the hospital for less than a week.   PAST PSYCHIATRIC HISTORY: The patient has a history of depression going back to her teenage years. She had psychiatric hospitalizations at the [facility] and also one some years ago at [facility] for an overdose. Apparently these were attributed to being substance-induced. Subsequently,  she saw a psychiatrist for about 20 years during which time apparently she was not hospitalized. Since early 2013 she has had multiple psychiatric hospitalizations. She had an extended hospitalization at our facility in March. She returned with suicidal behavior very shortly after discharge. She indicates that since then she has had hospitalizations at [facility] and at [facility] and was just recently discharged from [facility]. She has a history of multiple suicide attempts by overdose and self-mutilation. Her diagnosis, especially this year, has been unclear. She has been diagnosed variously with major depression with psychotic features, bipolar disorder, schizoaffective disorder. She indicates that she has been on multiple medications in the past including multiple antipsychotic medicine, lithium, Depakote, several antidepressants. It is not entirely clear which medicines have been most effective for her. In the last year she has been treated with primarily antipsychotics and antidepressants. She does not appear to have achieved any real stability over the last few months. The patient has presented with psychotic symptoms several times in the past.   PAST MEDICAL HISTORY: The patient has acute injuries to her wrists and neck. Has gastric reflux symptoms but no other ongoing medical problems.   SOCIAL HISTORY: She is currently living with her mother. The patient has been involved in an inflicted relationship with a boyfriend during this year. She is not discussing it with me at this time but does indicate that it's still something she worries about. She has evidently felt a lot of guilt about it in  the past. She is not currently working. She says she spends most of her time just sitting around her house with her mother.   FAMILY HISTORY: There is a family history of depression and bipolar disorder and several people with alcohol dependence.   SUBSTANCE ABUSE HISTORY: The patient used marijuana when she  was teenager. She had attributed previous psychotic episodes to marijuana. Denies that she is abusing any drugs anytime recently or abusing alcohol.   CURRENT MEDICATIONS:  1. Seroquel extended-release 400 mg at night.  2. Vistaril 75 mg b.i.d. p.r.n. anxiety.   ALLERGIES: She reports having had an allergic reaction to lithium in the past. Also, allergic to sulfa drugs.   REVIEW OF SYSTEMS: Mood feels anxious. Sleep and appetite decreased. Auditory hallucinations, ideas of reference, and paranoia all present. Recent suicidal ideation but denies any suicidal ideation today. No specific physical complaints.   MENTAL STATUS EXAMINATION: Somewhat disheveled thin woman. She spends most of her time pacing in the halls, looks very agitated and anxious. She is able to sit down and complete an interview. Eye contact is poor. She darts her eyes around the room constantly. Psychomotor activity is fairly rigid. Speech is very quiet, somewhat difficult to understand. Decreased in total amount. Affect blunted. Mood stated as anxious. Thoughts showed some poverty of thinking and elaboration. Somewhat disorganized. She admits to having auditory hallucinations as well as ideas of reference. Denies any current suicidal or homicidal ideation. Judgment and insight recently have been poor. Intelligence probably baseline normal but hard to assess right now. Short-term memory grossly intact.   PHYSICAL EXAMINATION:   GENERAL: The patient weighs 118 pounds. Looks somewhat thin but not emaciated. She has bandages on both of her wrists covering where she has had sutures. Her neck has multiple deep cuts without sutures especially on the left side suggesting self-inflicted cuts using her right hand.   HEENT: Pupils are equal and reactive. Face is otherwise symmetric. Oral mucosa normal.   NECK: Supple.   BACK: Nontender.   MUSCULOSKELETAL: Full range of motion at all extremities. Normal gait.   LUNGS: Clear with no  wheezes.   HEART: Regular rate and rhythm.   ABDOMEN: Nontender. Normal bowel sounds.   VITAL SIGNS: Temperature 99.1, pulse 95, respirations 20, blood pressure 122/86.   LABORATORY RESULTS: Drug screen positive only for tricyclic antidepressants probably representing the Seroquel. Chemistry panel normal. The hematology panel showed an elevated white count at 19.5. Urinalysis appears to be likely for a urinary tract infection with high numbers of white blood cells and positive leukocyte esterase. Pregnancy test negative. Salicylates and acetaminophen negative.   ASSESSMENT: This is a 54 year old woman who presents with self-mutilating behavior, subjective complaints of anxiety, subjective complaints of psychotic symptoms, and a mental status exam most notable for psychotic behavior. Diagnosis potentially major depression with psychotic features versus schizophrenia. No evidence for substance-induced or medical-induced psychosis.   TREATMENT PLAN:  1. Reviewed past treatment with the patient.  2. Discussed medication options.  3. Supportive and reassuring therapy done.  4. I am going to start her on Zyprexa rather than Seroquel. Discontinue the Seroquel which has evidently not been effective at this dose. Start Zyprexa 10 mg at night. I am also going to restart antidepressants, particularly fluoxetine 20 mg a day, which she has taken in the past.  5. P.r.n. Vistaril available.  6. The patient will be monitored daily for side effects of medicine and improvement in symptoms.  7. She will be evaluated by  Social Work and will be involved in therapeutic groups and activities.  8. I have also brought up with the patient the possibility of ECT but we agreed to a medication trial first.   DIAGNOSIS PRINCIPLE AND PRIMARY:  AXIS I: Psychosis, not otherwise specified.   SECONDARY DIAGNOSES:  AXIS I: Rule out major depression, severe, with psychotic features.   AXIS II: Deferred.   AXIS III:   1. Gastric reflux symptoms. 2. Acute injuries, self-inflicted, to wrists and neck.   AXIS IV: Severe from minimal resources, ongoing illness, burden of illness.   AXIS V: Functioning at time of evaluation 20.   ____________________________ Audery Amel, MD jtc:drc D: 08/31/2012 20:08:59 ET T: 09/01/2012 05:39:58 ET JOB#: [id number]  cc: Audery Amel, MD, <Dictator> Audery Amel MD ELECTRONICALLY SIGNED 09/01/2012 10:02

## 2015-03-17 NOTE — H&P (Signed)
PATIENT NAME:  [name], LUBBERS MR#:  [id number] DATE OF BIRTH:  [birth year]  DATE OF ADMISSION:  01/27/2012  REFERRING PHYSICIAN: Gaetano Net, DO  ADMITTING PHYSICIAN: Caryn Section, MD   REASON FOR ADMISSION: Suicidal thoughts.   IDENTIFYING INFORMATION: [name] is a 54 year old engaged Caucasian female currently living in the [place] area with her mother. She is unemployed and has two children, a 92 year old daughter and a 9 year old son. She is currently being supported by her mother as well as her fiancee. She has been engaged since Valentine's Day.  HISTORY OF PRESENT ILLNESS: [name] is a 54 year old engaged Caucasian female with a history of postpartum depression and recurrent depression since the birth of her first child over 20 years ago who presented to the emergency room voluntarily on her own brought in by a friend from church secondary to suicidal thoughts and a fear that she may do something to hurt herself. The patient reports having had three overdoses within the past three weeks. She says that she overdosed on 50 pills of Klonopin three weeks ago and then two weeks ago drank a bottle of Benadryl in a suicide attempt. She says then one week ago she also tried to take 15 Benadryl tablets and drink liquor in combination with that. She does endorse feelings of hopelessness and helplessness, frequent crying spells, decreased energy level, anhedonia, insomnia, and decreased appetite over the past 3 to 4 weeks. The patient cannot identify specific triggers for worsening depressive symptoms and current suicidal thoughts. She says that her boyfriend proposed to her on Valentine's Day which she was very excited about, but then the day after Valentine's Day her mood plummeted and she has been depressed since. She says that she feels like she is on emotional roller coaster and mood only improves when she talks about the wedding. She and her fiancee have not yet set a wedding date. The only recent  stressor for her, other than being unemployed, is the fact that her mother with whom she lives with does not get along with her fiancee and was not happy with the engagement. She has struggled with anxiety in the past but denies any history of any panic attacks. She denies any history of any auditory or visual hallucinations in the past. She does report feeling mildly paranoid and says that she sometimes feels like things that she sees on television cause her to be extremely anxious and worried. No delusional thoughts. She denies any heavy alcohol use or illicit drug use. The patient was seeing Dr. Lafayette Dragon for over 20 years for medication management and now gets an antidepressant medication from her primary care physician as she stopped seeing Dr. Lafayette Dragon two years ago. She is not currently seeing a therapist either.   PAST PSYCHIATRIC HISTORY: The patient has been hospitalized twice in the past at [facility], once for an overdose and once for a substance-induced psychotic disorder when she took some marijuana that was laced with something. She also reports being hospitalized at [facility] for a three week period in her late teens as well when she ingested the laced marijuana. She was seeing Dr. Lafayette Dragon for outpatient psychotropic medication management for 20 years, but quit seeing her two years ago. She is currently on a combination of Prozac 40 mg p.o. daily for the past 1-1/2 years and Klonopin 0.5 mg p.o. twice a day, both prescribed by her primary care physician. Past psychotropic medication trials include Celexa, Cymbalta, Wellbutrin, lithium, and Abilify. She says that she had  a rash with lithium and could not take the Abilify and the Abilify was too excessive for her.  SUBSTANCE ABUSE HISTORY: The patient reports using marijuana in her late teens and says that it was laced with something which caused her to be psychotic for a brief period of time and she was hospitalized. She denies any marijuana use since. She  denies any heavy alcohol use, cocaine, opiate, or stimulant use. She denies any prescription narcotic abuse. She denies any tobacco use.   FAMILY PSYCHIATRIC HISTORY: The patient has an aunt with bipolar disorder and an uncle who committed suicide, who also had bipolar disorder. She says that there are several uncles who also struggle with alcoholism.   PAST MEDICAL HISTORY: She denies any major medical conditions. She denies any history of any TBI or seizures.   OUTPATIENT MEDICATIONS:  1. Prozac 40 mg p.o. daily for the past 1-1/2 years.  2. Klonopin 0.5 mg p.o. twice a day.  ALLERGIES: Sulfas and lithium. The patient does get a rash with lithium.   SOCIAL HISTORY: The patient was born in [place] and raised by both her biological parents until her father passed away when she was 56 years old. She says her father died suddenly of a heart attack. She was raised by her mother thereafter. She currently lives with her mother in the [place] area. She denies any history of any physical or sexual abuse. She does have two brothers and one sister. 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 The patient has been divorced for the past three years but then got engaged to her boyfriend of two years on Valentine's Day. She has a 58 year old daughter and a 31 year old son. She says that both of the children are very supportive. She and her mother live in the [place] area.   LEGAL HISTORY: The patient denies any history of any arrests or incarcerations.   MENTAL STATUS EXAM: [name] is a 54 year old Caucasian female with shoulder length brown hair who was wearing burgundy scrub pants and a lime green shirt. She was fully alert and oriented to time, place, and situation. Speech was slow and soft but fluent and coherent. Mood was depressed and affect was  depressed and tearful, especially when talking about her mother not liking her fiancee. Thought processes were linear, logical, and goal-directed. She does endorse some suicidal thoughts currently but was able to contract for safety inside of the hospital. She says the suicidal thoughts are more passive and she generally wants to be able to get on the right medication to help with the depression. She denies any homicidal thoughts or psychotic symptoms including auditory or visual hallucinations. She denies any current paranoid thoughts or delusions. Attention and concentration are good. Recall was three out of three initially and three out of three after 5 minutes. Judgment and insight are good. She did not have any difficulty naming the presidents all the way back to Reagan and could spell world backwards correctly. Abstraction was good.   SUICIDE RISK ASSESSMENT: At this time, Ms. Brassfield remains at a moderately elevated risk of harm to self and others secondary to suicidal thoughts and inability to contract for safety outside of the hospital. She has also recently had three suicide attempts. She is unemployed and there has been some family conflict between her mother and her  fiancee. She denies having any access to guns.   REVIEW OF SYSTEMS: CONSTITUTIONAL: She denies any weakness, fatigue or weight changes. She denies any fever, chills, or night sweats. HEAD: She denies any headaches or dizziness. EYES: She denies any diplopia or blurred vision. ENT: She denies any hearing loss or difficulty swallowing. RESPIRATORY: She denies any shortness breath or cough. CARDIOVASCULAR: She denies any chest pain or orthopnea. GI: She denies any abdominal pain, nausea or vomiting. She denies any change in bowel movements. GENITOURINARY: She denies incontinence or problems with frequency of urine. ENDOCRINE: She denies any heat or cold intolerance. LYMPHATIC: She denies any anemia or easy bruising. MUSCULOSKELETAL: She denies  any muscle or joint pain. NEUROLOGICAL: She denies any tingling or weakness. PSYCHIATRIC: Please see history of present illness.    PHYSICAL EXAMINATION:   VITAL SIGNS:  BP:  142/89    P: 84    R: 22   T: 97.6  HEENT: Normocephalic, atraumatic. Pupils equal, round, and reactive to light and accommodation. Extraocular movements are intact. Oral mucosa was moist. No lesions noted.   NECK: Supple. No cervical lymphadenopathy or thyromegaly is present.   LUNGS: Clear to auscultation bilaterally. No crackles, rales, or rhonchi.   CARDIAC: S1 and S2 present. No rubs or gallops.   ABDOMEN: Soft with normoactive bowel sounds present in all four quadrants. No masses noted. No tenderness noted.   EXTREMITIES: +2 pedal pulses bilaterally. No rashes, clubbing, or edema.   NEUROLOGICAL: Cranial nerves II through XII grossly intact. Gait was normal and steady. No hypo or hyperreflexia noted in lower extremities bilaterally.   LABS/STUDIES: BMP, LFTs, and CBC are within normal limits. TSH within normal limits. Ethanol level less than 3. Acetaminophen and salicylate level are unremarkable. Urine drug screen is pending at the time of admission.   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   AXIS II: Deferred.   AXIS III: No major medical conditions.   AXIS IV: Mild to moderate - Unemployed, conflict between mother and fiancee.   AXIS V: GAF at present equals 25.   ASSESSMENT AND TREATMENT RECOMMENDATIONS: [name] is a 54 year old currently engaged Caucasian female with a history of recurrent depression who came to the hospital voluntarily on her own reporting suicidal thoughts. She reports having had three suicide attempts by overdose in the past three weeks and is unable to contract for safety outside of the hospital. No significant psychotic symptoms. We will admit to inpatient psychiatry  for medication management, safety, and stabilization and place on suicide precautions and close observation.  1. Major depressive disorder, recurrent, without psychotic features: We will plan to discontinue Prozac as the patient has been on the medication for the past 1-1/2 years without any significant improvement and start Zoloft 50 mg p.o. daily. We will also plan to add trazodone 50 mg p.o. at bedtime for insomnia and continue Klonopin 0.5 mg p.o. twice a day for anxiety. We will check B12 and folic in the a.m. 2. Rule out substance use: Urine drug screen is currently pending 3. Disposition: Would recommend that the patient establish services [place] [place] with [facility] or [facility] so that she can have both an individual therapist as well as a psychiatrist for medication management. We will try to gain collateral information from the patient's mother and fiancee as well as the patient is denying significant triggers other than conflict between the  fiancee and her mother. The patient will sign into the hospital voluntarily.  ____________________________ Doralee Albino. Maryruth Bun, MD akk:slb D: 01/27/2012 15:20:10 ET T: 01/27/2012 15:44:56 ET JOB#: [id number]  cc: Naliya Gish K. Maryruth Bun, MD, <Dictator> Darliss Ridgel MD ELECTRONICALLY SIGNED 01/28/2012 12:49

## 2015-03-17 NOTE — H&P (Signed)
PATIENT NAME:  [name], Jacqueline MR#:  [id number] DATE OF BIRTH:  30-Jul-1961  DATE OF ADMISSION:  02/09/2012  REFERRING PHYSICIAN: Dr. Joseph ArtAlice Finnell  ADMITTING PHYSICIAN: Dr. Caryn SectionAarti Rodman Recupero   REASON FOR ADMISSION: Status post overdose and possible suicide attempt.   CHIEF COMPLAINT: "I just wanted to sleep. I took too many sleeping pills."   HISTORY OF PRESENT ILLNESS: [name] is a 54 year old currently engaged Caucasian female with a history of bipolar disorder as well as polysubstance abuse who presented voluntarily on her own to the Emergency Room accompanied by her mother after the patient overdosed on what she reported as being trazodone pills, approximately 10 to 15 pills. The patient was just discharged from [facility] just last week on Friday after having had an overdose and struggling with extreme anxiety. The patient was discharged on a combination of Depakote, Zoloft and trazodone. The patient reports that she went to see a psychiatrist as well as a [facility] and was feeling fine but then later had difficulty with racing thoughts and was unable to stop thinking about her ex-husband who has been trying to reconcile with her. The patient is currently engaged to another man and she has a lot of conflict and excessive guilt about living with him or having sex with him prior to marriage. Affect was blunted during the interview. Patient did report depressive symptoms. She denied any active suicidal thoughts and stated that she was not attempting to kill herself, although she has difficulty stopping herself from these impulsive overdoses. She is denying any current auditory or visual hallucinations. No paranoid thoughts or delusions. Patient denies any recent heavy alcohol use or illicit drug use. Toxicology screen in the Emergency Room was negative for all substances and ethanol level was less than 3.   PAST PSYCHIATRIC HISTORY: Patient has  been hospitalized three times total place, twice at [facility] and once at [facility], just discharged last week. She was also hospitalized at [facility] for a three week period in her late teens and has a history of substance-induced psychotic disorder when she ingested laced marijuana. The patient saw Dr. Lafayette Dragonarr for outpatient psychotropic medication management for 20 years but quit seeing her two years ago. She just recently re-established services at [facility] [place]. Past psychotropic medications include Celexa, Cymbalta, Wellbutrin, lithium and Abilify. She was just recently started on Depakote and Zoloft during her last hospitalization.   SUBSTANCE ABUSE HISTORY: The patient reports history of marijuana use in her late teens. She denies any marijuana use since. She does have a history of benzodiazepine abuse and stimulant use in the past including Adderall use and Klonopin use. She also abused opioids briefly as well. She denies any cocaine or heavy alcohol use.   FAMILY PSYCHIATRIC HISTORY: Patient has an aunt with bipolar disorder and an uncle who committed suicide. Patient also reports having had several uncles who struggle with alcoholism.   PAST MEDICAL HISTORY: She denies any major medical conditions. She denies any history of any TBI or seizures.   OUTPATIENT MEDICATIONS:  1. Depakote ER 1000 mg p.o. nightly. 2. Trazodone 50 mg p.o. at bedtime.  3. Zoloft 150 mg p.o. daily. 4. Hydroxyzine 25 mg 3 times a day p.r.n.  5. Multivitamin 1 tablet a day.   ALLERGIES: Sulfa and lithium. Patient got a rash with lithium.   SOCIAL HISTORY: Patient is currently engaged but living with her mother. She says her fiancee proposed on Valentine's Day.  She has a 66 year old daughter and a 46 year old son and has been divorced for several years. 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 She has been unemployed since October 2011 but  completed a CNA class.   LEGAL HISTORY: Patient denies any history of any arrests or incarcerations.   MENTAL STATUS EXAM: [name] is a 54 year old Caucasian female with shoulder length brown hair who is wearing burgundy scrub pants and a lime green shirt. She was fully alert and oriented to time, place, and situation although thought processes were somewhat slowed. Affect was blunted and mood was depressed. Thought processes were slow but goal directed and logical. She denied any current suicidal thoughts or homicidal thoughts. She denied any current auditory or visual hallucinations. She denied any paranoid thoughts or delusions. Attention and concentration were fair. Recall was three out of three initially and two out of three after five minutes. Abstraction was good. The patient could do serial threes without difficulty but had difficulty with serial sevens.   SUICIDE RISK ASSESSMENT: At this time Ms. Yetman remains at a moderately elevated risk of harm to self and others as she has had close to four suicide attempts within the past 1 to 2 months. The patient remains conflicted between reconciling with her ex-husband and getting married.    REVIEW OF SYSTEMS: CONSTITUTIONAL: She denies any weakness, fatigue or weight changes. She denies any fever, chills, or night sweats. HEAD: She does complain of a headache and some mild dizziness. EYES: She denies any diplopia or blurred vision. ENT: She denies any hearing loss or difficulty swallowing. RESPIRATORY: She denies any shortness of breath or cough. CARDIOVASCULAR: She denies any chest pain or orthopnea. GASTROINTESTINAL: She does complain of diarrhea over the past two days approximately 6 to 7 loose bowel movements per day. She denies any nausea, vomiting, or abdominal pain. GENITOURINARY: She denies incontinence or problems with frequency of urine. ENDOCRINE: She denies any heat or cold intolerance. LYMPHATIC: She denies any anemia or easy bruising.  MUSCULOSKELETAL: She denies any muscle or joint pain. NEUROLOGICAL: She denies any tingling or weakness. PSYCHIATRIC: Please see history of present illness.   PHYSICAL EXAMINATION:  VITAL SIGNS: Blood pressure 131/89, heart rate 89, respirations 20, temperature 99.4.   HEENT: Normocephalic, atraumatic. Pupils equal, round, and reactive to light and accommodation. Extraocular movements intact. Oral mucosa was moist. No lesions noted.   NECK: Supple. No cervical lymphadenopathy or thyromegaly present.   LUNGS: Clear to auscultation bilaterally. No crackles, rales, or rhonchi.   CARDIAC: S1, S2, present. Regular rate and rhythm. No murmurs, rubs, or gallops.   ABDOMEN: Soft. Normoactive bowel sounds present in all four quadrants. No masses noted. No tenderness noted.   EXTREMITIES: +2 pedal pulses bilaterally. No rashes, clubbing, or edema.   NEUROLOGIC: Cranial nerves II through XII are grossly intact. Gait was normal and steady but slow. No hypo or hyperreflexia noted.   LABORATORY, DIAGNOSTIC AND RADIOLOGICAL DATA: Valproic acid level 287. BMP within normal limits. LFTs within normal limits. TSH 1.62. CBC within normal limits. Urine tox screen was negative for all substances. Urinalysis was nitrite and leukocyte esterase negative, 1 WBC, no bacteria. Pregnancy test was negative.   DIAGNOSES:  AXIS I:  1. Bipolar disorder, most recent episode depressed.  2. Panic disorder, not otherwise specified.   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   AXIS III: No major medical conditions.   AXIS IV: Moderate-Unemployed,  conflict between ex-husband and fiancee.   AXIS V: GAF at present equals 20 at admission.   ASSESSMENT AND TREATMENT RECOMMENDATIONS: Ms. Magouirk is a 54 year old currently engaged Caucasian female with history of bipolar disorder and panic disorder who presented to the Emergency Room after having overdosed on trazodone per her report. It does appear as if the  patient may have overdosed on Depakote given valproic acid level of 287 versus the trazodone. She is unsure but continues to believe that it was trazodone that she took. Will admit to inpatient psychiatry for medication management, safety, and stabilization and will ask for medicine consult secondary to Depakote toxicity. 1. Bipolar disorder type II and panic disorder, not otherwise specified. Patient primarily struggles with anxiety and racing thoughts. Will hold all psychotropic medications for now secondary to Depakote toxicity. Will recheck Depakote level currently in case there was an error in the lab as well as in the morning. Will also recheck hepatic panel in a.m. When Depakote is cleared from her system will consider Seroquel to help with racing thoughts and bipolar symptoms as well as depression.   2. History of amphetamine and benzodiazepine abuse. Urine drug screen was negative for all substances. Patient has been able to be clean of the benzodiazepines since her last hospitalization in which she was tapered off the Klonopin.   3. DISPOSITION: Patient has a stable living situation. Mental health follow up will be with [facility].   ____________________________ Doralee Albino. Maryruth Bun, MD akk:cms D: 02/09/2012 19:41:37 ET T: 02/10/2012 06:51:53 ET JOB#: [id number]  cc: Catarino Vold K. Maryruth Bun, MD, <Dictator> Darliss Ridgel MD ELECTRONICALLY SIGNED 02/10/2012 17:59

## 2015-03-17 NOTE — Consult Note (Signed)
PATIENT NAME:  [name], [name] MR#:  [id number] DATE OF BIRTH:  [birth year]  DATE OF CONSULTATION:  02/09/2012  REFERRING PHYSICIAN:  Caryn SectionAarti Kapur, MD CONSULTING PHYSICIAN:  Coralyn Roselli H. Allena KatzPatel, MD  PRIMARY CARE PHYSICIAN: Tomi BambergerSusan Fuller, MD  REASON FOR CONSULTATION: Elevated valproic acid level.   HISTORY OF PRESENT ILLNESS: The patient is a 54 year old white female with a previous psychiatric history who has been hospitalized here previously who presented with the complaint of ingesting 14 tablets of trazodone. The patient apparently was cleared medically and sent down to the behavior unit. Her valproic acid level was checked and it was 287, so I am asked to see the patient. Apparently earlier she was a little sleepy, but now the patient is more awake, alert, and oriented. She tells me that she took 14 of the trazodone. She does not remember taking extra Depakote, she just took her usual dose yesterday. She denies any other ingestions. She reports that she just wanted to sleep. That was the reason she took the trazodone. She was a little unsteady on her gait earlier. She did eat dinner and became nauseous and started throwing up when I was there. Otherwise, she reports some headache, but denies any visual difficulties, no blurred vision, denies any nystagmus, denies any chest pain or shortness of breath, no abdominal pain, no urinary symptoms, and no numbness or tingling.   PAST MEDICAL HISTORY: Depression requiring admissions to the psychiatric ward.   ALLERGIES: Sulfa.  HOME MEDICATIONS:  1. Zoloft 100 mg 1-1/2 tabs daily.  2. Trazodone 50 mg daily.  3. Multivitamin 1 tab p.o. daily.  4. Hydroxyzine 25 mg one tab p.o. three times daily. 5. Depakote extended-release 500 mg two tabs daily.   SOCIAL HISTORY: She reports no smoking, no alcohol, and no drug use.   FAMILY HISTORY: Positive for coronary artery disease in the family.   REVIEW OF SYSTEMS: CONSTITUTIONAL: Denies any fevers. Complains of  slight fatigue and weakness. No pain. No weight loss. No weight gain. EYES: No blurred or double vision. No redness. No inflammation. No glaucoma. No cataracts. ENT: No tinnitus. No ear pain. No hearing loss. No allergies, seasonal or year-round. No nasal discharge. No snoring. No postnasal drip. No difficulty swallowing. RESPIRATORY: No cough. No wheezing. No hemoptysis. No dyspnea. No chronic obstructive pulmonary disease. No tuberculosis. CARDIOVASCULAR: No chest pain. No orthopnea. No edema. No palpitations. No syncope. GASTROINTESTINAL: Just nauseous after eating her dinner and threw up. No abdominal pain. No hematemesis. No melena. No gastroesophageal reflux disease. No changes in bowel habits. GU: Denies any dysuria, hematuria, renal calculus, or frequency. ENDOCRINE: Denies any polydipsia, nocturia, or thyroid problems. HEME/LYMPH: Denies anemia, easy bruisability, bleeding, or swollen glands. SKIN: No acne. No rash. No changes in mole, hair, or skin. MUSCULOSKELETAL: No pain in the neck, back, or shoulder. NEURO: No numbness or weakness. No cerebrovascular accident. No transient ischemic attack. PSYCHIATRIC: Depression and anxiety.   PHYSICAL EXAMINATION:   VITAL SIGNS: Temperature 99.4, pulse 89, respirations 20, blood pressure 131/89.   GENERAL: The patient is a thin Caucasian female currently throwing up in my presence, but in no acute distress.   HEENT: Head atraumatic, normocephalic. Pupils equally round and reactive to light and accommodation. Extraocular movements intact. There is no conjunctival pallor. No scleral icterus. Oropharynx is clear without any exudates.   NECK: There is no thyromegaly. No carotid bruits.   CARDIOVASCULAR: Regular rate and rhythm. No murmurs, rubs, clicks, or gallops. PMI is not displaced.   LUNGS:  Clear to auscultation bilaterally without any rales, rhonchi, or wheezing.   ABDOMEN: Soft, nontender, and nondistended. Positive bowel sounds x4.    EXTREMITIES: No clubbing, cyanosis, or edema.   SKIN: No rash.   LYMPHATICS: No lymph nodes palpable.   MUSCULOSKELETAL: There is no swelling or erythema.   VASCULAR: Good DP and PT pulses.   NEUROLOGICAL: Awake, alert, and oriented x3. No focal deficits.   PSYCHIATRIC: Depressed.   LYMPH NODES: No lymph nodes palpable.   LABS/STUDIES: Valproic acid 287. WBC 8.4, hemoglobin 13.3, platelet count 266. Glucose 90, BUN 16, creatinine 0.70, sodium 140, potassium 4.1, chloride 101, CO2 25, calcium 8.4, bilirubin total 0.3, alkaline phosphatase 50, ALT 17, AST 18, total protein 7.3. Alcohol level is less than 0.003. TSH 1.62.   Urinalysis was negative. TUDS were negative. Urine pregnancy was negative.   ASSESSMENT AND PLAN: The patient is a 54 year old white female who is admitted with intentional overdose and noted to have a valproic acid level that is elevated.  1. Overdose on trazodone and possible Depakote. I have called Poison Control. Usually serious toxicity of Depakote is seen at greater than 450 mcg/mL. They recommended just monitoring the patient giving her IV hydration. At this time, no other intervention is needed. The patient is mentally awake, alert, and oriented. We will check a complete metabolic panel level in the a.m., a repeat valproic acid level in the a.m. I would hydrate her if possible. We will also check an EKG and check Tylenol level and salicylate level to make sure she has not taken any of these.  2. Nausea and vomiting, possibly due to medications. We will place her on p.r.n. Phenergan IM if needed, low dose, and we will place her on a PPI due to possible gastritis from ingestion of other medications.        TIME SPENT ON CONSULTATION: 35 minutes was spent in seeing the patient, calling Poison Control, and evaluating her.  ____________________________ Lacie Scotts. Allena Katz, MD shp:slb D: 02/09/2012 20:22:43 ET T: 02/10/2012 09:48:39  ET JOB#: [id number]  cc: Sani Loiseau H. Allena Katz, MD, <Dictator> Tomi Bamberger, MD Charise Carwin MD ELECTRONICALLY SIGNED 02/11/2012 7:00

## 2015-03-17 NOTE — Consult Note (Signed)
  Psychological Assessment  Jacqueline GinsMary Joyce48of Evaluation: 37-13 through 3-12-13Administered: [place] Multiphasic Personality Inventory-2 (MMPI-2) for Referral: [name] was referred for a psychological assessment by her physician, Caryn SectionAarti Kapur, MD.  She was admitted to Behavioral Medicine for treatment of suicidal ideation. Please see the history and physical and psychosocial history for further background information. An assessment of personality structure was requested. [name]?s MMPI-2 protocol is compared to that of other adult females she obtained the following profile: [id number]:#. Initially, [name] turned in her answer sheet with over 100 unanswered questions. She stated that she could not make up her mind. She was encouraged to complete the unanswered questions and she did so answering all items. The MMPI-2 validity scales indicate that the clinical profile is valid. PresentationShe reports that she is experiencing a very mild level of emotional distress characterized by depression and an even milder level of anxiety. She feels unable to get going. She may have a low level of anhedonia.  She reports problems with her attention, concentration and memory. She does not likes to let people know where she stands on things and does not stand up for what she thinks is right or if people do something that makes her angry. She lacks self-confidence and thinks she is useless at times. She makes decisions easily. Her thought processes are very common, with no unusual ideas or experiences. Her judgment is not as good as it was in the past. She usually does not analyze the motives for her own or others? behavior. She wishes that she could be as happy as others seem to be. Relations: She reports a balance between extroverted and introverted behaviors. She is not bashful, unusually self-conscious or easily embarrassed. She does not mind meeting strangers. Her family demonstrated love and companionship  toward her. Problem Areas: She reports that she is in just as good physical health as most of her friends. Although she is less able to work now than she was in the past, she usually has enough energy to do her work. She is very conventional and does not get into trouble over her behavior.  Her prognosis is fair because she is experiencing very little distress, which limits her motivation for any kind of intervention. Short-term, behavioral interventions focused on her reasons for entering treatment will be important initially. If she becomes engaged in the treatment process, cognitive-behavioral therapy focusing on her depressive cognitions. Impression:Depressive DisorderFeatures   Electronic Signatures: Carola FrostRoush, Lee Ann (PsyD, HSP-P)  (Signed on 12-Mar-13 11:08)  Authored  Last Updated: 12-Mar-13 11:08 by Carola Frostoush, Lee Ann (PsyD, HSP-P)

## 2015-05-27 ENCOUNTER — Other Ambulatory Visit: Payer: Self-pay | Admitting: Psychiatry

## 2015-05-28 ENCOUNTER — Encounter: Payer: Self-pay | Admitting: Psychiatry

## 2015-05-28 ENCOUNTER — Other Ambulatory Visit: Payer: Self-pay

## 2015-05-28 ENCOUNTER — Ambulatory Visit (INDEPENDENT_AMBULATORY_CARE_PROVIDER_SITE_OTHER): Payer: Commercial Managed Care - PPO | Admitting: Psychiatry

## 2015-05-28 VITALS — BP 100/76 | HR 82 | Temp 98.0°F | Ht 62.5 in | Wt 130.8 lb

## 2015-05-28 DIAGNOSIS — F3160 Bipolar disorder, current episode mixed, unspecified: Secondary | ICD-10-CM

## 2015-05-28 DIAGNOSIS — G4700 Insomnia, unspecified: Secondary | ICD-10-CM | POA: Insufficient documentation

## 2015-05-28 DIAGNOSIS — F3130 Bipolar disorder, current episode depressed, mild or moderate severity, unspecified: Secondary | ICD-10-CM | POA: Insufficient documentation

## 2015-05-28 DIAGNOSIS — F411 Generalized anxiety disorder: Secondary | ICD-10-CM | POA: Insufficient documentation

## 2015-05-28 MED ORDER — GABAPENTIN 100 MG PO CAPS: 100.0000 mg | ORAL_CAPSULE | Freq: Four times a day (QID) | ORAL | Status: DC

## 2015-05-28 MED ORDER — VENLAFAXINE HCL ER 150 MG PO TB24: 1.0000 | ORAL_TABLET | Freq: Every day | ORAL | Status: DC

## 2015-05-28 MED ORDER — QUETIAPINE FUMARATE 200 MG PO TABS: 200.0000 mg | ORAL_TABLET | Freq: Every day | ORAL | Status: DC

## 2015-05-28 NOTE — Progress Notes (Signed)
BH MD/PA/NP OP Progress Note  05/28/2015 1:49 PM Barbaraann CaoMary K Pilz  MRN:  [id number]  Subjective:   Patient is a 54 year old female who presented for the follow-up appointment. She reported that she is doing well and has been taking her medications. She has occasional anxiety symptoms and has been taking gabapentin for the same. She reported that she wants to have her medications adjusted. She has been stable on her medications for a long time. Patient reported that she does not feel any worsening of her anxiety at this time. Patient currently denied having any suicidal homicidal ideations or plans. She is currently working full-time at [employer]. She sleeps well at night with the help of Seroquel.   Chief Complaint:  Chief Complaint    Follow-up; Anxiety     Visit Diagnosis:  No diagnosis found.  Past Medical History:  Past Medical History  Diagnosis Date  . Depression   . Hypertension   . Anxiety   . Bipolar 1 disorder     Past Surgical History  Procedure Laterality Date  . Wisdom tooth extraction     Family History:  Family History  Problem Relation Age of Onset  . Arthritis Mother   . Hypertension Mother   . Hyperlipidemia Mother   . Heart attack Father   . Bipolar disorder Brother   . Anxiety disorder Brother   . Depression Brother   . Depression Maternal Aunt    Social History:  History   Social History  . Marital Status: Married    Spouse Name: N/A  . Number of Children: N/A  . Years of Education: N/A   Social History Main Topics  . Smoking status: Never Smoker   . Smokeless tobacco: Never Used  . Alcohol Use: No     Comment: social   . Drug Use: No  . Sexual Activity: No   Other Topics Concern  . None   Social History Narrative   Additional History:  Pt stated that she works at [employer] and she is busy. She has been working full time.   Assessment:   Musculoskeletal: Strength & Muscle Tone: within normal limits Gait & Station: normal Patient  leans: N/A  Psychiatric Specialty Exam: HPI  Review of Systems  Constitutional: Negative for weight loss.  HENT: Negative for congestion and ear pain.   Eyes: Negative for photophobia and discharge.  Respiratory: Negative for sputum production.   Cardiovascular: Negative for claudication.  Gastrointestinal: Negative for nausea, diarrhea and blood in stool.  Genitourinary: Negative for urgency and hematuria.  Musculoskeletal: Negative for back pain and neck pain.  Neurological: Negative for tingling and speech change.  Endo/Heme/Allergies: Negative for environmental allergies.  Psychiatric/Behavioral: Positive for depression. The patient is nervous/anxious. The patient does not have insomnia.     Blood pressure 100/76, pulse 82, temperature 98 F (36.7 C), temperature source Tympanic, height 5' 2.5" (1.588 m), weight 130 lb 12.8 oz (59.33 kg), last menstrual period 05/28/2013, SpO2 97 %.Body mass index is 23.53 kg/(m^2).  General Appearance: Casual  Eye Contact:  Fair  Speech:  Clear and Coherent  Volume:  Normal  Mood:  Depressed  Affect:  Congruent  Thought Process:  Coherent  Orientation:  Full (Time, Place, and Person)  Thought Content:  WDL  Suicidal Thoughts:  No    Homicidal Thoughts:  No  Memory:  NA  Judgement:  Fair  Insight:  Fair  Psychomotor Activity:  Normal  Concentration:  Fair  Recall:  [employer]  Fund  of Knowledge: Fair  Language: Fair  Akathisia:  No  Handed:  Right  AIMS (if indicated):  none  Assets:  Communication Skills Desire for Improvement Physical Health Social Support  ADL's:  Intact  Cognition: WNL  Sleep:  6-7   Is the patient at risk to self?  No. Has the patient been a risk to self in the past 6 months?  No. Has the patient been a risk to self within the distant past?  No. Is the patient a risk to others?  No. Has the patient been a risk to others in the past 6 months?  No. Has the patient been a risk to others within the distant past?   No.  Current Medications: Current Outpatient Prescriptions  Medication Sig Dispense Refill  . Biotin 1000 MCG tablet Take by mouth.    . Calcium Lactate 648 MG TABS Take by mouth.    . calcium-vitamin D (OSCAL WITH D) 500-200 MG-UNIT per tablet Take 1 tablet by mouth daily. For Vitamin D replacement and mood control. 30 tablet 0  . Cholecalciferol (VITAMIN D3) 5000 UNITS CAPS Take by mouth.    . Multiple Vitamin (MULTIVITAMIN WITH MINERALS) TABS Take 1 tablet by mouth daily. For nutritional supplementation. 30 tablet 0  . QUEtiapine (SEROQUEL) 200 MG tablet Take 1 tablet (200 mg total) by mouth at bedtime. 30 tablet 0  . Venlafaxine HCl 150 MG TB24 Take 1 tablet (150 mg total) by mouth daily. 30 tablet 0  . gabapentin (NEURONTIN) 100 MG capsule Take by mouth.    Marland Kitchen lisinopril (PRINIVIL,ZESTRIL) 10 MG tablet Take 1 tablet (10 mg total) by mouth daily. For control of high blood pressure (Patient not taking: Reported on 05/28/2015) 30 tablet 0   No current facility-administered medications for this visit.    Medical Decision Making:  Review of Psycho-Social Stressors (1) and Review of Medication Regimen & Side Effects (2)  Treatment Plan Summary:Medication management  Discussed with patient about her medications and she will continue on Seroquel 200 mg daily at bedtime.  She will also continue on venlafaxine 150 mg in the morning.  I will titrate the dose of gabapentin 100 mg 4 times a day and advised patient to adjust the dose according to her needs and she demonstrated understanding.  Follow up in 3 months or earlier.   More than 50% of the time spent in psychoeducation, counseling and coordination of care.    This note was generated in part or whole with voice recognition software. Voice regonition is usually quite accurate but there are transcription errors that can and very often do occur. I apologize for any typographical errors that were not detected and corrected.    Brandy Hale 05/28/2015, 1:49 PM

## 2015-06-01 ENCOUNTER — Other Ambulatory Visit: Payer: Self-pay | Admitting: Psychiatry

## 2015-06-03 ENCOUNTER — Other Ambulatory Visit: Payer: Self-pay

## 2015-06-03 DIAGNOSIS — F411 Generalized anxiety disorder: Secondary | ICD-10-CM

## 2015-06-03 NOTE — Telephone Encounter (Signed)
received a request for refill on buspirone  hcl 5mg  pt last seen on 05-28-15 and next appt 08-27-15

## 2015-06-04 MED ORDER — BUSPIRONE HCL 5 MG PO TABS: ORAL_TABLET | ORAL | Status: DC

## 2015-07-28 ENCOUNTER — Other Ambulatory Visit: Payer: Self-pay | Admitting: Psychiatry

## 2015-08-27 ENCOUNTER — Encounter: Payer: Self-pay | Admitting: Psychiatry

## 2015-08-27 ENCOUNTER — Ambulatory Visit (INDEPENDENT_AMBULATORY_CARE_PROVIDER_SITE_OTHER): Payer: Commercial Managed Care - PPO | Admitting: Psychiatry

## 2015-08-27 VITALS — BP 118/78 | Temp 97.6°F | Ht 62.0 in | Wt 132.4 lb

## 2015-08-27 DIAGNOSIS — F411 Generalized anxiety disorder: Secondary | ICD-10-CM | POA: Diagnosis not present

## 2015-08-27 DIAGNOSIS — F331 Major depressive disorder, recurrent, moderate: Secondary | ICD-10-CM

## 2015-08-27 MED ORDER — GABAPENTIN 100 MG PO CAPS: 100.0000 mg | ORAL_CAPSULE | Freq: Two times a day (BID) | ORAL | Status: DC

## 2015-08-27 MED ORDER — BUSPIRONE HCL 5 MG PO TABS: 5.0000 mg | ORAL_TABLET | Freq: Two times a day (BID) | ORAL | Status: DC

## 2015-08-27 MED ORDER — QUETIAPINE FUMARATE 200 MG PO TABS: 200.0000 mg | ORAL_TABLET | Freq: Every day | ORAL | Status: DC

## 2015-08-27 MED ORDER — VENLAFAXINE HCL ER 150 MG PO TB24: 1.0000 | ORAL_TABLET | Freq: Every day | ORAL | Status: DC

## 2015-08-27 NOTE — Progress Notes (Signed)
BH MD/PA/NP OP Progress Note  08/27/2015 1:40 PM [name]  MRN:  [id number]  Subjective:   Patient is a 54 year old female who presented for the follow-up appointment. She reported that she is feeling anxious and she did not start taking the buspirone as prescribed. She took only 1 or 2 pills and then stop the medications. Patient reported that she was prescribed Klonopin for a long time by Dr. Evelene Croon in the past. She reported that she is having relationship issues with her mother as she has started seeing her old boyfriend again and her mother is upset about the same issue. 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 She pulled 90 hours in the past 2 weeks. Patient appeared calm and cooperative during the interview. She currently denied having any depression at this time. She reported that she sleeps well with the help of the Seroquel and it has helped with her mood stabilization. She denied having any suicidal homicidal ideations or plans.   Chief Complaint:  Chief Complaint    Follow-up; Medication Refill; Anxiety     Visit Diagnosis:     ICD-9-CM ICD-10-CM   1. Bipolar 1 disorder, mixed, moderate (HCC) 296.62 F31.62     Past Medical History:  Past Medical History  Diagnosis Date  . Depression   . Hypertension   . Anxiety   . Bipolar 1 disorder [facility])     Past Surgical History  Procedure Laterality Date  . Wisdom tooth extraction     Family History:  Family History  Problem Relation Age of Onset  . Arthritis Mother   . Hypertension Mother   . Hyperlipidemia Mother   . Heart attack Father   . Bipolar disorder Brother   . Anxiety disorder Brother   . Depression Brother   . Depression Maternal Aunt    Social History:  Social History   Social History  . Marital Status: Married    Spouse Name: N/A  . Number of  Children: N/A  . Years of Education: N/A   Social History Main Topics  . Smoking status: Never Smoker   . Smokeless tobacco: Never Used  . Alcohol Use: No     Comment: social   . Drug Use: No  . Sexual Activity: No   Other Topics Concern  . None   Social History Narrative   Additional History:  Pt stated that she works at [employer] and she is busy. She has been working full time.   Assessment:   Musculoskeletal: Strength & Muscle Tone: within normal limits Gait & Station: normal Patient leans: N/A  Psychiatric Specialty Exam: Anxiety Symptoms include nervous/anxious behavior. Patient reports no insomnia or nausea.      Review of Systems  Constitutional: Negative for weight loss.  HENT: Negative for congestion and ear pain.   Eyes: Negative for photophobia and discharge.  Respiratory: Negative for sputum production.   Cardiovascular: Negative for claudication.  Gastrointestinal: Negative for nausea, diarrhea and blood in stool.  Genitourinary: Negative for urgency and hematuria.  Musculoskeletal: Negative for back pain and neck pain.  Neurological: Negative for tingling and speech change.  Endo/Heme/Allergies: Negative for environmental allergies.  Psychiatric/Behavioral: Positive for depression. The patient is nervous/anxious. The patient does not have insomnia.     Blood pressure 118/78, temperature 97.6 F (36.4 C), temperature source  Tympanic, height  (1.575 m), weight 132 lb 6.4 oz (60.056 kg), last menstrual period 05/28/2013.Body mass index is 24.21 kg/(m^2).  General Appearance: Casual  Eye Contact:  Fair  Speech:  Clear and Coherent  Volume:  Normal  Mood:  Depressed  Affect:  Congruent  Thought Process:  Coherent  Orientation:  Full (Time, Place, and Person)  Thought Content:  WDL  Suicidal Thoughts:  No    Homicidal Thoughts:  No  Memory:  NA  Judgement:  Fair  Insight:  Fair  Psychomotor Activity:  Normal  Concentration:  Fair  Recall:   [employer] of Knowledge: Fair  Language: Fair  Akathisia:  No  Handed:  Right  AIMS (if indicated):  none  Assets:  Communication Skills Desire for Improvement Physical Health Social Support  ADL's:  Intact  Cognition: WNL  Sleep:  6-7   Is the patient at risk to self?  No. Has the patient been a risk to self in the past 6 months?  No. Has the patient been a risk to self within the distant past?  No. Is the patient a risk to others?  No. Has the patient been a risk to others in the past 6 months?  No. Has the patient been a risk to others within the distant past?  No.  Current Medications: Current Outpatient Prescriptions  Medication Sig Dispense Refill  . Biotin 1000 MCG tablet Take by mouth.    . Calcium Lactate 648 MG TABS Take by mouth.    . calcium-vitamin D (OSCAL WITH D) 500-200 MG-UNIT per tablet Take 1 tablet by mouth daily. For Vitamin D replacement and mood control. 30 tablet 0  . Cholecalciferol (VITAMIN D3) 5000 UNITS CAPS Take by mouth.    . gabapentin (NEURONTIN) 100 MG capsule TAKE 1 CAPSULES BY MOUTH TWICE A DAY 60 capsule 1  . Multiple Vitamin (MULTIVITAMIN WITH MINERALS) TABS Take 1 tablet by mouth daily. For nutritional supplementation. 30 tablet 0  . QUEtiapine (SEROQUEL) 200 MG tablet Take 1 tablet (200 mg total) by mouth at bedtime. 90 tablet 0  . Venlafaxine HCl 150 MG TB24 Take 1 tablet (150 mg total) by mouth daily. 90 tablet 0  . Venlafaxine HCl 150 MG TB24 TAKE 1 TABLET (150 MG TOTAL) BY MOUTH DAILY. 30 tablet 0   No current facility-administered medications for this visit.    Medical Decision Making:  Review of Psycho-Social Stressors (1) and Review of Medication Regimen & Side Effects (2)  Treatment Plan Summary:Medication management   Depression  She will also continue on venlafaxine 150 mg in the morning. Continue gabapentin 100 mg 2 times a day   Mood symptoms Continue Seroquel 200 mg at bedtime  Anxiety Restarted her on buspirone 5  mg by mouth twice a day and advised her to continue taking the medication and she demonstrated understanding  Follow up in 2 months or earlier.   More than 50% of the time spent in psychoeducation, counseling and coordination of care.  Time spent with the patient 25 minutes   This note was generated in part or whole with voice recognition software. Voice regonition is usually quite accurate but there are transcription errors that can and very often do occur. I apologize for any typographical errors that were not detected and corrected.    Akeylah Hendel 08/27/2015, 1:40 PM

## 2015-08-29 ENCOUNTER — Other Ambulatory Visit: Payer: Self-pay | Admitting: Obstetrics & Gynecology

## 2015-08-29 DIAGNOSIS — R928 Other abnormal and inconclusive findings on diagnostic imaging of breast: Secondary | ICD-10-CM

## 2015-09-03 ENCOUNTER — Ambulatory Visit (INDEPENDENT_AMBULATORY_CARE_PROVIDER_SITE_OTHER): Payer: 59 | Admitting: Licensed Clinical Social Worker

## 2015-09-03 DIAGNOSIS — F411 Generalized anxiety disorder: Secondary | ICD-10-CM | POA: Diagnosis not present

## 2015-09-03 DIAGNOSIS — F319 Bipolar disorder, unspecified: Secondary | ICD-10-CM | POA: Diagnosis not present

## 2015-09-03 NOTE — Progress Notes (Signed)
Patient:   [name]   DOB:   06-Aug-1961  MR Number:  [id number]  Location:  [facility] [address] [place] [address] Dept: [phone]           Date of Service:   09/03/2015  Start Time:   1p End Time:   2p   Provider/Observer:  Marinda Elk Counselor       Billing Code/Service: [phone]  Behavioral Observation: [name]  presents as a 54 y.o.-year-old Caucasian Female who appeared her stated age. her dress was Appropriate and she was Neat and her manners were Appropriate to the situation.  There were not any physical disabilities noted.  she displayed an appropriate level of cooperation and motivation.    Interactions:    Active   Attention:   within normal limits  Memory:   within normal limits  Speech (Volume):  normal  Speech:   normal volume  Thought Process:  Coherent  Though Content:  WNL  Orientation:   person, place, time/date and situation  Judgment:   Fair  Planning:   Fair  Affect:    Resistant  Mood:    Depressed  Insight:   Fair  Intelligence:   normal  Chief Complaint:     Chief Complaint  Patient presents with  . Anxiety  . Establish Care    Reason for Service:  "Overcoming my past and looking for a brighter future. 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[employer]:   [name]; attended [employer]; has a Advertising copywriter in Geographical information systems officer History:  Denies  Research officer, trade union:  Denies   Religious/Spiritual Preferences:  [employer]  Family/Childhood History:                           Born in [place], father passed away when she was 12; has 2 brothers and a sister;  describes childhood as "good, my mom worked a lot, tried to help with house and younger brother"   Children/Grand-children:    [name] 24, [name] 26/0 Grandchildren  Natural/Informal Support:                           Mother, everyone if I am having a good day   Substance Use:  There is a documented history of alcohol abuse confirmed by the patient.  Enjoys drinking wine or mixed cocktails; occasionally less than once monthly; began drinking at 8th grade; "drinks for social anxiety"   Medical History:   Past Medical History  Diagnosis Date  . Depression   . Hypertension   . Anxiety   . Bipolar 1 disorder (HCC)           Medication List       This list is accurate as of: 09/03/15  1:06 PM.  Always use your most recent med list.               Biotin 1000 MCG tablet  Take by mouth.     busPIRone 5 MG tablet  Commonly known as:  BUSPAR  Take  1 tablet (5 mg total) by mouth 2 (two) times daily.     Calcium Lactate 648 MG Tabs  Take by mouth.     calcium-vitamin D 500-200 MG-UNIT tablet  Commonly known as:  OSCAL WITH D  Take 1 tablet by mouth daily. For Vitamin D replacement and mood control.     gabapentin 100 MG capsule  Commonly known as:  NEURONTIN  Take 1 capsule (100 mg total) by mouth 2 (two) times daily.     multivitamin with minerals Tabs tablet  Take 1 tablet by mouth daily. For nutritional supplementation.     QUEtiapine 200 MG tablet  Commonly known as:  SEROQUEL  Take 1 tablet (200 mg total) by mouth at bedtime.     Venlafaxine HCl 150 MG Tb24  TAKE 1 TABLET (150 MG TOTAL) BY MOUTH DAILY.     Venlafaxine HCl 150 MG Tb24  Take 1 tablet (150 mg total) by mouth daily.     Vitamin D3 5000 UNITS Caps  Take by mouth.              Sexual History:   History  Sexual Activity  . Sexual Activity: No     Abuse/Trauma History: Age 54 hospitalized after smoking Marijuana that was "laced" had a hard time getting back to the swing of  things   Psychiatric History:  While married husband was an alcoholic   Strengths:   Loving, caring, kind, considerate,    Recovery Goals:  "Overcoming my past and looking for a brighter future. I don't want to be hospitalized anymore"  Hobbies/Interests:              Horseback riding, get out and driving, watching her nephew play sports,    Challenges/Barriers: "My mother is the challenge, scared to do things"    Family Med/Psych History:  Family History  Problem Relation Age of Onset  . Arthritis Mother   . Hypertension Mother   . Hyperlipidemia Mother   . Heart attack Father   . Bipolar disorder Brother   . Anxiety disorder Brother   . Depression Brother   . Depression Maternal Aunt     Risk of Suicide/Violence: moderate   History of Suicide/Violence:  Overdose several attempts  Psychosis:   Denies   Diagnosis:    Generalized Anxiety Disorder  Impression/DX:  [name] is diagnosed with Generalized Anxiety Disorder due to her current symptoms such as: Anxiety, withdraw, 4 days of anxiety per week, hindering work, overwhelming.  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  [name] will be best supported by medication management and outpatient therapy to assist with coping skills and understanding her triggers.  Tanisia does not have current SI or HI.  She has a history SI and hospitalizations. She has a few protective factors.  [name] has a few positive relationships with others and denies psychosis.   Recommendation/Plan: Writer recommends Outpatient Therapy at least twice monthly to include but not limited to individual, group and or family therapy.  Medication Management is also recommended to assist with her mood.

## 2015-09-09 ENCOUNTER — Ambulatory Visit
Admission: RE | Admit: 2015-09-09 | Discharge: 2015-09-09 | Disposition: A | Discharge status: Home / Self Care | Payer: Commercial Managed Care - PPO | Source: Ambulatory Visit | Attending: Obstetrics & Gynecology | Admitting: Obstetrics & Gynecology

## 2015-09-09 DIAGNOSIS — R928 Other abnormal and inconclusive findings on diagnostic imaging of breast: Secondary | ICD-10-CM

## 2015-09-16 ENCOUNTER — Ambulatory Visit (INDEPENDENT_AMBULATORY_CARE_PROVIDER_SITE_OTHER): Payer: 59 | Admitting: Licensed Clinical Social Worker

## 2015-09-16 DIAGNOSIS — F319 Bipolar disorder, unspecified: Secondary | ICD-10-CM | POA: Diagnosis not present

## 2015-09-17 NOTE — Progress Notes (Signed)
   THERAPIST PROGRESS NOTE  Session Time: 60min  Participation Level: Active  Behavioral Response: CasualAlertDepressed  Type of Therapy: Individual Therapy  Treatment Goals addressed: Coping  Interventions: CBT and Motivational Interviewing  Summary: [name] is a 54 y.o. female who presents with continued symptoms of her diagnosis.  Her focus was on how she can get her current boyfriend whom she recently got back together with to have positive interactions with her mother.  She reports that she goes to his home about 5 days per week when she gets off work at 11pm and stays until about 2am.  She reports that she is learning to cope with her stressors at work and in the home.  She is unsure what led to her attempted overdose a few years ago.   Suicidal/Homicidal: Nowithout intent/plan  Therapist Response: Assessed mood. Actively listened as she listed her stressors.  Discussed coping skills and how to manage stress.  Role played effective communication and positive interactions.  Plan: Return again in 2 weeks.  Diagnosis: Axis I: Bipolar    Axis II: No diagnosis    Marinda Elkicole M Peacock 09/17/2015

## 2015-09-26 ENCOUNTER — Other Ambulatory Visit: Payer: Self-pay | Admitting: Psychiatry

## 2015-09-30 ENCOUNTER — Ambulatory Visit (INDEPENDENT_AMBULATORY_CARE_PROVIDER_SITE_OTHER): Payer: 59 | Admitting: Licensed Clinical Social Worker

## 2015-09-30 DIAGNOSIS — F319 Bipolar disorder, unspecified: Secondary | ICD-10-CM | POA: Diagnosis not present

## 2015-10-03 NOTE — Progress Notes (Signed)
   THERAPIST PROGRESS NOTE  Session Time: 60min  Participation Level: Active  Behavioral Response: CasualAlertDepressed  Type of Therapy: Individual Therapy  Treatment Goals addressed: Coping  Interventions: CBT, Motivational Interviewing, Solution Focused, Strength-based, Assertiveness Training, Supportive, Family Systems and Reframing  Summary: Barbaraann CaoMary K Coble is a 54 y.o. female who presents with continued symptoms of her diagnosis.  She explored relationships with her current boyfriend, mother and sister.  She reports that she is self aware of what caused her to overdose in the past and wants to avoid those mistakes in the future.  She reports lack of sleep due to visiting her boyfriend every night; getting home around 2am.  She reports that living with her mother is stressful due to her mother's demands of house cleaning and yard work.  She reports that her mother wants her to be up out of the bed by 930 daily.     Suicidal/Homicidal: Nowithout intent/plan  Therapist Response: LCSW provided Patient with ongoing emotional support and encouragement.  Normalized her feelings.  Commended Patient on her progress and reinforced the importance of client staying focused on her own strengths and resources and resiliency. Processed various strategies for dealing with stressors.    Plan: Return again in 2 weeks.  Diagnosis: Axis I: Bipolar    Axis II: No diagnosis    Marinda Elkicole M Allen Egerton 10/03/2015

## 2015-10-14 ENCOUNTER — Ambulatory Visit (INDEPENDENT_AMBULATORY_CARE_PROVIDER_SITE_OTHER): Payer: 59 | Admitting: Licensed Clinical Social Worker

## 2015-10-14 DIAGNOSIS — F319 Bipolar disorder, unspecified: Secondary | ICD-10-CM

## 2015-10-22 NOTE — Progress Notes (Signed)
   THERAPIST PROGRESS NOTE  Session Time: 60min  Participation Level: Active  Behavioral Response: NeatAlertDepressed  Type of Therapy: Individual Therapy  Treatment Goals addressed: Coping  Interventions: CBT, Motivational Interviewing, Solution Focused, Strength-based, Supportive, Family Systems and Reframing  Summary: [name] is a 54 y.o. female who presents with continued stressors.  She continues to have difficulty with coping with her previous relationship which has resurfaced and is a major component in her life. She attributes her previous overdose to lack of rest and being stressed.  She continues to pressure herself with the relationship and assisting her mother with daily activities.  She is unable to problem solve and make decisions for herself.   Suicidal/Homicidal: Nowithout intent/plan  Therapist Response: LCSW provided Patient with ongoing emotional support and encouragement.  Normalized her feelings.  Commended Patient on her progress and reinforced the importance of client staying focused on her own strengths and resources and resiliency. Processed various strategies for dealing with stressors.    Plan: Return again in 2 weeks.  Diagnosis: Axis I: bipolar, unspecified    Axis II: No diagnosis    Marinda Elkicole M Peacock 10/22/2015

## 2015-10-24 ENCOUNTER — Ambulatory Visit (INDEPENDENT_AMBULATORY_CARE_PROVIDER_SITE_OTHER): Payer: 59 | Admitting: Psychiatry

## 2015-10-24 ENCOUNTER — Encounter: Payer: Self-pay | Admitting: Psychiatry

## 2015-10-24 VITALS — BP 124/88 | HR 72 | Temp 97.3°F | Ht 62.0 in | Wt 133.4 lb

## 2015-10-24 DIAGNOSIS — F331 Major depressive disorder, recurrent, moderate: Secondary | ICD-10-CM | POA: Diagnosis not present

## 2015-10-24 MED ORDER — QUETIAPINE FUMARATE 200 MG PO TABS: 200.0000 mg | ORAL_TABLET | Freq: Every day | ORAL | Status: DC

## 2015-10-24 MED ORDER — BUSPIRONE HCL 5 MG PO TABS: 5.0000 mg | ORAL_TABLET | Freq: Two times a day (BID) | ORAL | Status: DC

## 2015-10-24 MED ORDER — GABAPENTIN 100 MG PO CAPS: 100.0000 mg | ORAL_CAPSULE | Freq: Every day | ORAL | Status: DC

## 2015-10-24 MED ORDER — VENLAFAXINE HCL ER 150 MG PO TB24: 150.0000 mg | ORAL_TABLET | Freq: Every morning | ORAL | Status: DC

## 2015-10-24 NOTE — Progress Notes (Signed)
BH MD/PA/NP OP Progress Note  10/24/2015 1:38 PM Jacqueline CaoMary K Haynes  MRN:  [id number]  Subjective:   Patient is a 54 year old female who presented for the follow-up appointment. She reported that she enjoyed the holidays with her family. She reported that she is concerned after the death of her boyfriends ask fianc's mother. She is feeling sad about the same. She is working full-time and is going to work after this appointment. Patient reported that she has been compliant with her medications. She takes venlafaxine in the morning and Seroquel and gabapentin at night. She has not noticed any improvement after taking the gabapentin and she stated that she wants to discontinue the gabapentin. Patient reported that she sleeps well at night. She feels that she is not feeling motivated and wants to try some things at home. She exercises regularly. She denied having any suicidal homicidal ideations or plans. She denied having any anger anxiety or paranoia.   Chief Complaint:  Chief Complaint    Follow-up; Medication Refill     Visit Diagnosis:     ICD-9-CM ICD-10-CM   1. MDD (major depressive disorder), recurrent episode, moderate (HCC) 296.32 F33.1     Past Medical History:  Past Medical History  Diagnosis Date  . Depression   . Hypertension   . Anxiety   . Bipolar 1 disorder [facility](HCC)     Past Surgical History  Procedure Laterality Date  . Wisdom tooth extraction     Family History:  Family History  Problem Relation Age of Onset  . Arthritis Mother   . Hypertension Mother   . Hyperlipidemia Mother   . Heart attack Father   . Bipolar disorder Brother   . Anxiety disorder Brother   . Depression Brother   . Depression Maternal Aunt    Social History:  Social History   Social History  . Marital Status: Married    Spouse Name: N/A  . Number of Children: N/A  . Years of Education: N/A   Social History Main Topics  . Smoking status: Never Smoker   . Smokeless tobacco: Never Used  .  Alcohol Use: No     Comment: social   . Drug Use: No  . Sexual Activity: No   Other Topics Concern  . None   Social History Narrative   Additional History:  Pt stated that she works at [employer] and she is busy. She has been working full time.   Assessment:   Musculoskeletal: Strength & Muscle Tone: within normal limits Gait & Station: normal Patient leans: N/A  Psychiatric Specialty Exam: Anxiety Symptoms include nervous/anxious behavior. Patient reports no insomnia or nausea.      Review of Systems  Constitutional: Negative for weight loss.  HENT: Negative for congestion and ear pain.   Eyes: Negative for photophobia and discharge.  Respiratory: Negative for sputum production.   Cardiovascular: Negative for claudication.  Gastrointestinal: Negative for nausea, diarrhea and blood in stool.  Genitourinary: Negative for urgency and hematuria.  Musculoskeletal: Negative for back pain and neck pain.  Neurological: Negative for tingling and speech change.  Endo/Heme/Allergies: Negative for environmental allergies.  Psychiatric/Behavioral: Positive for depression. The patient is nervous/anxious. The patient does not have insomnia.     Blood pressure 124/88, pulse 72, temperature 97.3 F (36.3 C), temperature source Tympanic, height 5\' 2"  (1.575 m), weight 133 lb 6.4 oz (60.51 kg), last menstrual period 05/28/2013, SpO2 99 %.Body mass index is 24.39 kg/(m^2).  General Appearance: Casual  Eye Contact:  Fair  Speech:  Clear and Coherent  Volume:  Normal  Mood:  Depressed  Affect:  Congruent  Thought Process:  Coherent  Orientation:  Full (Time, Place, and Person)  Thought Content:  WDL  Suicidal Thoughts:  No    Homicidal Thoughts:  No  Memory:  NA  Judgement:  Fair  Insight:  Fair  Psychomotor Activity:  Normal  Concentration:  Fair  Recall:  [employer] of Knowledge: Fair  Language: Fair  Akathisia:  No  Handed:  Right  AIMS (if indicated):  none  Assets:   Communication Skills Desire for Improvement Physical Health Social Support  ADL's:  Intact  Cognition: WNL  Sleep:  6-7   Is the patient at risk to self?  No. Has the patient been a risk to self in the past 6 months?  No. Has the patient been a risk to self within the distant past?  No. Is the patient a risk to others?  No. Has the patient been a risk to others in the past 6 months?  No. Has the patient been a risk to others within the distant past?  No.  Current Medications: Current Outpatient Prescriptions  Medication Sig Dispense Refill  . Biotin 1000 MCG tablet Take by mouth.    . busPIRone (BUSPAR) 5 MG tablet Take 1 tablet (5 mg total) by mouth 2 (two) times daily. 60 tablet 1  . Calcium Lactate 648 MG TABS Take by mouth.    . calcium-vitamin D (OSCAL WITH D) 500-200 MG-UNIT per tablet Take 1 tablet by mouth daily. For Vitamin D replacement and mood control. 30 tablet 0  . Cholecalciferol (VITAMIN D3) 5000 UNITS CAPS Take by mouth.    . gabapentin (NEURONTIN) 100 MG capsule Take 1 capsule (100 mg total) by mouth 2 (two) times daily. 60 capsule 3  . Multiple Vitamin (MULTIVITAMIN WITH MINERALS) TABS Take 1 tablet by mouth daily. For nutritional supplementation. 30 tablet 0  . QUEtiapine (SEROQUEL) 200 MG tablet Take 1 tablet (200 mg total) by mouth at bedtime. 90 tablet 1  . Venlafaxine HCl 150 MG TB24 TAKE 1 TABLET (150 MG TOTAL) BY MOUTH DAILY. 90 tablet 0   No current facility-administered medications for this visit.    Medical Decision Making:  Review of Psycho-Social Stressors (1) and Review of Medication Regimen & Side Effects (2)  Treatment Plan Summary:Medication management   Depression  She will also continue on venlafaxine 150 mg in the morning. I will discontinue the gabapentin but will prescribe medications and advised patient not to take the medication on a regular basis and she demonstrated understanding.  Mood symptoms Continue Seroquel 200 mg at  bedtime  Anxiety Continue buspirone 5 mg by mouth twice a day   Follow up in 2 months or earlier.   More than 50% of the time spent in psychoeducation, counseling and coordination of care.  Time spent with the patient 25 minutes   This note was generated in part or whole with voice recognition software. Voice regonition is usually quite accurate but there are transcription errors that can and very often do occur. I apologize for any typographical errors that were not detected and corrected.    Brandy Hale 10/24/2015, 1:38 PM

## 2015-10-24 NOTE — Progress Notes (Signed)
These were dulicates, medications were refill on  10-24-15

## 2015-12-04 ENCOUNTER — Ambulatory Visit: Payer: Commercial Managed Care - PPO | Admitting: Licensed Clinical Social Worker

## 2015-12-11 ENCOUNTER — Ambulatory Visit (INDEPENDENT_AMBULATORY_CARE_PROVIDER_SITE_OTHER): Payer: 59 | Admitting: Licensed Clinical Social Worker

## 2015-12-11 DIAGNOSIS — F319 Bipolar disorder, unspecified: Secondary | ICD-10-CM

## 2015-12-18 NOTE — Progress Notes (Signed)
    THERAPIST PROGRESS NOTE  Session Time:  Participation Level: Active  Behavioral Response: CasualAlertDepressed  Type of Therapy: Individual Therapy  Treatment Goals addressed: Coping  Interventions: CBT, Motivational Interviewing, Solution Focused, Strength-based, Family Systems and Reframing  Summary: [name] is a 55 y.o. female who presents with continued symptoms of her diagnosis.  She reports that she is working less and spending more time with her boyfriend.  She states that her mother is not focused on her as much since her sister and niece moved in the home.  She states that she continues to take her medication as directed.  Explored self care and focusing less on her romantic relationship and more on herself to avoid.   Suicidal/Homicidal: Nowithout intent/plan  Therapist Response: LCSW provided Patient with ongoing emotional support and encouragement.  Normalized her feelings.  Commended Patient on her progress and reinforced the importance of client staying focused on her own strengths and resources and resiliency. Processed various strategies for dealing with stressors.    Plan: Return again in 2 weeks.  Diagnosis: Axis I: Bipolar, mixed    Axis II: No diagnosis    Marinda Elk 12/13/2015

## 2015-12-30 ENCOUNTER — Ambulatory Visit (INDEPENDENT_AMBULATORY_CARE_PROVIDER_SITE_OTHER): Payer: 59 | Admitting: Psychiatry

## 2015-12-30 ENCOUNTER — Encounter: Payer: Self-pay | Admitting: Psychiatry

## 2015-12-30 VITALS — BP 118/78 | HR 85 | Temp 97.2°F | Ht 62.0 in | Wt 135.0 lb

## 2015-12-30 DIAGNOSIS — F330 Major depressive disorder, recurrent, mild: Secondary | ICD-10-CM

## 2015-12-30 NOTE — Progress Notes (Signed)
BH MD/PA/NP OP Progress Note  12/30/2015 2:12 PM [name]  MRN:  [id number]  Subjective:   Patient is a 55 year old female who presented for the follow-up appointment. She reported that she has decrease the dose of Seroquel and is only taking 200 mg at bedtime. However she was discussing in detail about the medications and reported that she wants to decrease further the dose of Seroquel. She reported that she is having a difficult time sleeping at night. She sleeps very late at night as she comes back from work around 11 PM and then will spend some time with her new boyfriend. She reported that she feels alert at night. She reported that she wants to decrease the dose of Seroquel further. She does not have any acute symptoms at this time. She currently denied having any mood swings anger anxiety or paranoia. She is still taking the gabapentin on a regular basis. She reported that she has some anxiety during the day and is willing to start the BuSpar which was prescribed at the previous appointment. She denied having any adverse effects of medications. She denied having any suicidal homicidal ideations. She appeared calm and collected during the interview.    Chief Complaint:  Chief Complaint    Follow-up; Medication Refill; Anxiety     Visit Diagnosis:     ICD-9-CM ICD-10-CM   1. MDD (major depressive disorder), recurrent episode, mild (HCC) 296.31 F33.0     Past Medical History:  Past Medical History  Diagnosis Date  . Depression   . Hypertension   . Anxiety   . Bipolar 1 disorder [facility])     Past Surgical History  Procedure Laterality Date  . Wisdom tooth extraction     Family History:  Family History  Problem Relation Age of Onset  . Arthritis Mother   . Hypertension Mother   . Hyperlipidemia Mother   . Heart attack Father   . Bipolar disorder Brother   . Anxiety disorder Brother   . Depression Brother   . Depression Maternal Aunt    Social History:  Social History    Social History  . Marital Status: Married    Spouse Name: N/A  . Number of Children: N/A  . Years of Education: N/A   Social History Main Topics  . Smoking status: Never Smoker   . Smokeless tobacco: Never Used  . Alcohol Use: No     Comment: social   . Drug Use: No  . Sexual Activity: No   Other Topics Concern  . None   Social History Narrative   Additional History:  Pt stated that she works at [employer] and she is busy. She has been working full time.   Assessment:   Musculoskeletal: Strength & Muscle Tone: within normal limits Gait & Station: normal Patient leans: N/A  Psychiatric Specialty Exam: Anxiety Symptoms include nervous/anxious behavior. Patient reports no insomnia or nausea.      Review of Systems  Constitutional: Negative for weight loss.  HENT: Negative for congestion and ear pain.   Eyes: Negative for photophobia and discharge.  Respiratory: Negative for sputum production.   Cardiovascular: Negative for claudication.  Gastrointestinal: Negative for nausea, diarrhea and blood in stool.  Genitourinary: Negative for urgency and hematuria.  Musculoskeletal: Negative for back pain and neck pain.  Neurological: Negative for tingling and speech change.  Endo/Heme/Allergies: Negative for environmental allergies.  Psychiatric/Behavioral: Positive for depression. The patient is nervous/anxious. The patient does not have insomnia.  Blood pressure 118/78, pulse 85, temperature 97.2 F (36.2 C), temperature source Tympanic, height  (1.575 m), weight 135 lb (61.236 kg), last menstrual period 05/28/2013, SpO2 97 %.Body mass index is 24.69 kg/(m^2).  General Appearance: Casual  Eye Contact:  Fair  Speech:  Clear and Coherent  Volume:  Normal  Mood:  Depressed  Affect:  Congruent  Thought Process:  Coherent  Orientation:  Full (Time, Place, and Person)  Thought Content:  WDL  Suicidal Thoughts:  No    Homicidal Thoughts:  No  Memory:  NA   Judgement:  Fair  Insight:  Fair  Psychomotor Activity:  Normal  Concentration:  Fair  Recall:  [employer] of Knowledge: Fair  Language: Fair  Akathisia:  No  Handed:  Right  AIMS (if indicated):  none  Assets:  Communication Skills Desire for Improvement Physical Health Social Support  ADL's:  Intact  Cognition: WNL  Sleep:  6-7   Is the patient at risk to self?  No. Has the patient been a risk to self in the past 6 months?  No. Has the patient been a risk to self within the distant past?  No. Is the patient a risk to others?  No. Has the patient been a risk to others in the past 6 months?  No. Has the patient been a risk to others within the distant past?  No.  Current Medications: Current Outpatient Prescriptions  Medication Sig Dispense Refill  . Biotin 1000 MCG tablet Take by mouth.    . busPIRone (BUSPAR) 5 MG tablet Take 1 tablet (5 mg total) by mouth 2 (two) times daily. 180 tablet 1  . Calcium Lactate 648 MG TABS Take by mouth.    . calcium-vitamin D (OSCAL WITH D) 500-200 MG-UNIT per tablet Take 1 tablet by mouth daily. For Vitamin D replacement and mood control. 30 tablet 0  . Cholecalciferol (VITAMIN D3) 5000 UNITS CAPS Take by mouth.    . gabapentin (NEURONTIN) 100 MG capsule Take 1 capsule (100 mg total) by mouth at bedtime. 90 capsule 3  . Multiple Vitamin (MULTIVITAMIN WITH MINERALS) TABS Take 1 tablet by mouth daily. For nutritional supplementation. 30 tablet 0  . QUEtiapine (SEROQUEL) 200 MG tablet Take 1 tablet (200 mg total) by mouth at bedtime. 90 tablet 1  . Venlafaxine HCl 150 MG TB24 1 tablet (150 mg total) by Does not apply route every morning. 90 tablet 3   No current facility-administered medications for this visit.    Medical Decision Making:  Review of Psycho-Social Stressors (1) and Review of Medication Regimen & Side Effects (2)  Treatment Plan Summary:Medication management   Depression  She will also continue on venlafaxine 150 mg in  the morning. Advised patient to stop taking the gabapentin and she demonstrated understanding.  Mood symptoms She will  decrease the Seroquel 100 mg at bedtime- pt has supply.   Anxiety Continue buspirone 5 mg by mouth twice a day   Follow up in 2 months or earlier.   More than 50% of the time spent in psychoeducation, counseling and coordination of care.  Time spent with the patient 25 minutes   This note was generated in part or whole with voice recognition software. Voice regonition is usually quite accurate but there are transcription errors that can and very often do occur. I apologize for any typographical errors that were not detected and corrected.   Brandy Hale, MD    12/30/2015, 2:12 PM

## 2016-01-01 ENCOUNTER — Ambulatory Visit (INDEPENDENT_AMBULATORY_CARE_PROVIDER_SITE_OTHER): Payer: 59 | Admitting: Licensed Clinical Social Worker

## 2016-01-01 DIAGNOSIS — F319 Bipolar disorder, unspecified: Secondary | ICD-10-CM

## 2016-01-13 NOTE — Progress Notes (Signed)
   THERAPIST PROGRESS NOTE  Session Time:  Participation Level: Active  Behavioral Response: CasualAlertAnxious and Depressed  Type of Therapy: Individual Therapy  Treatment Goals addressed: Coping  Interventions: CBT, Motivational Interviewing, Solution Focused, Supportive, Family Systems and Reframing  Summary: [name] is a 55 y.o. female who presents with continued symptoms of her diagnosis. Therapist performed a brief mood check assessing happiness, sadness, excitement, anger, disgust, and fear.  Therapist provided active listening for Patient as she explained details of her week including current stressors and worries.  Therapist reviewed last week session with Patient. Therapist and Patient continued with discussion about avoiding triggers to anger.  Therapist requested that Patient be mindful of the things that cause her to become angry over the next week.  Therapist encouraged Patient to utilize a journal. Therapist educated Patient on the positive effects of a journal. Therapist requested that Patient note her reaction to the trigger as well. Discussion of Therapist going on Maternity Leave and provided patient with a list of therapist in the area.   Suicidal/Homicidal: Nowithout intent/plan  Therapist Response: LCSW provided Patient with ongoing emotional support and encouragement.  Normalized her feelings.  Commended Patient on her progress and reinforced the importance of client staying focused on her own strengths and resources and resiliency. Processed various strategies for dealing with stressors.    Plan: Follow up with list of therapist provided during session.  Diagnosis: Axis I: Bipolar, Depressed    Axis II: No diagnosis    Marinda Elk 01/13/2016

## 2016-01-23 ENCOUNTER — Ambulatory Visit: Payer: Self-pay | Admitting: Psychiatry

## 2016-02-19 ENCOUNTER — Ambulatory Visit (INDEPENDENT_AMBULATORY_CARE_PROVIDER_SITE_OTHER): Payer: Commercial Managed Care - PPO | Admitting: Podiatry

## 2016-02-19 ENCOUNTER — Encounter: Payer: Self-pay | Admitting: Podiatry

## 2016-02-19 ENCOUNTER — Ambulatory Visit (INDEPENDENT_AMBULATORY_CARE_PROVIDER_SITE_OTHER): Payer: Commercial Managed Care - PPO

## 2016-02-19 VITALS — BP 121/75 | HR 105 | Resp 16

## 2016-02-19 DIAGNOSIS — M2010 Hallux valgus (acquired), unspecified foot: Secondary | ICD-10-CM

## 2016-02-19 NOTE — Patient Instructions (Signed)
Pre-Operative Instructions  Congratulations, you have decided to take an important step to improving your quality of life.  You can be assured that the doctors of [facility] will be with you every step of the way.  1. Plan to be at the surgery center/hospital at least 1 (one) hour prior to your scheduled time unless otherwise directed by the surgical center/hospital staff.  You must have a responsible adult accompany you, remain during the surgery and drive you home.  Make sure you have directions to the surgical center/hospital and know how to get there on time. 2. For hospital based surgery you will need to obtain a history and physical form from your family physician within 1 month prior to the date of surgery- we will give you a form for you primary physician.  3. We make every effort to accommodate the date you request for surgery.  There are however, times where surgery dates or times have to be moved.  We will contact you as soon as possible if a change in schedule is required.   4. No Aspirin/Ibuprofen for one week before surgery.  If you are on aspirin, any non-steroidal anti-inflammatory medications (Mobic, Aleve, Ibuprofen) you should stop taking it 7 days prior to your surgery.  You make take Tylenol  For pain prior to surgery.  5. Medications- If you are taking daily heart and blood pressure medications, seizure, reflux, allergy, asthma, anxiety, pain or diabetes medications, make sure the surgery center/hospital is aware before the day of surgery so they may notify you which medications to take or avoid the day of surgery. 6. No food or drink after midnight the night before surgery unless directed otherwise by surgical center/hospital staff. 7. No alcoholic beverages 24 hours prior to surgery.  No smoking 24 hours prior to or 24 hours after surgery. 8. Wear loose pants or shorts- loose enough to fit over bandages, boots, and casts. 9. No slip on shoes, sneakers are best. 10. Bring  your boot with you to the surgery center/hospital.  Also bring crutches or a walker if your physician has prescribed it for you.  If you do not have this equipment, it will be provided for you after surgery. 11. If you have not been contracted by the surgery center/hospital by the day before your surgery, call to confirm the date and time of your surgery. 12. Leave-time from work may vary depending on the type of surgery you have.  Appropriate arrangements should be made prior to surgery with your employer. 13. Prescriptions will be provided immediately following surgery by your doctor.  Have these filled as soon as possible after surgery and take the medication as directed. 14. Remove nail polish on the operative foot. 15. Wash the night before surgery.  The night before surgery wash the foot and leg well with the antibacterial soap provided and water paying special attention to beneath the toenails and in between the toes.  Rinse thoroughly with water and dry well with a towel.  Perform this wash unless told not to do so by your physician.  Enclosed: 1 Ice pack (please put in freezer the night before surgery)   1 Hibiclens skin cleaner   Pre-op Instructions  If you have any questions regarding the instructions, do not hesitate to call our office.  [place]: [address] [place], [place] [address] [phone]  [place]: [address] [place], [place] [address] [phone]  [place]: [address]  [place], [place] [address] [phone]  Dr. Richard   Tuchman DPM, Dr. Norman Regal DPM Dr. Richard Sikora DPM, Dr. M. Todd Hyatt DPM, Dr. Kathryn Egerton DPM 

## 2016-02-19 NOTE — Progress Notes (Signed)
   Subjective:    Patient ID: [name], female    DOB: 04-May-1961, 55 y.o.   MRN: [id number]  HPI: 55 year old white female with chief complaint of pain to the first metatarsophalangeal joints times several years and just seems to be getting worse and she is getting older. 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 She states that the deformity is starting to limit her ability to perform her daily activities and her toes are starting to crossover and cause pain. She states that her mother has similar feet.    Review of Systems  All other systems reviewed and are negative.      Objective:   Physical Exam: I reviewed her past medical history medications allergies surgery social history and review of systems. Vital signs are stable she is alert and oriented 3 in no apparent distress. Pulses are strongly palpable bilateral. Neurologic sensorium is intact per Semmes-Weinstein monofilament. Deep tendon reflexes are intact bilateral muscle strength +5 over 5 dorsiflexion plantar flexors and inverters and everters all intrinsic musculature is intact. Orthopedic evaluation demonstrates all joints distal to the ankle for range of motion with a flexible flatfoot deformity bilateral. Hallux abductovalgus deformity with increase in the first intermetatarsal angle greater than normal value is easily reducible medial to lateral. Radiographs confirm pes planus bilateral with an increase in the first intermetatarsal angle greater than normal value. No osseous lesions and no fractures are identified. Cutaneous evaluation demonstrates no erythema edema cellulitis drainage or odor with the exception of some reactive erythema overlying the medial aspect of the first metatarsal head.        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  Plan: Discussed etiology pathology  conservative versus surgical therapies. At this point we consented [facility] today [facility] screw fixation left foot. I answered all the questions regarding this procedure to the best of my ability in layman's terms. She understands and is amenable to it. We did discuss the possible postop complications which may include but are not limited to postoperative pain bleeding swelling infection recurrence and need for further surgery. We dispensed a Cam Walker for her postop recovery and I will follow-up with her in a few weeks for surgery.

## 2016-02-27 ENCOUNTER — Ambulatory Visit (INDEPENDENT_AMBULATORY_CARE_PROVIDER_SITE_OTHER): Payer: 59 | Admitting: Psychiatry

## 2016-02-27 ENCOUNTER — Encounter: Payer: Self-pay | Admitting: Psychiatry

## 2016-02-27 VITALS — BP 122/84 | HR 81 | Temp 97.9°F | Ht 62.0 in | Wt 133.0 lb

## 2016-02-27 DIAGNOSIS — F331 Major depressive disorder, recurrent, moderate: Secondary | ICD-10-CM

## 2016-02-27 DIAGNOSIS — F411 Generalized anxiety disorder: Secondary | ICD-10-CM | POA: Diagnosis not present

## 2016-02-27 MED ORDER — VENLAFAXINE HCL ER 150 MG PO TB24: 150.0000 mg | ORAL_TABLET | Freq: Every morning | ORAL | Status: DC

## 2016-02-27 MED ORDER — BUSPIRONE HCL 5 MG PO TABS: 5.0000 mg | ORAL_TABLET | Freq: Two times a day (BID) | ORAL | Status: DC

## 2016-02-27 MED ORDER — QUETIAPINE FUMARATE 100 MG PO TABS: 100.0000 mg | ORAL_TABLET | Freq: Every day | ORAL | Status: DC

## 2016-02-27 MED ORDER — AMPHETAMINE-DEXTROAMPHETAMINE 10 MG PO TABS: 10.0000 mg | ORAL_TABLET | Freq: Every day | ORAL | Status: DC

## 2016-02-27 NOTE — Progress Notes (Signed)
BH MD/PA/NP OP Progress Note  02/27/2016 1:21 PM Jacqueline CaoMary K Harker  MRN:  [id number]  Subjective:   Patient is a 55 year old female who presented for the follow-up appointment. She reported that she continues to have problems with her concentration and anxiety and feels that her memory is not as sharp. She reported that she has tried to decrease the dose of Seroquel but it is not helpful. She reported that she wants to start taking the Adderall as she has taken in the past. Patient reported that she feels that the current combination of medication is not helping her. She currently denied having any suicidal ideations or plans. She is only working 40 hours per week where she cannot concentrate and give her full attention to work. She appeared calm during the interview. She currently denied having any worsening of her symptoms She currently denied having any mood swings anger anxiety or paranoia. She is still taking the gabapentin on a regular basis. She reported that she has some anxiety during the day and is willing to continue  the BuSpar She denied having any suicidal homicidal ideations. She appeared calm and collected during the interview.    Chief Complaint:   Visit Diagnosis:     ICD-9-CM ICD-10-CM   1. MDD (major depressive disorder), recurrent episode, moderate (HCC) 296.32 F33.1   2. Generalized anxiety disorder 300.02 F41.1     Past Medical History:  Past Medical History  Diagnosis Date  . Depression   . Hypertension   . Anxiety   . Bipolar 1 disorder [facility](HCC)     Past Surgical History  Procedure Laterality Date  . Wisdom tooth extraction     Family History:  Family History  Problem Relation Age of Onset  . Arthritis Mother   . Hypertension Mother   . Hyperlipidemia Mother   . Heart attack Father   . Bipolar disorder Brother   . Anxiety disorder Brother   . Depression Brother   . Depression Maternal Aunt    Social History:  Social History   Social History  . Marital  Status: Married    Spouse Name: N/A  . Number of Children: N/A  . Years of Education: N/A   Social History Main Topics  . Smoking status: Never Smoker   . Smokeless tobacco: Never Used  . Alcohol Use: No     Comment: social   . Drug Use: No  . Sexual Activity: No   Other Topics Concern  . Not on file   Social History Narrative   Additional History:  Pt stated that she works at [employer] and she is busy. She has been working full time.   Assessment:   Musculoskeletal: Strength & Muscle Tone: within normal limits Gait & Station: normal Patient leans: N/A  Psychiatric Specialty Exam: Anxiety Symptoms include nervous/anxious behavior. Patient reports no insomnia or nausea.      Review of Systems  Gastrointestinal: Negative for nausea.  Psychiatric/Behavioral: Positive for depression. The patient is nervous/anxious. The patient does not have insomnia.   All other systems reviewed and are negative.   Last menstrual period 05/28/2013.There is no weight on file to calculate BMI.  General Appearance: Casual  Eye Contact:  Fair  Speech:  Clear and Coherent  Volume:  Normal  Mood:  Depressed  Affect:  Congruent  Thought Process:  Coherent  Orientation:  Full (Time, Place, and Person)  Thought Content:  WDL  Suicidal Thoughts:  No    Homicidal Thoughts:  No  Memory:  NA  Judgement:  Fair  Insight:  Fair  Psychomotor Activity:  Normal  Concentration:  Fair  Recall:  Fair  Fund of Knowledge: Fair  Language: Fair  Akathisia:  No  Handed:  Right  AIMS (if indicated):  none  Assets:  Communication Skills Desire for Improvement Physical Health Social Support  ADL's:  Intact  Cognition: WNL  Sleep:  6-7   Is the patient at risk to self?  No. Has the patient been a risk to self in the past 6 months?  No. Has the patient been a risk to self within the distant past?  No. Is the patient a risk to others?  No. Has the patient been a risk to others in the past 6  months?  No. Has the patient been a risk to others within the distant past?  No.  Current Medications: Current Outpatient Prescriptions  Medication Sig Dispense Refill  . Biotin 1000 MCG tablet Take by mouth.    . busPIRone (BUSPAR) 5 MG tablet Take 1 tablet (5 mg total) by mouth 2 (two) times daily. 180 tablet 1  . Calcium Lactate 648 MG TABS Take by mouth.    . calcium-vitamin D (OSCAL WITH D) 500-200 MG-UNIT per tablet Take 1 tablet by mouth daily. For Vitamin D replacement and mood control. 30 tablet 0  . Cholecalciferol (VITAMIN D3) 5000 UNITS CAPS Take by mouth.    . gabapentin (NEURONTIN) 100 MG capsule Take 1 capsule (100 mg total) by mouth at bedtime. 90 capsule 3  . Multiple Vitamin (MULTIVITAMIN WITH MINERALS) TABS Take 1 tablet by mouth daily. For nutritional supplementation. 30 tablet 0  . QUEtiapine (SEROQUEL) 200 MG tablet Take 1 tablet (200 mg total) by mouth at bedtime. 90 tablet 1  . Venlafaxine HCl 150 MG TB24 1 tablet (150 mg total) by Does not apply route every morning. 90 tablet 3   No current facility-administered medications for this visit.    Medical Decision Making:  Review of Psycho-Social Stressors (1) and Review of Medication Regimen & Side Effects (2)  Treatment Plan Summary:Medication management   Depression  She will  continue on venlafaxine 150 mg in the morning.   Mood symptoms She will continue  Seroquel 100 mg at bedtime.  Anxiety Continue buspirone 5 mg by mouth twice a day  Patient will be started on Adderall 10 mg daily. Discussed with her the adverse effects in detail including weight loss palpitations insomnia and she demonstrated understanding.   Follow up in 1 months or earlier.   More than 50% of the time spent in psychoeducation, counseling and coordination of care.  Time spent with the patient 25 minutes   This note was generated in part or whole with voice recognition software. Voice regonition is usually quite accurate but  there are transcription errors that can and very often do occur. I apologize for any typographical errors that were not detected and corrected.   Brandy Hale, MD    02/27/2016, 1:21 PM

## 2016-03-18 ENCOUNTER — Ambulatory Visit: Payer: Commercial Managed Care - PPO | Admitting: Licensed Clinical Social Worker

## 2016-03-26 ENCOUNTER — Ambulatory Visit: Payer: Commercial Managed Care - PPO | Admitting: Psychiatry

## 2016-03-27 ENCOUNTER — Ambulatory Visit: Payer: Commercial Managed Care - PPO | Admitting: Licensed Clinical Social Worker

## 2016-04-29 ENCOUNTER — Ambulatory Visit: Payer: Commercial Managed Care - PPO | Admitting: Licensed Clinical Social Worker

## 2016-05-04 ENCOUNTER — Ambulatory Visit (INDEPENDENT_AMBULATORY_CARE_PROVIDER_SITE_OTHER): Payer: Commercial Managed Care - PPO | Admitting: Licensed Clinical Social Worker

## 2016-05-04 DIAGNOSIS — F319 Bipolar disorder, unspecified: Secondary | ICD-10-CM

## 2016-05-11 NOTE — Progress Notes (Signed)
   THERAPIST PROGRESS NOTE  Session Time: 60min  Participation Level: Minimal  Behavioral Response: CasualAlertDepressed  Type of Therapy: Individual Therapy  Treatment Goals addressed: Diagnosis: Bipolar  Interventions: CBT, Motivational Interviewing, Solution Focused, Strength-based, Supportive and Reframing  Summary: [name] is a 55 y.o. female who presents with continued symptoms of her diagnosis.LCSW discussed what psychotherapy is and is not and the importance of the therapeutic relationship to include open and honest communication between client and therapist and building trust.  Reviewed advantages and disadvantages of the therapeutic process and limitations to the therapeutic relationship including LCSW's role in maintaining the safety of the client, others and those in client's care.  Discussion of her current stressors since the past session.  Review of her current relationships with her boyfriend, mother and her sister.  Discussion of her apprehension of medication and her symptoms while she is at her boyfriend's residence   Suicidal/Homicidal: Nowithout intent/plan  Therapist Response: LCSW provided Patient with ongoing emotional support and encouragement.  Normalized her feelings.  Commended Patient on her progress and reinforced the importance of client staying focused on her own strengths and resources and resiliency. Processed various strategies for dealing with stressors.    Plan: Return again in 2 weeks.  Diagnosis: Axis I: Bipolar, Depressed    Axis II: No diagnosis    Marinda Elkicole M Kaseem Vastine, LCSW 05/04/2016

## 2016-05-12 ENCOUNTER — Ambulatory Visit (INDEPENDENT_AMBULATORY_CARE_PROVIDER_SITE_OTHER): Payer: Commercial Managed Care - PPO | Admitting: Psychiatry

## 2016-05-12 ENCOUNTER — Ambulatory Visit: Payer: Self-pay | Admitting: Psychiatry

## 2016-05-18 ENCOUNTER — Ambulatory Visit: Payer: Commercial Managed Care - PPO | Admitting: Licensed Clinical Social Worker

## 2016-05-21 ENCOUNTER — Other Ambulatory Visit: Payer: Self-pay | Admitting: Psychiatry

## 2016-05-22 ENCOUNTER — Ambulatory Visit: Payer: Commercial Managed Care - PPO | Admitting: Psychiatry

## 2016-05-29 ENCOUNTER — Other Ambulatory Visit: Payer: Self-pay | Admitting: Psychiatry

## 2016-05-29 ENCOUNTER — Telehealth: Payer: Self-pay

## 2016-05-29 NOTE — Telephone Encounter (Signed)
pt called stated that she will not have enough medication to last until her appt. pt has appt for 06-05-16

## 2016-05-29 NOTE — Telephone Encounter (Signed)
rx for 8 tablets was called in. pt needs to show up for her appt.

## 2016-06-04 ENCOUNTER — Ambulatory Visit: Payer: Commercial Managed Care - PPO | Admitting: Licensed Clinical Social Worker

## 2016-06-04 ENCOUNTER — Other Ambulatory Visit: Payer: Self-pay

## 2016-06-04 NOTE — Telephone Encounter (Signed)
Pt needs refill on medication

## 2016-06-05 ENCOUNTER — Ambulatory Visit: Payer: Commercial Managed Care - PPO | Admitting: Psychiatry

## 2016-06-08 MED ORDER — VENLAFAXINE HCL ER 150 MG PO TB24: 150.0000 mg | ORAL_TABLET | Freq: Every morning | ORAL | Status: DC

## 2016-06-12 ENCOUNTER — Ambulatory Visit: Payer: Commercial Managed Care - PPO | Admitting: Licensed Clinical Social Worker

## 2016-06-17 ENCOUNTER — Ambulatory Visit: Payer: Commercial Managed Care - PPO | Admitting: Psychiatry

## 2016-06-22 ENCOUNTER — Ambulatory Visit: Payer: Commercial Managed Care - PPO | Admitting: Psychiatry

## 2016-08-06 ENCOUNTER — Other Ambulatory Visit: Payer: Self-pay | Admitting: Psychiatry

## 2016-08-11 ENCOUNTER — Ambulatory Visit: Payer: Commercial Managed Care - PPO | Admitting: Psychiatry

## 2016-08-13 ENCOUNTER — Ambulatory Visit (INDEPENDENT_AMBULATORY_CARE_PROVIDER_SITE_OTHER): Payer: Commercial Managed Care - PPO | Admitting: Licensed Clinical Social Worker

## 2016-08-13 DIAGNOSIS — F331 Major depressive disorder, recurrent, moderate: Secondary | ICD-10-CM | POA: Diagnosis not present

## 2016-08-14 ENCOUNTER — Ambulatory Visit: Payer: Self-pay | Admitting: Licensed Clinical Social Worker

## 2016-08-17 ENCOUNTER — Ambulatory Visit (INDEPENDENT_AMBULATORY_CARE_PROVIDER_SITE_OTHER): Payer: Commercial Managed Care - PPO | Admitting: Psychiatry

## 2016-08-17 ENCOUNTER — Encounter: Payer: Self-pay | Admitting: Psychiatry

## 2016-08-17 VITALS — BP 154/76 | HR 64 | Temp 97.8°F | Ht 62.0 in | Wt 137.2 lb

## 2016-08-17 DIAGNOSIS — F330 Major depressive disorder, recurrent, mild: Secondary | ICD-10-CM | POA: Diagnosis not present

## 2016-08-17 DIAGNOSIS — F411 Generalized anxiety disorder: Secondary | ICD-10-CM | POA: Diagnosis not present

## 2016-08-17 MED ORDER — VENLAFAXINE HCL ER 150 MG PO TB24: 150.0000 mg | ORAL_TABLET | Freq: Every morning | ORAL | 1 refills | Status: DC

## 2016-08-17 MED ORDER — QUETIAPINE FUMARATE 50 MG PO TABS: 50.0000 mg | ORAL_TABLET | Freq: Every day | ORAL | 1 refills | Status: DC

## 2016-08-17 NOTE — Progress Notes (Signed)
BH MD/PA/NP OP Progress Note  08/17/2016 1:29 PM Jacqueline CaoMary K Haynes  MRN:  [id number]  Subjective:   Patient is a 55 year old female who presented for the follow-up appointment. She was last seen in April. Patient reported that she has been sleeping with the help of Seroquel but she does not want to continue taking the medication on a regular basis as she does not want to become dependent on the medication. We discussed about her medications at length. She reported that she feels well on the Seroquel but she is interested in decreasing the dose of medication at this time. She has already stopped taking the gabapentin and BuSpar at this time. She takes venlafaxine in the morning. She appeared calm and alert during the interview. She denied having any suicidal ideations or plans. She reported that she still works at [employer]. She is also following with Joni ReiningNicole on a regular basis.  She reported that she has some anxiety during the day She denied having any suicidal homicidal ideations. She appeared calm and collected during the interview.    Chief Complaint:  Chief Complaint    Follow-up; Medication Refill     Visit Diagnosis:     ICD-9-CM ICD-10-CM   1. 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 Anxiety state 300.00 F41.1     Past Medical History:  Past Medical History:  Diagnosis Date  . Anxiety   . Bipolar 1 disorder (HCC)   . Depression   . Hypertension     Past Surgical History:  Procedure Laterality Date  . WISDOM TOOTH EXTRACTION     Family History:  Family History  Problem Relation Age of Onset  . Arthritis Mother   . Hypertension Mother   . Hyperlipidemia Mother   . Heart attack Father   . Bipolar disorder Brother   . Anxiety disorder Brother   . Depression Brother   . Depression Maternal Aunt    Social History:  Social History   Social History  . Marital status: Married    Spouse name: N/A  . Number of children: N/A  . Years  of education: N/A   Social History Main Topics  . Smoking status: Never Smoker  . Smokeless tobacco: Never Used  . Alcohol use No     Comment: social   . Drug use: No  . Sexual activity: No   Other Topics Concern  . None   Social History Narrative  . None   Additional History:  Pt stated that she works at [employer] and she is busy. She has been working full time.   Assessment:   Musculoskeletal: Strength & Muscle Tone: within normal limits Gait & Station: normal Patient leans: N/A  Psychiatric Specialty Exam: Anxiety  Symptoms include nervous/anxious behavior. Patient reports no insomnia or nausea.      Review of Systems  Gastrointestinal: Negative for nausea.  Psychiatric/Behavioral: Positive for depression. The patient is nervous/anxious. The patient does not have insomnia.   All other systems reviewed and are negative.   Blood pressure (!) 154/76, pulse 64, temperature 97.8 F (36.6 C), temperature source Oral, height 5\' 2"  (1.575 m), weight 137 lb 3.2 oz (62.2 kg), last menstrual period 05/28/2013.Body mass index is 25.09 kg/m.  General Appearance: Casual  Eye Contact:  Fair  Speech:  Clear and Coherent  Volume:  Normal  Mood:  Depressed  Affect:  Congruent  Thought Process:  Coherent  Orientation:  Full (Time, Place, and Person)  Thought Content:  WDL  Suicidal Thoughts:  No    Homicidal Thoughts:  No  Memory:  NA  Judgement:  Fair  Insight:  Fair  Psychomotor Activity:  Normal  Concentration:  Fair  Recall:  Fair  Fund of Knowledge: Fair  Language: Fair  Akathisia:  No  Handed:  Right  AIMS (if indicated):  none  Assets:  Communication Skills Desire for Improvement Physical Health Social Support  ADL's:  Intact  Cognition: WNL  Sleep:  6-7   Is the patient at risk to self?  No. Has the patient been a risk to self in the past 6 months?  No. Has the patient been a risk to self within the distant past?  No. Is the patient a risk to others?   No. Has the patient been a risk to others in the past 6 months?  No. Has the patient been a risk to others within the distant past?  No.  Current Medications: Current Outpatient Prescriptions  Medication Sig Dispense Refill  . amphetamine-dextroamphetamine (ADDERALL) 10 MG tablet Take 1 tablet (10 mg total) by mouth daily with breakfast. 30 tablet 0  . Biotin 1000 MCG tablet Take by mouth.    . busPIRone (BUSPAR) 5 MG tablet Take 1 tablet (5 mg total) by mouth 2 (two) times daily. 60 tablet 1  . Calcium Lactate 648 MG TABS Take by mouth.    . calcium-vitamin D (OSCAL WITH D) 500-200 MG-UNIT per tablet Take 1 tablet by mouth daily. For Vitamin D replacement and mood control. 30 tablet 0  . Cholecalciferol (VITAMIN D3) 5000 UNITS CAPS Take by mouth.    . gabapentin (NEURONTIN) 100 MG capsule Take 1 capsule (100 mg total) by mouth at bedtime. 90 capsule 3  . Multiple Vitamin (MULTIVITAMIN WITH MINERALS) TABS Take 1 tablet by mouth daily. For nutritional supplementation. 30 tablet 0  . QUEtiapine (SEROQUEL) 100 MG tablet Take 1 tablet (100 mg total) by mouth at bedtime. 30 tablet 1  . Venlafaxine HCl 150 MG TB24 TAKE 1 TABLET (150 MG TOTAL) BY MOUTH EVERY MORNING. 30 tablet 1   No current facility-administered medications for this visit.     Medical Decision Making:  Review of Psycho-Social Stressors (1) and Review of Medication Regimen & Side Effects (2)  Treatment Plan Summary:Medication management   Depression  She will  continue on venlafaxine 150 mg in the morning.   Mood symptoms She will continue  Seroquel 50 mg at bedtime.  Follow up in 2 months or earlier.   More than 50% of the time spent in psychoeducation, counseling and coordination of care.   This note was generated in part or whole with voice recognition software. Voice regonition is usually quite accurate but there are transcription errors that can and very often do occur. I apologize for any typographical errors  that were not detected and corrected.   Brandy Hale, MD    08/17/2016, 1:29 PM

## 2016-08-18 ENCOUNTER — Telehealth: Payer: Self-pay | Admitting: *Deleted

## 2016-08-18 NOTE — Telephone Encounter (Signed)
"  I had been there back in March about possibly getting my other foot done, surgery.  I was wondering what's the next available date for that.  Give me a call back."

## 2016-08-20 ENCOUNTER — Telehealth: Payer: Self-pay

## 2016-08-20 NOTE — Telephone Encounter (Signed)
pt states that she seen you this week.  at the appt you told her that she could change from venlafaxine to lexapro.  pt called stated that she wanted to try the lexapro.  [facility]

## 2016-08-20 NOTE — Telephone Encounter (Signed)
She cannot be started on lexapro and venlafaxine at the same time. She just decreased  seroquel 50mg  . Please advise her to make appt next week to discuss med changes.

## 2016-08-21 NOTE — Telephone Encounter (Signed)
t called back.  pt was up set that dr. Garnetta Buddyfaheem wanted to see her back in order to make changes.  pt states that she was just seen on  08-17-16 and that she doesnt have the money to come back.  pt states that it was discussed in the office meeting about changing medications.  Pt wants to know if you can just call her please to talk about this.

## 2016-08-21 NOTE — Telephone Encounter (Signed)
left message that pt will need to be seen for medication changes. left message that pt will need to be seen for medication changes.

## 2016-08-25 NOTE — Progress Notes (Signed)
   THERAPIST PROGRESS NOTE  Session Time: 5990  Participation Level: Active  Behavioral Response: Neat and Well GroomedAlertEuthymic  Type of Therapy: Group Therapy  Treatment Goals addressed: Communication: Styles & Strategies  Interventions: Solution Focused  Summary: Jacqueline CaoMary K Haynes is a 55 y.o. female who presents with continued symptoms of her diagnosis.   Patient introduced self and was able to list her reason for attending.  Patient was able to rate her symptoms on a scale of 1-10 and listed reasons she chose that particular number.  Patient was able to discuss her current struggles.  Patient was open and interactive throughout the session with open dialogue with the other attendees.  Patient was able to list communication strategies that she current uses that are not working.  Patient was able to learn new strategies to assist with positive outcomes.      Suicidal/Homicidal: No  Therapist Response: LCSW provided Patient with ongoing emotional support and encouragement.  Normalized her feelings.  Commended Patient on her progress and reinforced the importance of client staying focused on her own strengths and resources and resiliency. Processed various strategies for dealing with stressors.    Plan: Return again in 2 weeks.  Diagnosis: Axis I: Depression, Moderate    Axis II: No diagnosis    Marinda Elkicole M Peacock, LCSW 08/25/2016

## 2016-08-27 ENCOUNTER — Ambulatory Visit (INDEPENDENT_AMBULATORY_CARE_PROVIDER_SITE_OTHER): Payer: 59 | Admitting: Psychiatry

## 2016-08-27 ENCOUNTER — Ambulatory Visit (INDEPENDENT_AMBULATORY_CARE_PROVIDER_SITE_OTHER): Payer: 59 | Admitting: Licensed Clinical Social Worker

## 2016-08-27 DIAGNOSIS — F330 Major depressive disorder, recurrent, mild: Secondary | ICD-10-CM

## 2016-08-27 DIAGNOSIS — F411 Generalized anxiety disorder: Secondary | ICD-10-CM | POA: Diagnosis not present

## 2016-08-27 MED ORDER — ESCITALOPRAM OXALATE 10 MG PO TABS: 10.0000 mg | ORAL_TABLET | Freq: Every day | ORAL | 0 refills | Status: DC

## 2016-08-27 MED ORDER — VENLAFAXINE HCL ER 75 MG PO CP24: 75.0000 mg | ORAL_CAPSULE | Freq: Every day | ORAL | 0 refills | Status: DC

## 2016-08-27 NOTE — Telephone Encounter (Signed)
pt was seen today.

## 2016-08-27 NOTE — Telephone Encounter (Signed)
I am returning your call the next available date regarding the question you asked is October 23, 2016.  If you have any further questions give me a call.

## 2016-08-27 NOTE — Progress Notes (Signed)
BH MD/PA/NP OP Progress Note  08/27/2016 2:00 PM [name]  MRN:  [id number]  Subjective:   Patient is a 55 year old female who presented for the follow-up appointment. She reported that she has been feeling more anxious and wants to have her medications adjusted. She reported that she has decrease the dose of Seroquel but is not sleeping well at night. She wants to change the Effexor as well. She feels that the Effexor is not helping her at this time. She currently denied having any suicidal ideations or plans. She appeared apprehensive during the interview.   We have discussed about changing her to Lexapro at the last appointment and she is agreeable to the plan. She has been compliant with her medications. She reported that she still works at [employer]. She is also following with Joni Reining on a regular basis.  She reported that she has some anxiety during the day She denied having any suicidal homicidal ideations.     Chief Complaint:   Visit Diagnosis:     ICD-9-CM ICD-10-CM   1. 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 Anxiety state 300.00 F41.1     Past Medical History:  Past Medical History:  Diagnosis Date  . Anxiety   . Bipolar 1 disorder (HCC)   . Depression   . Hypertension     Past Surgical History:  Procedure Laterality Date  . WISDOM TOOTH EXTRACTION     Family History:  Family History  Problem Relation Age of Onset  . Arthritis Mother   . Hypertension Mother   . Hyperlipidemia Mother   . Heart attack Father   . Bipolar disorder Brother   . Anxiety disorder Brother   . Depression Brother   . Depression Maternal Aunt    Social History:  Social History   Social History  . Marital status: Married    Spouse name: N/A  . Number of children: N/A  . Years of education: N/A   Social History Main Topics  . Smoking status: Never Smoker  . Smokeless tobacco: Never Used  . Alcohol use No     Comment: social   . Drug  use: No  . Sexual activity: No   Other Topics Concern  . Not on file   Social History Narrative  . No narrative on file   Additional History:  Pt stated that she works at [employer] and she is busy. She has been working full time.   Assessment:   Musculoskeletal: Strength & Muscle Tone: within normal limits Gait & Station: normal Patient leans: N/A  Psychiatric Specialty Exam: Anxiety  Symptoms include nervous/anxious behavior. Patient reports no insomnia or nausea.      Review of Systems  Gastrointestinal: Negative for nausea.  Psychiatric/Behavioral: Positive for depression. The patient is nervous/anxious. The patient does not have insomnia.   All other systems reviewed and are negative.   Last menstrual period 05/28/2013.There is no height or weight on file to calculate BMI.  General Appearance: Casual  Eye Contact:  Fair  Speech:  Clear and Coherent  Volume:  Normal  Mood:  Depressed  Affect:  Congruent  Thought Process:  Coherent  Orientation:  Full (Time, Place, and Person)  Thought Content:  WDL  Suicidal Thoughts:  No    Homicidal Thoughts:  No  Memory:  NA  Judgement:  Fair  Insight:  Fair  Psychomotor Activity:  Normal  Concentration:  Fair  Recall:  [employer] of  Knowledge: Fair  Language: Fair  Akathisia:  No  Handed:  Right  AIMS (if indicated):  none  Assets:  Communication Skills Desire for Improvement Physical Health Social Support  ADL's:  Intact  Cognition: WNL  Sleep:  6-7   Is the patient at risk to self?  No. Has the patient been a risk to self in the past 6 months?  No. Has the patient been a risk to self within the distant past?  No. Is the patient a risk to others?  No. Has the patient been a risk to others in the past 6 months?  No. Has the patient been a risk to others within the distant past?  No.  Current Medications: Current Outpatient Prescriptions  Medication Sig Dispense Refill  . Biotin 1000 MCG tablet Take by  mouth.    . Calcium Lactate 648 MG TABS Take by mouth.    . calcium-vitamin D (OSCAL WITH D) 500-200 MG-UNIT per tablet Take 1 tablet by mouth daily. For Vitamin D replacement and mood control. 30 tablet 0  . Cholecalciferol (VITAMIN D3) 5000 UNITS CAPS Take by mouth.    . Multiple Vitamin (MULTIVITAMIN WITH MINERALS) TABS Take 1 tablet by mouth daily. For nutritional supplementation. 30 tablet 0  . QUEtiapine (SEROQUEL) 50 MG tablet Take 1 tablet (50 mg total) by mouth at bedtime. 90 tablet 1  . Venlafaxine HCl 150 MG TB24 Take 1 tablet (150 mg total) by mouth every morning. 90 tablet 1   No current facility-administered medications for this visit.     Medical Decision Making:  Review of Psycho-Social Stressors (1) and Review of Medication Regimen & Side Effects (2)  Treatment Plan Summary:Medication management   Depression  I will decrease venlafaxine XR 75 mg in the morning. Also start her on Lexapro 5 mg daily for 2 weeks and then titrate to 10 mg daily. Patient agreed with the plan.   Mood symptoms She will continue  Seroquel 50 mg at bedtime.  Follow up in 2 weeks  or earlier.   More than 50% of the time spent in psychoeducation, counseling and coordination of care.   This note was generated in part or whole with voice recognition software. Voice regonition is usually quite accurate but there are transcription errors that can and very often do occur. I apologize for any typographical errors that were not detected and corrected.   Brandy HaleUzma Elyna Pangilinan, MD    08/27/2016, 2:00 PM

## 2016-09-01 NOTE — Progress Notes (Signed)
   THERAPIST PROGRESS NOTE  Session Time: 3960  Participation Level: Active  Behavioral Response: Neat and Well GroomedAlertDepressed  Type of Therapy: Individual Therapy  Treatment Goals addressed: Communication: Strategies and Coping  Interventions: CBT and Motivational Interviewing  Summary: Jacqueline CaoMary K Haynes is a 55 y.o. female who presents with continued symptoms of her diagnosis.  Patient was able to define "managing conflict" she reports that she occasionally has difficulty with communicating once angry or upset.  Patient was able to participate in the therapeutic activity on conflict management.  Patient role played the skill components.  Patient asked age appropriate questions for clarity.  Patient gave scenarios of situations that she used irrational conflict management skills and role played with Therapist alternate solutions.   Suicidal/Homicidal: No  Therapist Response:Provided support for Patient as she discussed her current mood. Stressed the importance of mood stabilization through learned coping skills and medication management.  Encouraged Patient to focus on her strengths and the positive aspects.  Plan: Return again in 2weeks.  Diagnosis: Axis I: Depression    Axis II: No diagnosis    Marinda Elkicole M Brigetta Beckstrom, LCSW 08/27/2016

## 2016-10-05 ENCOUNTER — Other Ambulatory Visit: Payer: Self-pay | Admitting: Psychiatry

## 2016-10-13 ENCOUNTER — Ambulatory Visit (INDEPENDENT_AMBULATORY_CARE_PROVIDER_SITE_OTHER): Payer: Commercial Managed Care - PPO | Admitting: Podiatry

## 2016-10-13 ENCOUNTER — Encounter: Payer: Self-pay | Admitting: Podiatry

## 2016-10-13 VITALS — BP 128/80 | HR 73 | Resp 16

## 2016-10-13 DIAGNOSIS — M2010 Hallux valgus (acquired), unspecified foot: Secondary | ICD-10-CM | POA: Diagnosis not present

## 2016-10-13 DIAGNOSIS — M79672 Pain in left foot: Secondary | ICD-10-CM | POA: Diagnosis not present

## 2016-10-13 DIAGNOSIS — M21619 Bunion of unspecified foot: Secondary | ICD-10-CM | POA: Diagnosis not present

## 2016-10-13 DIAGNOSIS — M76822 Posterior tibial tendinitis, left leg: Secondary | ICD-10-CM

## 2016-10-13 DIAGNOSIS — M79671 Pain in right foot: Secondary | ICD-10-CM

## 2016-10-13 DIAGNOSIS — M2142 Flat foot [pes planus] (acquired), left foot: Secondary | ICD-10-CM | POA: Diagnosis not present

## 2016-10-13 DIAGNOSIS — M76821 Posterior tibial tendinitis, right leg: Secondary | ICD-10-CM | POA: Diagnosis not present

## 2016-10-13 DIAGNOSIS — M2141 Flat foot [pes planus] (acquired), right foot: Secondary | ICD-10-CM | POA: Diagnosis not present

## 2016-10-13 MED ORDER — MELOXICAM 15 MG PO TABS: 15.0000 mg | ORAL_TABLET | Freq: Every day | ORAL | 1 refills | Status: AC

## 2016-10-13 MED ORDER — BETAMETHASONE SOD PHOS & ACET 6 (3-3) MG/ML IJ SUSP: 3.0000 mg | Freq: Once | INTRAMUSCULAR | Status: DC

## 2016-10-13 NOTE — Progress Notes (Signed)
Subjective:  Patient presents to the office today with a new complaint of pain to her bilateral feet which is been going on for a few weeks now. Patient states that she notices tightness with pain and pulling in the arches of her feet. Patient was last seen for bunion deformity on 02/19/2016 under the care of Dr. Al CorpusHyatt. Patient presents today for further treatment and evaluation    Objective/Physical Exam General: The patient is alert and oriented x3 in no acute distress.  Dermatology: Skin is warm, dry and supple bilateral lower extremities. Negative for open lesions or macerations.  Vascular: Palpable pedal pulses bilaterally. No edema or erythema noted. Capillary refill within normal limits.  Neurological: Epicritic and protective threshold grossly intact bilaterally.   Musculoskeletal Exam: Pain on palpation is noted along the posterior tibial tendons bilaterally extending distally to the level of the porta pedis. Patient also has excessive pronation of the foot during weightbearing with strain along the medial aspect of the foot and ankle.  Large bunion deformity also noted to the bilateral great toes with a hallux abductus angle greater than 30.  Assessment: #1 posterior tibial tendinitis bilateral #2 hallux abductovalgus lateral #3 pes planus bilateral #4 pain in bilateral feet   Plan of Care:  #1 Patient was evaluated. #2 injection of 0.5 mL Celestone Soluspan injected into the bilateral posterior tibial tendon sheaths at the level of the porta pedis. #3 today we discussed the conservative versus surgical management of bunion deformity. At the moment the patient opts for conservative management however beginning next year she may consider surgical intervention. #4 prescription for meloxicam 15 mg #5 today the patient was scanned for custom molded orthotics #6 return to clinic in 3 weeks for orthotic pickup and discuss possible surgery for bunion deformity   Dr. Felecia ShellingBrent M.  Daurice Ovando, DPM [facility]

## 2016-10-15 ENCOUNTER — Other Ambulatory Visit: Payer: Self-pay | Admitting: Psychiatry

## 2016-10-26 ENCOUNTER — Encounter: Payer: Self-pay | Admitting: *Deleted

## 2016-11-06 ENCOUNTER — Encounter: Payer: Self-pay | Admitting: Podiatry

## 2016-11-06 ENCOUNTER — Ambulatory Visit (INDEPENDENT_AMBULATORY_CARE_PROVIDER_SITE_OTHER): Payer: Commercial Managed Care - PPO | Admitting: Podiatry

## 2016-11-06 DIAGNOSIS — M21619 Bunion of unspecified foot: Secondary | ICD-10-CM | POA: Diagnosis not present

## 2016-11-06 DIAGNOSIS — M76822 Posterior tibial tendinitis, left leg: Secondary | ICD-10-CM | POA: Diagnosis not present

## 2016-11-06 DIAGNOSIS — M79671 Pain in right foot: Secondary | ICD-10-CM

## 2016-11-06 DIAGNOSIS — M79672 Pain in left foot: Secondary | ICD-10-CM

## 2016-11-06 DIAGNOSIS — M2141 Flat foot [pes planus] (acquired), right foot: Secondary | ICD-10-CM

## 2016-11-06 DIAGNOSIS — M76821 Posterior tibial tendinitis, right leg: Secondary | ICD-10-CM

## 2016-11-06 DIAGNOSIS — M2142 Flat foot [pes planus] (acquired), left foot: Secondary | ICD-10-CM

## 2016-11-06 DIAGNOSIS — M2010 Hallux valgus (acquired), unspecified foot: Secondary | ICD-10-CM | POA: Diagnosis not present

## 2016-11-06 NOTE — Patient Instructions (Signed)

## 2016-11-08 MED ORDER — BETAMETHASONE SOD PHOS & ACET 6 (3-3) MG/ML IJ SUSP: 3.0000 mg | Freq: Once | INTRAMUSCULAR | Status: DC

## 2016-11-08 NOTE — Progress Notes (Signed)
Subjective:  Patient presents to the office today for follow-up evaluation of hallux abductovalgus with bunion deformity bilaterally as well as bilateral foot pain. On last office visit we discussed conservative versus surgical management of bunion deformity. Patient presents today for further discussion and follow-up evaluation of bilateral foot pain.  Patient is a LawyerCNA at [employer]    Objective/Physical Exam General: The patient is alert and oriented x3 in no acute distress.  Dermatology: Skin is warm, dry and supple bilateral lower extremities. Negative for open lesions or macerations.  Vascular: Palpable pedal pulses bilaterally. No edema or erythema noted. Capillary refill within normal limits.  Neurological: Epicritic and protective threshold grossly intact bilaterally.   Musculoskeletal Exam: Pain on palpation is noted along the posterior tibial tendons bilaterally extending distally to the level of the porta pedis. Patient also has excessive pronation of the foot during weightbearing with strain along the medial aspect of the foot and ankle.  Large bunion deformity also noted to the bilateral great toes with a hallux abductus angle greater than 30.  Radiographic Exam:  Previous x-rays taken 02/19/2016 indicate increased intermetatarsal angle greater than 15 with a hallux abductus angle greater than 30 bilateral lower extremities.  Assessment: #1 painful symptomatic bunion deformity bilateral lower extremities. More symptomatic on the left lower extremity #2 posterior tibial tendinitis left lower extremity #3 pes planus bilateral #4 pain in bilateral feet   Plan of Care:  #1 Patient was evaluated. #2 injection 0.5 mL Celestone Soluspan injected in the posterior tibial tendon sheath of the left lower extremity #3 today we discussed in detail the conservative versus surgical management of bunion deformity. Patient opts for surgical correction. All  possible, occasions and details of the procedure were explained. No guarantees were expressed or implied. #4 also discussed bilateral surgery versus one foot at a time. The patient will undergo one surgery at a time. Patient wants surgery on her left lower extremity because of his more symptomatic currently. #5 authorization for surgery was initiated today. Surgery will consist of bunionectomy with first metatarsal osteotomy left lower extremity and possible Akin bunionectomy osteotomy left lower extremity #6 return to clinic 1 week postop   Felecia ShellingBrent M. Evans, DPM [facility]  Dr. Felecia ShellingBrent M. Evans, DPM   [address]                                        [place], [place] [address]                Office [phone]  Fax [phone]

## 2016-11-10 ENCOUNTER — Ambulatory Visit: Payer: Commercial Managed Care - PPO | Admitting: Psychiatry

## 2016-11-20 ENCOUNTER — Telehealth: Payer: Self-pay | Admitting: *Deleted

## 2016-11-20 NOTE — Telephone Encounter (Signed)
"  I just called earlier about scheduling that surgery.  I scheduled for the 26th.  The way they make my schedule out at work is month to month.  So actually February 1 will be a lot better.  Call me to confirm that.  Thank you."

## 2016-11-20 NOTE — Telephone Encounter (Signed)
"  I want to schedule surgery with Dr. Logan BoresEvans."  Do you have a date in mind?  "No, I don't."  He can do it any Thursday in January except for January 11.  "Put me down for January 25 for now.  I need to check with my job to see if that date is okay.  If it's not I will give you a call back."  You can register with the surgical center once you find out if this date is okay.  Instructions are in the brochure from the surgical center that was give to you.  Someone from the surgical center will call you a day or two prior to surgery date with the arrival time.

## 2016-12-02 NOTE — Telephone Encounter (Signed)
I attempted to call patient.  I left her a message that we were going to reschedule her surgery from 12/17/2016 to 12/24/2016.   I left Aram BeechamCynthia a message at [facility] to reschedule surgery from 12/17/2016 to 12/24/2016.

## 2016-12-03 ENCOUNTER — Other Ambulatory Visit: Payer: Self-pay | Admitting: Obstetrics & Gynecology

## 2016-12-03 DIAGNOSIS — R928 Other abnormal and inconclusive findings on diagnostic imaging of breast: Secondary | ICD-10-CM

## 2016-12-07 ENCOUNTER — Ambulatory Visit
Admission: RE | Admit: 2016-12-07 | Discharge: 2016-12-07 | Disposition: A | Discharge status: Home / Self Care | Payer: Commercial Managed Care - PPO | Source: Ambulatory Visit | Attending: Obstetrics & Gynecology | Admitting: Obstetrics & Gynecology

## 2016-12-07 DIAGNOSIS — R928 Other abnormal and inconclusive findings on diagnostic imaging of breast: Secondary | ICD-10-CM

## 2016-12-09 ENCOUNTER — Ambulatory Visit: Payer: Commercial Managed Care - PPO | Admitting: Psychiatry

## 2016-12-10 ENCOUNTER — Ambulatory Visit: Payer: Commercial Managed Care - PPO | Admitting: Podiatry

## 2016-12-11 ENCOUNTER — Telehealth: Payer: Self-pay | Admitting: *Deleted

## 2016-12-11 NOTE — Telephone Encounter (Signed)
"  I'm calling about my bunion surgery."  I attempted to return patient's call.  I left her a message to call me back on Monday.

## 2016-12-16 NOTE — Telephone Encounter (Signed)
I attempted to return her call.  I left patient a message to call me back.

## 2016-12-17 NOTE — Telephone Encounter (Signed)
I'm returning your call how can I help you.  "I'm not sure what I called about.  I got all my papers turned in for my foot surgery."

## 2016-12-18 ENCOUNTER — Ambulatory Visit (INDEPENDENT_AMBULATORY_CARE_PROVIDER_SITE_OTHER): Payer: Commercial Managed Care - PPO | Admitting: Podiatry

## 2016-12-18 DIAGNOSIS — M21619 Bunion of unspecified foot: Secondary | ICD-10-CM

## 2016-12-18 DIAGNOSIS — M79671 Pain in right foot: Secondary | ICD-10-CM

## 2016-12-18 DIAGNOSIS — M2010 Hallux valgus (acquired), unspecified foot: Secondary | ICD-10-CM | POA: Diagnosis not present

## 2016-12-18 DIAGNOSIS — M76822 Posterior tibial tendinitis, left leg: Secondary | ICD-10-CM

## 2016-12-18 DIAGNOSIS — M79672 Pain in left foot: Secondary | ICD-10-CM

## 2016-12-18 MED ORDER — MELOXICAM 15 MG PO TABS: 15.0000 mg | ORAL_TABLET | Freq: Every day | ORAL | 1 refills | Status: AC

## 2016-12-22 ENCOUNTER — Encounter: Payer: Self-pay | Admitting: Podiatry

## 2016-12-22 ENCOUNTER — Ambulatory Visit (INDEPENDENT_AMBULATORY_CARE_PROVIDER_SITE_OTHER): Payer: Commercial Managed Care - PPO | Admitting: Podiatry

## 2016-12-22 DIAGNOSIS — M79671 Pain in right foot: Secondary | ICD-10-CM | POA: Diagnosis not present

## 2016-12-22 DIAGNOSIS — M79672 Pain in left foot: Secondary | ICD-10-CM

## 2016-12-22 DIAGNOSIS — M2010 Hallux valgus (acquired), unspecified foot: Secondary | ICD-10-CM

## 2016-12-22 DIAGNOSIS — M21619 Bunion of unspecified foot: Secondary | ICD-10-CM

## 2016-12-22 MED ORDER — ENOXAPARIN SODIUM 40 MG/0.4ML ~~LOC~~ SOLN: 40.0000 mg | SUBCUTANEOUS | 0 refills | Status: DC

## 2016-12-24 DIAGNOSIS — M79676 Pain in unspecified toe(s): Secondary | ICD-10-CM

## 2016-12-27 NOTE — Progress Notes (Signed)
Subjective:  Patient presents to the office today for follow-up evaluation of hallux abductovalgus with bunion deformity bilaterally as well as bilateral foot pain. On last office visit we discussed conservative versus surgical management of bunion deformity. Patient presents today for further discussion and follow-up evaluation of bilateral foot pain.  Patient is a LawyerCNA at [employer]    Objective/Physical Exam General: The patient is alert and oriented x3 in no acute distress.  Dermatology: Skin is warm, dry and supple bilateral lower extremities. Negative for open lesions or macerations.  Vascular: Palpable pedal pulses bilaterally. No edema or erythema noted. Capillary refill within normal limits.  Neurological: Epicritic and protective threshold grossly intact bilaterally.   Musculoskeletal Exam: Pain on palpation is noted along the posterior tibial tendons bilaterally extending distally to the level of the porta pedis. Patient also has excessive pronation of the foot during weightbearing with strain along the medial aspect of the foot and ankle.  Large bunion deformity also noted to the bilateral great toes with a hallux abductus angle greater than 30.  Radiographic Exam:  Previous x-rays taken 02/19/2016 indicate increased intermetatarsal angle greater than 15 with a hallux abductus angle greater than 30 bilateral lower extremities.  Assessment: #1 painful symptomatic bunion deformity bilateral lower extremities. More symptomatic on the left lower extremity #2 posterior tibial tendinitis left lower extremity #3 pes planus bilateral #4 pain in bilateral feet   Plan of Care:  #1 Patient was evaluated. #2 today and ankle brace was dispensed for the left posterior tibial tendinitis  #3 refill prescription for meloxicam  #4 also discussed bilateral surgery versus one foot at a time. The patient will undergo one surgery at a time. Patient wants surgery on her  left lower extremity because of his more symptomatic currently. #5 authorization for surgery was initiated today. Surgery will consist of bunionectomy with first metatarsal osteotomy left lower extremity and possible Akin bunionectomy osteotomy left lower extremity #6 return to clinic 1 week postop   [name], DPM [facility]  Dr. [name], DPM   [address]                                        [place], [place] [address]                Office [phone]  Fax [phone]

## 2016-12-28 ENCOUNTER — Encounter: Payer: Self-pay | Admitting: Podiatry

## 2016-12-28 DIAGNOSIS — M2012 Hallux valgus (acquired), left foot: Secondary | ICD-10-CM | POA: Diagnosis not present

## 2016-12-30 ENCOUNTER — Telehealth: Payer: Self-pay | Admitting: Podiatry

## 2016-12-30 NOTE — Telephone Encounter (Signed)
Patient called stating she had foot surgery in the morning with Dr. Logan BoresEvans.  She is concerned about taking opiod medicine to control her pain and says she would like an alternative medication.  I told her to take one ibuprofen 200 mg and one acetominophen instead of the opioid medicine.  Also told her to loosen the bandage.  Call in the morning if needed.   Helane GuntherGregory Ivan Lacher DPM

## 2017-01-01 ENCOUNTER — Encounter: Payer: Commercial Managed Care - PPO | Admitting: Podiatry

## 2017-01-02 NOTE — Progress Notes (Signed)
   Subjective:  Patient presents today for surgical consult for concern for right lower extremity swelling and pain which occurred approximately 4 days ago. She states that today she feels much better and she has minimal tenderness. Surgery is scheduled for Thursday, 12/24/2016.    Objective/Physical Exam General: The patient is alert and oriented x3 in no acute distress.  Dermatology: Skin is warm, dry and supple bilateral lower extremities. Negative for open lesions or macerations.  Vascular: Palpable pedal pulses bilaterally. No edema or erythema noted. Capillary refill within normal limits.  Neurological: Epicritic and protective threshold grossly intact bilaterally.   Musculoskeletal Exam: Range of motion within normal limits to all pedal and ankle joints bilateral. Muscle strength 5/5 in all groups bilateral. -8 and tenderness with calf compression.  Assessment: #1 painful symptomatic bunion deformity bilateral lower extremities more symptomatic on the left lower extremity. #2 posterior tibial tendinitis left lower extremity #3 pes planus bilateral #4 pain in bilateral feet   Plan of Care:  #1 Patient was evaluated. #2 the patient did inform me today that her mother has a history of a DVT. We're going to proceed with surgery Thursday, with protocol for DVT prophylaxis consisting of intraoperative mini dose heparin administration and Lovenox 7 days postop #3 continue taking baby aspirin to the day of surgery. #4 return to clinic 1 week postop   Felecia ShellingBrent M. Evans, DPM [facility]  Dr. Felecia ShellingBrent M. Evans, DPM    [address]                                        [place], [place] [address]                Office [phone]  Fax [phone]) [phone]

## 2017-01-05 ENCOUNTER — Telehealth: Payer: Self-pay | Admitting: *Deleted

## 2017-01-05 DIAGNOSIS — Z9889 Other specified postprocedural states: Secondary | ICD-10-CM

## 2017-01-05 DIAGNOSIS — M2012 Hallux valgus (acquired), left foot: Secondary | ICD-10-CM

## 2017-01-05 NOTE — Telephone Encounter (Signed)
Pt states she has an appt 01/08/2017 and wanted to know if she should bring her surgical shoe, and if she will able to get her foot wet in the shower, and if she was okay to use a walker. I told pt to bring her shoe but she may still be in the surgery boot, that she will probably still have a light dressing, and that the walker was great for balance when putting the surgery foot down and quickly stepping off. Pt states understanding.

## 2017-01-07 ENCOUNTER — Encounter: Payer: Commercial Managed Care - PPO | Admitting: Podiatry

## 2017-01-08 ENCOUNTER — Encounter: Payer: Commercial Managed Care - PPO | Admitting: Podiatry

## 2017-01-08 ENCOUNTER — Ambulatory Visit (INDEPENDENT_AMBULATORY_CARE_PROVIDER_SITE_OTHER): Payer: Commercial Managed Care - PPO | Admitting: Podiatry

## 2017-01-08 ENCOUNTER — Ambulatory Visit (INDEPENDENT_AMBULATORY_CARE_PROVIDER_SITE_OTHER): Payer: Commercial Managed Care - PPO

## 2017-01-08 DIAGNOSIS — M2010 Hallux valgus (acquired), unspecified foot: Secondary | ICD-10-CM

## 2017-01-08 DIAGNOSIS — M21619 Bunion of unspecified foot: Secondary | ICD-10-CM

## 2017-01-08 DIAGNOSIS — Z9889 Other specified postprocedural states: Secondary | ICD-10-CM

## 2017-01-10 NOTE — Progress Notes (Signed)
Subjective: Patient presents today status post bunionectomy to the left lower extremity. Patient states that she's doing well and has minimal pain. Date of surgery 12/31/2016.  Objective: Skin incisions are well coapted with sutures intact. No sign of infectious process. Minimal edema noted. Radiographic exam shows orthopedic hardware intact with a stable postsurgical osteotomy of the left foot bunionectomy.  Assessment: Status post bunionectomy left foot. Date of surgery 12/31/2016.  Plan of care: Today the patient was evaluated and dressings were changed. Continue to keep dressing clean dry and intact. Return to clinic in 1 week.

## 2017-01-13 ENCOUNTER — Ambulatory Visit: Payer: Commercial Managed Care - PPO | Admitting: Psychiatry

## 2017-01-14 ENCOUNTER — Encounter: Payer: Commercial Managed Care - PPO | Admitting: Podiatry

## 2017-01-15 ENCOUNTER — Ambulatory Visit (INDEPENDENT_AMBULATORY_CARE_PROVIDER_SITE_OTHER): Payer: Self-pay | Admitting: Podiatry

## 2017-01-15 ENCOUNTER — Encounter: Payer: Self-pay | Admitting: Podiatry

## 2017-01-15 DIAGNOSIS — Z9889 Other specified postprocedural states: Secondary | ICD-10-CM

## 2017-01-15 NOTE — Progress Notes (Signed)
Subjective: Patient presents today status post bunionectomy to the left lower extremity. Patient states that she's doing well and has minimal pain. Date of surgery 12/28/2016.  Objective: Skin incisions are well coapted. No sign of infectious process. Minimal edema noted.  Assessment: Status post bunionectomy left foot. Date of surgery 12/31/2016.  Plan of care: Patient can begin getting the foot wet and showering and bathing. Continue weightbearing in postoperative shoe. Today we're going to order physical therapy beginning 01/25/2017. Return to clinic in 4 weeks.

## 2017-01-15 NOTE — Telephone Encounter (Addendum)
-----   Message from Felecia ShellingBrent M Evans, DPM sent at 01/15/2017 12:30 PM EST ----- Regarding: Physical Therapy Please order physical therapy beginning on 01/25/17.  3x/week x 4 weeks.  - increase ROM 1st MPJ left foot - decrease swelling - gait training  Dx: s/p bunionectomy left foot. DOS 12/28/16  Thanks, Dr. Logan BoresEvans. Faxed required form, orders and demographics to [facility]. 01/25/2017-Pt states she has an appt with PT today, but does know how to get there. I called to give pt instructions and she said she had gotten there.

## 2017-01-19 ENCOUNTER — Ambulatory Visit (INDEPENDENT_AMBULATORY_CARE_PROVIDER_SITE_OTHER): Payer: Commercial Managed Care - PPO | Admitting: Primary Care

## 2017-01-19 ENCOUNTER — Encounter: Payer: Self-pay | Admitting: Primary Care

## 2017-01-19 VITALS — BP 128/82 | HR 66 | Temp 97.9°F | Ht 62.0 in | Wt 150.8 lb

## 2017-01-19 DIAGNOSIS — E785 Hyperlipidemia, unspecified: Secondary | ICD-10-CM | POA: Diagnosis not present

## 2017-01-19 DIAGNOSIS — F411 Generalized anxiety disorder: Secondary | ICD-10-CM | POA: Diagnosis not present

## 2017-01-19 DIAGNOSIS — Z8349 Family history of other endocrine, nutritional and metabolic diseases: Secondary | ICD-10-CM | POA: Diagnosis not present

## 2017-01-19 NOTE — Patient Instructions (Signed)
Schedule a lab only visit at your convenience. Ensure you are fasting. You may have water and black coffee.  It was a pleasure to meet you today! Please don't hesitate to call me with any questions. Welcome to [facility]!

## 2017-01-19 NOTE — Assessment & Plan Note (Signed)
Follows with psychiatry, feels well managed. Continue Effexor.

## 2017-01-19 NOTE — Progress Notes (Signed)
Subjective:    Patient ID: [name], female    DOB: 01/27/1961, 56 y.o.   MRN: [id number]  HPI  [name] is a 56 year old female who presents today to establish care and discuss the problems mentioned below. Will obtain old records. She would like blood work done today. She completed her annual physical through GYN in January 2018. Her GYN does not draw labs.  1) Anxiety and Depression: Currently managed on Effexor XR 75 mg. She is following with psychiatry and will follow up every 6 months. She feels well managed. Denies SI/HI.  2) Hyperlipidemia: Personal history. She's had no recent cholesterol check. She does have a strong family history of hypertension and hyperlipidemia. She is active and tries to eat a healthy diet.   Review of Systems  Respiratory: Negative for shortness of breath.   Cardiovascular: Negative for chest pain.  Musculoskeletal:       Recovering from recent bunionectomy.   Psychiatric/Behavioral: Negative for suicidal ideas. The patient is not nervous/anxious.        Past Medical History:  Diagnosis Date  . Anxiety   . Bipolar 1 disorder (HCC)   . Depression   . Hyperlipidemia   . Hypertension      Social History   Social History  . Marital status: Married    Spouse name: N/A  . Number of children: N/A  . Years of education: N/A   Occupational History  . Not on file.   Social History Main Topics  . Smoking status: Never Smoker  . Smokeless tobacco: Never Used  . Alcohol use No     Comment: social   . Drug use: No  . Sexual activity: No   Other Topics Concern  . Not on file   Social History Narrative   Single.   2 children.   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    Past Surgical History:  Procedure Laterality Date  . BUNIONECTOMY Left   . WISDOM TOOTH EXTRACTION      Family History  Problem Relation Age of Onset  . Arthritis Mother   . Hypertension Mother   . Hyperlipidemia Mother   .  Hypothyroidism Mother   . Heart attack Father   . Bipolar disorder Brother   . Anxiety disorder Brother   . Depression Brother   . Depression Maternal Aunt     Allergies  Allergen Reactions  . Sulfa Antibiotics     Current Outpatient Prescriptions on File Prior to Visit  Medication Sig Dispense Refill  . Biotin 1000 MCG tablet Take by mouth.    . calcium-vitamin D (OSCAL WITH D) 500-200 MG-UNIT per tablet Take 1 tablet by mouth daily. For Vitamin D replacement and mood control. 30 tablet 0  . Multiple Vitamin (MULTIVITAMIN WITH MINERALS) TABS Take 1 tablet by mouth daily. For nutritional supplementation. 30 tablet 0  . venlafaxine XR (EFFEXOR-XR) 75 MG 24 hr capsule TAKE 1 CAPSULE (75 MG TOTAL) BY MOUTH DAILY WITH BREAKFAST. 30 capsule 0   Current Facility-Administered Medications on File Prior to Visit  Medication Dose Route Frequency Provider Last Rate Last Dose  . betamethasone acetate-betamethasone sodium phosphate (CELESTONE) injection 3 mg  3 mg Intramuscular Once Felecia Shelling, DPM      . betamethasone acetate-betamethasone sodium phosphate (CELESTONE) injection 3 mg  3 mg Intramuscular Once Felecia Shelling, DPM        BP 128/82   Pulse  66   Temp 97.9 F (36.6 C) (Oral)   Ht 5\' 2"  (1.575 m)   Wt 150 lb 12.8 oz (68.4 kg)   LMP 05/28/2013   SpO2 97%   BMI 27.58 kg/m    Objective:   Physical Exam  Constitutional: She appears well-nourished.  Neck: Neck supple.  Cardiovascular: Normal rate and regular rhythm.   Pulmonary/Chest: Effort normal and breath sounds normal.  Skin: Skin is warm and dry.  Psychiatric: She has a normal mood and affect.          Assessment & Plan:

## 2017-01-19 NOTE — Assessment & Plan Note (Signed)
Following with psychiatry, feels well managed. Continue Effexor XR.

## 2017-01-19 NOTE — Assessment & Plan Note (Signed)
No recent lipid panel per patient. She works on healthy lifestyle changes overall. She will return for lab work as she is not fasting.

## 2017-01-19 NOTE — Progress Notes (Signed)
Pre visit review using our clinic review tool, if applicable. No additional management support is needed unless otherwise documented below in the visit note. 

## 2017-01-20 LAB — COMPREHENSIVE METABOLIC PANEL
ALT: 26 U/L (ref 0–35)
AST: 18 U/L (ref 0–37)
Albumin: 4.3 g/dL (ref 3.5–5.2)
Alkaline Phosphatase: 94 U/L (ref 39–117)
BUN: 16 mg/dL (ref 6–23)
CO2: 32 mEq/L (ref 19–32)
Calcium: 9.3 mg/dL (ref 8.4–10.5)
Chloride: 102 mEq/L (ref 96–112)
Creatinine, Ser: 0.81 mg/dL (ref 0.40–1.20)
GFR: 78.34 mL/min (ref >60.00)
Glucose, Bld: 91 mg/dL (ref 70–99)
Potassium: 4.2 mEq/L (ref 3.5–5.1)
Sodium: 140 mEq/L (ref 135–145)
Total Bilirubin: 0.5 mg/dL (ref 0.2–1.2)
Total Protein: 7 g/dL (ref 6.0–8.3)

## 2017-01-20 LAB — LIPID PANEL
Cholesterol: 195 mg/dL (ref 0–200)
HDL: 72.8 mg/dL (ref >39.00)
LDL Cholesterol: 110 mg/dL — ABNORMAL HIGH (ref 0–99)
NonHDL: 122.57
Total CHOL/HDL Ratio: 3
Triglycerides: 65 mg/dL (ref 0.0–149.0)
VLDL: 13 mg/dL (ref 0.0–40.0)

## 2017-01-20 LAB — TSH: TSH: 1.58 u[IU]/mL (ref 0.35–4.50)

## 2017-01-20 NOTE — Progress Notes (Signed)
DOS 02.05.2018 Bunionectomy with osteotomy left foot. Any other indicated procedure.

## 2017-01-20 NOTE — Addendum Note (Signed)
Addended by: Baldomero LamyHAVERS, Hensley Treat C on: 01/20/2017 08:57 AM   Modules accepted: Orders

## 2017-01-21 ENCOUNTER — Telehealth: Payer: Self-pay | Admitting: Primary Care

## 2017-01-21 NOTE — Telephone Encounter (Signed)
Pt returned your call.  

## 2017-01-22 NOTE — Telephone Encounter (Signed)
Spoken and notified patient of [name]'s comments. Patient verbalized understanding. 

## 2017-01-22 NOTE — Telephone Encounter (Deleted)
Message left for patient to return my call.  

## 2017-02-09 ENCOUNTER — Encounter: Payer: Self-pay | Admitting: Podiatry

## 2017-02-09 ENCOUNTER — Telehealth: Payer: Self-pay | Admitting: *Deleted

## 2017-02-09 ENCOUNTER — Encounter: Payer: Commercial Managed Care - PPO | Admitting: Podiatry

## 2017-02-09 ENCOUNTER — Ambulatory Visit (INDEPENDENT_AMBULATORY_CARE_PROVIDER_SITE_OTHER): Payer: Commercial Managed Care - PPO

## 2017-02-09 ENCOUNTER — Ambulatory Visit (INDEPENDENT_AMBULATORY_CARE_PROVIDER_SITE_OTHER): Payer: Commercial Managed Care - PPO | Admitting: Podiatry

## 2017-02-09 DIAGNOSIS — M2012 Hallux valgus (acquired), left foot: Secondary | ICD-10-CM | POA: Diagnosis not present

## 2017-02-09 DIAGNOSIS — M5416 Radiculopathy, lumbar region: Secondary | ICD-10-CM

## 2017-02-09 DIAGNOSIS — R52 Pain, unspecified: Secondary | ICD-10-CM

## 2017-02-09 DIAGNOSIS — Z9889 Other specified postprocedural states: Secondary | ICD-10-CM

## 2017-02-09 NOTE — Telephone Encounter (Addendum)
-----   Message from Felecia ShellingBrent M Evans, DPM sent at 02/09/2017  1:22 PM EDT ----- Regarding: Ortho spine specialist referral Please provide referral for patient to an ortho spine specialist.  Dx: lumbar radiculopathy and back pain x 2 weeks.   Thanks, Dr. Logan BoresEvans. Referral, clinicals and demographic faxed to [facility] [phone], fax [phone]. 02/15/2017-Left message informing pt of referral to [facility] [phone]/27/2018-Pt states she received a message yesterday with the phone number to the [facility], but they had not received a referral from our office. I left message explaining and apologizing that I had been waiting for Dr. Logan BoresEvans clinical notes, prior to faxing the referral. I told pt I faxed the referral without the notes and would fax the notes later.

## 2017-02-10 ENCOUNTER — Telehealth: Payer: Self-pay | Admitting: Podiatry

## 2017-02-10 NOTE — Telephone Encounter (Signed)
Any specifics she wants please provide.  Example:  1. No standing longer than __ minutes 2. No lifting more than __ pounds 3. Rest as needed 4. Patient to wear postop shoe as needed  Thanks, Dr. Logan BoresEvans

## 2017-02-10 NOTE — Telephone Encounter (Signed)
Pt called and needs a new note from you requarding her return to work letter.It needs to be more specific.Please call pt when ready

## 2017-02-11 ENCOUNTER — Encounter: Payer: Self-pay | Admitting: Podiatry

## 2017-02-11 ENCOUNTER — Telehealth: Payer: Self-pay | Admitting: Podiatry

## 2017-02-11 NOTE — Telephone Encounter (Signed)
Patient returned to office today after seeing Dr. Logan BoresEvans on Tuesday 02/09/17 saying that Dr, Logan BoresEvans told her that he was referring her to [facility] for a spinal xray of her lower back and hip area. Patient is wanting to know when this appointment will be scheduled and with whom?  Would like a call back on this. No notes in the chart for this date of service yet by Dr. Logan BoresEvans.  I couldn't give any details of his requests as a result. Please call her with information.

## 2017-02-12 NOTE — Telephone Encounter (Signed)
Yes, please fill in the blanks from previous message. Whatever she wants.  Thanks, Dr. Logan BoresEvans

## 2017-02-15 ENCOUNTER — Ambulatory Visit: Payer: Commercial Managed Care - PPO | Admitting: Psychiatry

## 2017-02-17 NOTE — Progress Notes (Signed)
Subjective: Patient presents today status post bunionectomy to the left lower extremity. Patient states that she's doing well and has minimal pain. Date of surgery 12/28/2016. Patient also states that recently she is been extracting significant amounts of lower back pain. Pain begins in her lower back and extends down to her lower extremities. Patient is experiencing this pain for the past 2 weeks. Patient denies any trauma or exacerbating factors.  Objective: Skin incisions are well coapted. No sign of infectious process. Minimal edema noted.  Assessment: Status post bunionectomy left foot. Date of surgery 12/31/2016. Lumbar radiculopathy  Plan of care: Patient was evaluated today. Today we are going to place a consult for an orthosis spine specialist to evaluate the back. Continue physical therapy. Return to clinic in 1 month.  [name], DPM [facility]  Dr. Felecia ShellingBrent M. Haynes, DPM    [address]                                        [place], [place] [id number]                Office [phone]  Fax [phone]

## 2017-02-17 NOTE — Telephone Encounter (Signed)
Done. Dr. Evans

## 2017-02-19 ENCOUNTER — Ambulatory Visit (INDEPENDENT_AMBULATORY_CARE_PROVIDER_SITE_OTHER): Payer: Commercial Managed Care - PPO | Admitting: Podiatry

## 2017-02-19 DIAGNOSIS — M722 Plantar fascial fibromatosis: Secondary | ICD-10-CM

## 2017-02-19 DIAGNOSIS — M79672 Pain in left foot: Secondary | ICD-10-CM

## 2017-02-19 DIAGNOSIS — M79671 Pain in right foot: Secondary | ICD-10-CM

## 2017-02-19 DIAGNOSIS — Z9889 Other specified postprocedural states: Secondary | ICD-10-CM

## 2017-02-19 MED ORDER — BETAMETHASONE SOD PHOS & ACET 6 (3-3) MG/ML IJ SUSP: 3.0000 mg | Freq: Once | INTRAMUSCULAR | Status: DC

## 2017-02-19 NOTE — Progress Notes (Signed)
   Subjective: Patient presents today for pain and tenderness in the feet bilaterally. Patient states the foot pain has been hurting for several weeks now. Patient states that it hurts in the mornings with the first steps out of bed. Patient presents today for further treatment and evaluation Patient recently had bunionectomy surgery to the left foot. Date of surgery February 2018. Patient states that her bunion does not bother her at all.  Objective: Physical Exam General: The patient is alert and oriented x3 in no acute distress.  Dermatology: Skin is warm, dry and supple bilateral lower extremities. Negative for open lesions or macerations bilateral.   Vascular: Dorsalis Pedis and Posterior Tibial pulses palpable bilateral.  Capillary fill time is immediate to all digits.  Neurological: Epicritic and protective threshold intact bilateral.   Musculoskeletal: Tenderness to palpation at the medial calcaneal tubercale and through the insertion of the plantar fascia of the bilateral feet. All other joints range of motion within normal limits bilateral. Strength 5/5 in all groups bilateral.   Assessment: #1 plantar fasciitis bilateral feet #2 pain in bilateral feet #3 status post bunionectomy surgery left foot. Date of surgery 12/31/2016. #4 lumbar radiculopathy  Plan of Care:  1. Patient evaluated. Xrays reviewed.   2. Injection of 0.5cc Celestone soluspan injected into the bilateral heels.  3. Instructed patient regarding therapies and modalities at home to alleviate symptoms.  4. Plantar fascial bands dispensed bilateral 5. Return to clinic in 4 weeks  Patient has an appointment at the [facility] to see an orthopedic spine specialist on 03/08/2017  Felecia Shelling, DPM [facility]  Dr. Felecia Shelling, DPM    [address]                                        [place], [place] [address]                Office [phone]  Fax [phone]

## 2017-02-25 NOTE — Progress Notes (Signed)
This encounter was created in error - please disregard.

## 2017-03-18 ENCOUNTER — Ambulatory Visit (INDEPENDENT_AMBULATORY_CARE_PROVIDER_SITE_OTHER): Payer: Commercial Managed Care - PPO | Admitting: Primary Care

## 2017-03-18 ENCOUNTER — Encounter: Payer: Self-pay | Admitting: Primary Care

## 2017-03-18 VITALS — BP 124/76 | HR 62 | Temp 97.8°F | Ht 62.0 in | Wt 149.8 lb

## 2017-03-18 DIAGNOSIS — R6884 Jaw pain: Secondary | ICD-10-CM

## 2017-03-18 NOTE — Patient Instructions (Signed)
Increase your Ibuprofen to 600 mg two to three times daily for jaw pain.  Purchase a night guard for your mouth to prevent any potential clenching.  Follow up with your dentist for further evaluation of the teeth on your left side and also for the jaw pain on the right.  It was a pleasure to see you today!

## 2017-03-18 NOTE — Progress Notes (Signed)
Subjective:    Patient ID: [name], female    DOB: Dec 12, 1960, 56 y.o.   MRN: [id number]  HPI  [name] is a 56 year old female who presents today with a chief complaint of jaw pain. Her pain is located to the right jaw that has been present for the past 2 months. Each time she opens her jaw she feels a crunching sound. Her pain will inhibit her from opening her mouth completely, and affects her chewing. She's been unable to chew on her left side due to dentition problems so she's been chewing mostly on her right side for several months. She denies fevers, ear pain, headaches. She doesn't think she grinds her teeth at night.  Review of Systems  Constitutional: Negative for fever.  HENT: Negative for ear pain and sore throat.        Right jaw pain and stiffness.  Respiratory: Negative for cough.        Past Medical History:  Diagnosis Date  . Anxiety   . Bipolar 1 disorder (HCC)   . Depression   . Hyperlipidemia   . Hypertension      Social History   Social History  . Marital status: Married    Spouse name: N/A  . Number of children: N/A  . Years of education: N/A   Occupational History  . Not on file.   Social History Main Topics  . Smoking status: Never Smoker  . Smokeless tobacco: Never Used  . Alcohol use No     Comment: social   . Drug use: No  . Sexual activity: No   Other Topics Concern  . Not on file   Social History Narrative   Single.   2 children.   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    Past Surgical History:  Procedure Laterality Date  . BUNIONECTOMY Left   . WISDOM TOOTH EXTRACTION      Family History  Problem Relation Age of Onset  . Arthritis Mother   . Hypertension Mother   . Hyperlipidemia Mother   . Hypothyroidism Mother   . Heart attack Father   . Bipolar disorder Brother   . Anxiety disorder Brother   . Depression Brother   . Depression Maternal Aunt     Allergies  Allergen Reactions   . Sulfa Antibiotics Rash    Current Outpatient Prescriptions on File Prior to Visit  Medication Sig Dispense Refill  . Biotin 1000 MCG tablet Take by mouth.    . calcium-vitamin D (OSCAL WITH D) 500-200 MG-UNIT per tablet Take 1 tablet by mouth daily. For Vitamin D replacement and mood control. 30 tablet 0  . meloxicam (MOBIC) 15 MG tablet Take 15 mg by mouth daily.    . Multiple Vitamin (MULTIVITAMIN WITH MINERALS) TABS Take 1 tablet by mouth daily. For nutritional supplementation. 30 tablet 0  . oxyCODONE-acetaminophen (PERCOCET/ROXICET) 5-325 MG tablet Take 1 tablet by mouth every 4 (four) hours as needed for severe pain.    Marland Kitchen venlafaxine XR (EFFEXOR-XR) 75 MG 24 hr capsule TAKE 1 CAPSULE (75 MG TOTAL) BY MOUTH DAILY WITH BREAKFAST. 30 capsule 0   Current Facility-Administered Medications on File Prior to Visit  Medication Dose Route Frequency Provider Last Rate Last Dose  . betamethasone acetate-betamethasone sodium phosphate (CELESTONE) injection 3 mg  3 mg Intramuscular Once Felecia Shelling, DPM      . betamethasone acetate-betamethasone sodium phosphate (CELESTONE) injection 3 mg  3 mg Intramuscular Once Felecia Shelling, DPM      . betamethasone acetate-betamethasone sodium phosphate (CELESTONE) injection 3 mg  3 mg Intramuscular Once Felecia Shelling, DPM        BP 124/76   Pulse 62   Temp 97.8 F (36.6 C) (Oral)   Ht  (1.575 m)   Wt 149 lb 12.8 oz (67.9 kg)   LMP 05/28/2013   SpO2 96%   BMI 27.40 kg/m    Objective:   Physical Exam  Constitutional: She appears well-nourished. She does not appear ill.  HENT:  Mouth/Throat: No oropharyngeal exudate, posterior oropharyngeal edema or posterior oropharyngeal erythema.  Decrease in ROM to jaw. TMJ non tender. No tenderness to mastoid process.   Neck: Neck supple.  Cardiovascular: Normal rate and regular rhythm.   Pulmonary/Chest: Effort normal and breath sounds normal.  Skin: Skin is warm and dry.          Assessment  & Plan:  Jaw Pain:  Also with stiffness x 2 months. Exam today without dislocation or infection. Suspect the cause to be overuse of right jaw as she cannot chew on her left. Discussed for her to follow up with her dentist to get her teeth repaired on the left side so she won't be over exerting the right. Also recommended a night guard in case she's clenching/grinding.  [name], NP

## 2017-03-18 NOTE — Progress Notes (Signed)
Pre visit review using our clinic review tool, if applicable. No additional management support is needed unless otherwise documented below in the visit note. 

## 2017-03-22 ENCOUNTER — Ambulatory Visit (INDEPENDENT_AMBULATORY_CARE_PROVIDER_SITE_OTHER): Payer: Commercial Managed Care - PPO | Admitting: Psychiatry

## 2017-03-22 ENCOUNTER — Encounter: Payer: Self-pay | Admitting: Psychiatry

## 2017-03-22 VITALS — BP 133/85 | HR 76 | Temp 97.7°F | Wt 153.8 lb

## 2017-03-22 DIAGNOSIS — F330 Major depressive disorder, recurrent, mild: Secondary | ICD-10-CM | POA: Diagnosis not present

## 2017-03-22 DIAGNOSIS — F411 Generalized anxiety disorder: Secondary | ICD-10-CM

## 2017-03-22 MED ORDER — VENLAFAXINE HCL ER 75 MG PO CP24: 75.0000 mg | ORAL_CAPSULE | Freq: Every day | ORAL | 1 refills | Status: DC

## 2017-03-22 NOTE — Progress Notes (Signed)
BH MD/PA/NP OP Progress Note  03/22/2017 1:17 PM [name]  MRN:  [id number]  Subjective:   Patient is a 56 year old female who presented for the follow-up appointment. She reported that she is to several of her appointments. She reported that she continues to take Effexor 150 mg in the morning. She reported that she did not start the Lexapro as prescribed. She wants to decrease the dose of Effexor at this appointment. She reported that she has gained weight and has stopped taking the Seroquel. She appears calm and alert during the interview. We discussed about her medications in detail. Patient appears receptive to her medications. She reported that she still works at the same place. She feels that her job is doing well and she does not have any acute distress at this time.  She denied having any suicidal homicidal ideations.     Chief Complaint:  Chief Complaint    Follow-up; Medication Refill     Visit Diagnosis:     ICD-9-CM ICD-10-CM   1. 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 Anxiety state 300.00 F41.1     Past Medical History:  Past Medical History:  Diagnosis Date  . Anxiety   . Bipolar 1 disorder (HCC)   . Depression   . Hyperlipidemia   . Hypertension     Past Surgical History:  Procedure Laterality Date  . BUNIONECTOMY Left   . WISDOM TOOTH EXTRACTION     Family History:  Family History  Problem Relation Age of Onset  . Arthritis Mother   . Hypertension Mother   . Hyperlipidemia Mother   . Hypothyroidism Mother   . Heart attack Father   . Bipolar disorder Brother   . Anxiety disorder Brother   . Depression Brother   . Depression Maternal Aunt    Social History:  Social History   Social History  . Marital status: Married    Spouse name: N/A  . Number of children: N/A  . Years of education: N/A   Social History Main Topics  . Smoking status: Never Smoker  . Smokeless tobacco: Never Used  . Alcohol use  No     Comment: social   . Drug use: No  . Sexual activity: No   Other Topics Concern  . None   Social History Narrative   Single.   2 children.   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   Additional History:  Pt stated that she works at [employer] and she is busy. She has been working full time.   Assessment:   Musculoskeletal: Strength & Muscle Tone: within normal limits Gait & Station: normal Patient leans: N/A  Psychiatric Specialty Exam: Anxiety  Symptoms include nervous/anxious behavior. Patient reports no insomnia or nausea.    Medication Refill  Pertinent negatives include no nausea.    Review of Systems  Gastrointestinal: Negative for nausea.  Psychiatric/Behavioral: Positive for depression. The patient is nervous/anxious. The patient does not have insomnia.   All other systems reviewed and are negative.   Blood pressure 133/85, pulse 76, temperature 97.7 F (36.5 C), temperature source Oral, weight 153 lb 12.8 oz (69.8 kg), last menstrual period 05/28/2013.Body mass index is 28.13 kg/m.  General Appearance: Casual  Eye Contact:  Fair  Speech:  Clear and Coherent  Volume:  Normal  Mood:  Depressed  Affect:  Congruent  Thought Process:  Coherent  Orientation:  Full (Time, Place,  and Person)  Thought Content:  WDL  Suicidal Thoughts:  No    Homicidal Thoughts:  No  Memory:  NA  Judgement:  Fair  Insight:  Fair  Psychomotor Activity:  Normal  Concentration:  Fair  Recall:  Fair  Fund of Knowledge: Fair  Language: Fair  Akathisia:  No  Handed:  Right  AIMS (if indicated):  none  Assets:  Communication Skills Desire for Improvement Physical Health Social Support  ADL's:  Intact  Cognition: WNL  Sleep:  6-7   Is the patient at risk to self?  No. Has the patient been a risk to self in the past 6 months?  No. Has the patient been a risk to self within the distant past?  No. Is the patient a risk to others?   No. Has the patient been a risk to others in the past 6 months?  No. Has the patient been a risk to others within the distant past?  No.  Current Medications: Current Outpatient Prescriptions  Medication Sig Dispense Refill  . Biotin 1000 MCG tablet Take by mouth.    . calcium-vitamin D (OSCAL WITH D) 500-200 MG-UNIT per tablet Take 1 tablet by mouth daily. For Vitamin D replacement and mood control. 30 tablet 0  . Multiple Vitamin (MULTIVITAMIN WITH MINERALS) TABS Take 1 tablet by mouth daily. For nutritional supplementation. 30 tablet 0  . venlafaxine XR (EFFEXOR XR) 75 MG 24 hr capsule Take 1 capsule (75 mg total) by mouth daily with breakfast. 30 capsule 1   No current facility-administered medications for this visit.     Medical Decision Making:  Review of Psycho-Social Stressors (1) and Review of Medication Regimen & Side Effects (2)  Treatment Plan Summary:Medication management   Depression  I will decrease venlafaxine XR 75 mg in the morning.      Follow up in 2 months  or earlier.   More than 50% of the time spent in psychoeducation, counseling and coordination of care.   This note was generated in part or whole with voice recognition software. Voice regonition is usually quite accurate but there are transcription errors that can and very often do occur. I apologize for any typographical errors that were not detected and corrected.   Brandy Hale, MD    03/22/2017, 1:17 PM

## 2017-03-23 ENCOUNTER — Ambulatory Visit (INDEPENDENT_AMBULATORY_CARE_PROVIDER_SITE_OTHER): Payer: Self-pay | Admitting: Podiatry

## 2017-03-23 ENCOUNTER — Encounter: Payer: Self-pay | Admitting: Podiatry

## 2017-03-23 DIAGNOSIS — Z9889 Other specified postprocedural states: Secondary | ICD-10-CM

## 2017-03-23 DIAGNOSIS — M722 Plantar fascial fibromatosis: Secondary | ICD-10-CM

## 2017-03-24 ENCOUNTER — Telehealth: Payer: Self-pay | Admitting: *Deleted

## 2017-03-24 DIAGNOSIS — M722 Plantar fascial fibromatosis: Secondary | ICD-10-CM

## 2017-03-24 NOTE — Telephone Encounter (Addendum)
-----   Message from Felecia Shelling, DPM sent at 03/23/2017  1:43 PM EDT ----- Regarding: Physical Therapy Please provide physical therapy left foot 3x/week x 4 weeks.  Dx: plantar fasciitis left foot  Thanks, Dr. Logan Bores. 03/24/2017-Faxed required form, demographics to [facility].

## 2017-03-24 NOTE — Progress Notes (Signed)
   Subjective: Patient presents today for follow up evaluation of bilateral plantar fasciitis. She states she is still experiencing some pain to the left heel. She reports the right heel is doing much better but is still bothersome intermittently. She states the injections and plantar fasciitis bands helped relieve some of her pain.  Patient recently had bunionectomy surgery to the left foot. Date of surgery February 2018. Patient states that her bunion does not bother her at all.  Objective: Physical Exam General: The patient is alert and oriented x3 in no acute distress.  Dermatology: Skin is warm, dry and supple bilateral lower extremities. Negative for open lesions or macerations bilateral.   Vascular: Dorsalis Pedis and Posterior Tibial pulses palpable bilateral.  Capillary fill time is immediate to all digits.  Neurological: Epicritic and protective threshold intact bilateral.   Musculoskeletal: Tenderness to palpation at the medial calcaneal tubercale and through the insertion of the plantar fascia of the left foot. All other joints range of motion within normal limits bilateral. Strength 5/5 in all groups bilateral.   Assessment: #1 plantar fasciitis left foot #2 status post bunionectomy surgery left foot. Date of surgery 12/31/2016.   Plan of Care:  1. Patient evaluated.  2. Referral for physical therapy 3. Continue wearing orthotics, brace and good shoe gear 4. Return to clinic in 8 weeks.   Felecia Shelling, DPM [facility]  Dr. Felecia Shelling, DPM    [address]                                        [place], [place] [address]                Office [phone]  Fax [phone]

## 2017-04-13 ENCOUNTER — Ambulatory Visit: Payer: Commercial Managed Care - PPO | Admitting: Podiatry

## 2017-04-28 ENCOUNTER — Other Ambulatory Visit: Payer: Self-pay | Admitting: Psychiatry

## 2017-05-18 ENCOUNTER — Ambulatory Visit: Payer: Commercial Managed Care - PPO | Admitting: Podiatry

## 2017-05-19 ENCOUNTER — Ambulatory Visit: Payer: Commercial Managed Care - PPO | Admitting: Psychiatry

## 2017-05-28 ENCOUNTER — Telehealth: Payer: Self-pay | Admitting: Primary Care

## 2017-05-28 NOTE — Telephone Encounter (Signed)
I recommend she make an appointment with her dentist as they would better able to help guide her to treatment. They also have specific xray capability to evaluate her symptoms.

## 2017-05-28 NOTE — Telephone Encounter (Signed)
Pt called concerning the TMJ she saw you for 4/26. She states it is not getting better. Can she get an x-ray here or should she make appt with dentist. She unsure if dentist will treat. Please advise.

## 2017-05-31 NOTE — Telephone Encounter (Signed)
Spoken and notified patient of [name]'s comments. Patient verbalized understanding. 

## 2017-06-11 ENCOUNTER — Encounter: Payer: Self-pay | Admitting: Primary Care

## 2017-06-11 ENCOUNTER — Ambulatory Visit (INDEPENDENT_AMBULATORY_CARE_PROVIDER_SITE_OTHER): Payer: Commercial Managed Care - PPO | Admitting: Primary Care

## 2017-06-11 VITALS — BP 120/84 | HR 79 | Temp 97.4°F | Ht 62.0 in | Wt 156.1 lb

## 2017-06-11 DIAGNOSIS — B9789 Other viral agents as the cause of diseases classified elsewhere: Secondary | ICD-10-CM

## 2017-06-11 DIAGNOSIS — J069 Acute upper respiratory infection, unspecified: Secondary | ICD-10-CM | POA: Diagnosis not present

## 2017-06-11 NOTE — Patient Instructions (Signed)
Your symptoms are representative of a viral illness which will resolve on its own over time. Our goal is to treat your symptoms in order to aid your body in the healing process and to make you more comfortable.   You may try the Coricidin for cough.  Consider Delsym DM or Mucinex DM for cough and congestion.   Ensure you are staying hydrated with water and rest.  Please call me Tuesday next week if you feel worse.  It was a pleasure to see you today!

## 2017-06-11 NOTE — Progress Notes (Signed)
Subjective:    Patient ID: [name], female    DOB: 11-24-1960, 56 y.o.   MRN: [id number]  HPI  [name] is a 56 year old female who presents today with a chief complaint of cough. She also reports chest congestion, fatigue. Her cough is mostly non productive, some clear sputum. Her symptoms began about 6-7 days ago. She denies sore throat, fevers. She's taken Nyquil at night with improvement. Overall she's feeling about the same.  Review of Systems  Constitutional: Positive for fatigue. Negative for chills and fever.  HENT: Positive for congestion. Negative for ear pain and sore throat.   Respiratory: Positive for cough.   Cardiovascular: Negative for chest pain.       Past Medical History:  Diagnosis Date  . Anxiety   . Bipolar 1 disorder (HCC)   . Depression   . Hyperlipidemia   . Hypertension      Social History   Social History  . Marital status: Married    Spouse name: N/A  . Number of children: N/A  . Years of education: N/A   Occupational History  . Not on file.   Social History Main Topics  . Smoking status: Never Smoker  . Smokeless tobacco: Never Used  . Alcohol use No     Comment: social   . Drug use: No  . Sexual activity: No   Other Topics Concern  . Not on file   Social History Narrative   Single.   2 children.   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   Enjoys going to the movies, traveling.    Past Surgical History:  Procedure Laterality Date  . BUNIONECTOMY Left   . WISDOM TOOTH EXTRACTION      Family History  Problem Relation Age of Onset  . Arthritis Mother   . Hypertension Mother   . Hyperlipidemia Mother   . Hypothyroidism Mother   . Heart attack Father   . Bipolar disorder Brother   . Anxiety disorder Brother   . Depression Brother   . Depression Maternal Aunt     Allergies  Allergen Reactions  . Sulfa Antibiotics Rash    Current Outpatient Prescriptions on File Prior to Visit  Medication Sig Dispense Refill  . Biotin  1000 MCG tablet Take by mouth.    . calcium-vitamin D (OSCAL WITH D) 500-200 MG-UNIT per tablet Take 1 tablet by mouth daily. For Vitamin D replacement and mood control. 30 tablet 0  . Multiple Vitamin (MULTIVITAMIN WITH MINERALS) TABS Take 1 tablet by mouth daily. For nutritional supplementation. 30 tablet 0  . venlafaxine XR (EFFEXOR XR) 75 MG 24 hr capsule Take 1 capsule (75 mg total) by mouth daily with breakfast. 30 capsule 1   No current facility-administered medications on file prior to visit.     BP 120/84   Pulse 79   Temp (!) 97.4 F (36.3 C) (Oral)   Ht 5\' 2"  (1.575 m)   Wt 156 lb 1.9 oz (70.8 kg)   LMP 05/28/2013   SpO2 94%   BMI 28.55 kg/m    Objective:   Physical Exam  Constitutional: She appears well-nourished.  HENT:  Right Ear: Tympanic membrane and ear canal normal.  Left Ear: Tympanic membrane and ear canal normal.  Nose: Right sinus exhibits no maxillary sinus tenderness and no frontal sinus tenderness. Left sinus exhibits no maxillary sinus tenderness and no frontal sinus tenderness.  Mouth/Throat: Oropharynx is clear and moist.  Eyes: Conjunctivae are normal.  Neck: Neck supple.  Cardiovascular: Normal rate and regular rhythm.   Pulmonary/Chest: Effort normal and breath sounds normal. She has no wheezes. She has no rales.  Lymphadenopathy:    She has no cervical adenopathy.  Skin: Skin is warm and dry.          Assessment & Plan:  URI:  Cough, congestion, fatigue x 6-7 days. Some improvement with Nyquil. Exam today with clear lungs,normal vitals, doesn't appear sickly. Suspect viral URI and will treat with supportive measures. She declines RX cough medication as Nyquil has been helpful. Discussed Mucinex, Delsym, fluids, rest. She will update if no improvement.  [name], NP

## 2017-06-14 ENCOUNTER — Telehealth: Payer: Self-pay | Admitting: *Deleted

## 2017-06-14 DIAGNOSIS — J069 Acute upper respiratory infection, unspecified: Secondary | ICD-10-CM

## 2017-06-14 MED ORDER — AZITHROMYCIN 250 MG PO TABS: ORAL_TABLET | ORAL | 0 refills | Status: DC

## 2017-06-14 NOTE — Telephone Encounter (Signed)
Start Azithromycin antibiotics. Take 2 tablets by mouth today, then 1 tablet daily for 4 additional days. This was [facility] to [facility] [address].

## 2017-06-14 NOTE — Telephone Encounter (Signed)
Pt left vm at triage stating she was seen Friday 7/20. She was advised if she did not improve over the weekend, to contact the office back. She is experiencing the same s/s as at her OV and is wanting to know how to proceed. pls advise

## 2017-06-14 NOTE — Telephone Encounter (Signed)
Spoken and notified patient of [name]'s comments. Patient verbalized understanding. 

## 2017-06-14 NOTE — Telephone Encounter (Signed)
Message left for patient to return my call.  

## 2017-06-28 ENCOUNTER — Telehealth: Payer: Self-pay

## 2017-06-28 NOTE — Telephone Encounter (Signed)
Pt left v/m; pt was seen 06/11/17; pt needs excuse for work for 06/11/17, 06/14/17,06/15/17 and 06/17/17. Pt request work excuse faxed to (did not leave a number). I left v/m requesting cb to verify dates needed for work excuse and fax #.

## 2017-07-01 NOTE — Telephone Encounter (Signed)
V/m left for pt to cb.

## 2017-07-02 NOTE — Telephone Encounter (Signed)
V/M left requesting cb.

## 2017-07-05 ENCOUNTER — Ambulatory Visit (INDEPENDENT_AMBULATORY_CARE_PROVIDER_SITE_OTHER): Payer: Commercial Managed Care - PPO | Admitting: Primary Care

## 2017-07-05 ENCOUNTER — Ambulatory Visit (INDEPENDENT_AMBULATORY_CARE_PROVIDER_SITE_OTHER)
Admission: RE | Admit: 2017-07-05 | Discharge: 2017-07-05 | Disposition: A | Discharge status: Home / Self Care | Payer: Commercial Managed Care - PPO | Source: Ambulatory Visit | Attending: Primary Care | Admitting: Primary Care

## 2017-07-05 ENCOUNTER — Encounter: Payer: Self-pay | Admitting: Primary Care

## 2017-07-05 VITALS — BP 124/80 | HR 65 | Temp 98.4°F | Ht 62.0 in | Wt 157.8 lb

## 2017-07-05 DIAGNOSIS — R05 Cough: Secondary | ICD-10-CM

## 2017-07-05 DIAGNOSIS — R053 Chronic cough: Secondary | ICD-10-CM

## 2017-07-05 NOTE — Patient Instructions (Signed)
Complete xray(s) prior to leaving today. I will notify you of your results once received.  Start Allegra, Claritin, or Zyrtec nightly for likely allergy causes of cough.  It was a pleasure to see you today!

## 2017-07-05 NOTE — Progress Notes (Signed)
Subjective:    Patient ID: [name], female    DOB: 11-Jul-1961, 56 y.o.   MRN: [id number]  HPI  [name] is a 56 year old female who presents today with a chief complaint of cough. History of cough and was last treated with antibiotics in late July 2018. Her cough began in early/mid July 2018. She never felt improved after treatment with Azithromycin. Her cough is mildly productive with clear sputum; post nasal drip, nasal congestion (improved after getting out of bed). She denies history of asthma, allergies, GERD.   She's taken Mucinex and OTC cough syrup without much improvement. Overall her cough has improved and has nearly resolved. She is needing a work note to cover from her last visit on these dates: July 20,23,24, 26th. She is requesting a chest xray today given the chronicity of her symptoms. She has never used tobacco products.  Review of Systems  Constitutional: Negative for fever.  HENT: Positive for congestion and postnasal drip.   Respiratory: Positive for cough. Negative for shortness of breath.   Cardiovascular: Negative for chest pain.       Past Medical History:  Diagnosis Date  . Anxiety   . Bipolar 1 disorder (HCC)   . Depression   . Hyperlipidemia   . Hypertension      Social History   Social History  . Marital status: Married    Spouse name: N/A  . Number of children: N/A  . Years of education: N/A   Occupational History  . Not on file.   Social History Main Topics  . Smoking status: Never Smoker  . Smokeless tobacco: Never Used  . Alcohol use No     Comment: social   . Drug use: No  . Sexual activity: No   Other Topics Concern  . Not on file   Social History Narrative   Single.   2 children.   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    Past Surgical History:  Procedure Laterality Date  . BUNIONECTOMY Left   . WISDOM TOOTH EXTRACTION      Family History  Problem Relation Age of Onset  .  Arthritis Mother   . Hypertension Mother   . Hyperlipidemia Mother   . Hypothyroidism Mother   . Heart attack Father   . Bipolar disorder Brother   . Anxiety disorder Brother   . Depression Brother   . Depression Maternal Aunt     Allergies  Allergen Reactions  . Sulfa Antibiotics Rash    Current Outpatient Prescriptions on File Prior to Visit  Medication Sig Dispense Refill  . Biotin 1000 MCG tablet Take by mouth.    . calcium-vitamin D (OSCAL WITH D) 500-200 MG-UNIT per tablet Take 1 tablet by mouth daily. For Vitamin D replacement and mood control. 30 tablet 0  . Multiple Vitamin (MULTIVITAMIN WITH MINERALS) TABS Take 1 tablet by mouth daily. For nutritional supplementation. 30 tablet 0   No current facility-administered medications on file prior to visit.     BP 124/80   Pulse 65   Temp 98.4 F (36.9 C) (Oral)   Ht 5\' 2"  (1.575 m)   Wt 157 lb 12.8 oz (71.6 kg)   LMP 05/28/2013   SpO2 96%   BMI 28.86 kg/m    Objective:   Physical Exam  Constitutional: She appears well-nourished.  HENT:  Right Ear: Tympanic membrane and ear canal normal.  Left Ear: Tympanic membrane  and ear canal normal.  Nose: Right sinus exhibits no maxillary sinus tenderness and no frontal sinus tenderness. Left sinus exhibits no maxillary sinus tenderness and no frontal sinus tenderness.  Mouth/Throat: Oropharynx is clear and moist.  Eyes: Conjunctivae are normal.  Neck: Neck supple.  Cardiovascular: Normal rate and regular rhythm.   Pulmonary/Chest: Effort normal and breath sounds normal. She has no wheezes. She has no rales.  Lymphadenopathy:    She has no cervical adenopathy.  Skin: Skin is warm and dry.          Assessment & Plan:  Chronic Cough:  Since early/mid July 2018. No improvement with antibiotics in late July 2018. Suspect allergy as cause for cough. Could be silent reflux. Exam today unremarkable. Discussed that chest xray will likely be normal given clear lung  sounds, she'd like to have this done despite her normal exam. Xray pending. Recommended Zyrtec nightly for likely allergies. She verbalized understanding.  Morrie Sheldonlark,Jannette Cotham Kendal, NP

## 2017-07-14 NOTE — Telephone Encounter (Signed)
Letter done 07/05/17.

## 2017-08-06 ENCOUNTER — Other Ambulatory Visit: Payer: Self-pay | Admitting: Primary Care

## 2017-08-06 DIAGNOSIS — E785 Hyperlipidemia, unspecified: Secondary | ICD-10-CM

## 2017-08-10 ENCOUNTER — Other Ambulatory Visit: Payer: Self-pay

## 2017-08-12 ENCOUNTER — Ambulatory Visit (INDEPENDENT_AMBULATORY_CARE_PROVIDER_SITE_OTHER): Payer: Commercial Managed Care - PPO | Admitting: Primary Care

## 2017-08-12 ENCOUNTER — Encounter: Payer: Self-pay | Admitting: Primary Care

## 2017-08-12 VITALS — BP 122/78 | HR 65 | Temp 98.6°F | Ht 62.0 in | Wt 156.0 lb

## 2017-08-12 DIAGNOSIS — Z23 Encounter for immunization: Secondary | ICD-10-CM

## 2017-08-12 DIAGNOSIS — F411 Generalized anxiety disorder: Secondary | ICD-10-CM

## 2017-08-12 DIAGNOSIS — Z0000 Encounter for general adult medical examination without abnormal findings: Secondary | ICD-10-CM

## 2017-08-12 DIAGNOSIS — E785 Hyperlipidemia, unspecified: Secondary | ICD-10-CM

## 2017-08-12 LAB — COMPREHENSIVE METABOLIC PANEL
ALT: 20 U/L (ref 0–35)
AST: 23 U/L (ref 0–37)
Albumin: 4.3 g/dL (ref 3.5–5.2)
Alkaline Phosphatase: 85 U/L (ref 39–117)
BUN: 17 mg/dL (ref 6–23)
CO2: 33 mEq/L — ABNORMAL HIGH (ref 19–32)
Calcium: 9.3 mg/dL (ref 8.4–10.5)
Chloride: 101 mEq/L (ref 96–112)
Creatinine, Ser: 0.82 mg/dL (ref 0.40–1.20)
GFR: 77.07 mL/min (ref >60.00)
Glucose, Bld: 89 mg/dL (ref 70–99)
Potassium: 4.2 mEq/L (ref 3.5–5.1)
Sodium: 140 mEq/L (ref 135–145)
Total Bilirubin: 0.4 mg/dL (ref 0.2–1.2)
Total Protein: 7 g/dL (ref 6.0–8.3)

## 2017-08-12 LAB — LIPID PANEL
Cholesterol: 183 mg/dL (ref 0–200)
HDL: 71.7 mg/dL (ref >39.00)
LDL Cholesterol: 94 mg/dL (ref 0–99)
NonHDL: 111.19
Total CHOL/HDL Ratio: 3
Triglycerides: 85 mg/dL (ref 0.0–149.0)
VLDL: 17 mg/dL (ref 0.0–40.0)

## 2017-08-12 NOTE — Patient Instructions (Signed)
Schedule a lab only appointment to return fasting for your labs.  Start exercising. You should be getting 150 minutes of moderate intensity exercise weekly.  Increase vegetables, fruit, whole grains.  Ensure you are consuming 64 ounces of water daily.  Follow up in 1 year for your annual exam or sooner if needed.  It was a pleasure to see you today!

## 2017-08-12 NOTE — Addendum Note (Signed)
Addended by: Tawnya Crook on: 08/12/2017 11:28 AM   Modules accepted: Orders

## 2017-08-12 NOTE — Addendum Note (Signed)
Addended by: Gregery Na on: 08/12/2017 11:08 AM   Modules accepted: Orders

## 2017-08-12 NOTE — Assessment & Plan Note (Signed)
Doing well on current regimen, following with psychiatry. 

## 2017-08-12 NOTE — Assessment & Plan Note (Signed)
Lipid panel pending. Discussed the importance of a healthy diet and regular exercise in order for weight loss, and to reduce the risk of other medical problems.  

## 2017-08-12 NOTE — Progress Notes (Signed)
Subjective:    Patient ID: [name], female    DOB: [birth year], 55 y.o.   MRN: [id number]  HPI  [name] is a 56 year old female who presents today for complete physical.  Immunizations: -Tetanus: Unsure, thinks it's been over 10 years.  -Influenza: Get's at work.   Diet: She endorses a fair diet. Breakfast: Oatmeal with nuts/fruit Lunch: Smoothie Dinner: Salad Snacks: Nuts, popcorn Desserts: 4 times weekly Beverages: Water, coffee, occasional soda  Exercise: She's not currently exercising due to chronic foot pain Eye exam: Completed in May 2018 Dental exam: Completes annually Colonoscopy: Completed in 2015 Pap Smear: Completed in 2017, Following with GYN. Mammogram: Completed in January 2018   Review of Systems  Constitutional: Negative for unexpected weight change.  HENT: Negative for rhinorrhea.   Respiratory: Negative for cough and shortness of breath.   Cardiovascular: Negative for chest pain.  Gastrointestinal: Negative for constipation and diarrhea.  Genitourinary: Negative for difficulty urinating and menstrual problem.  Musculoskeletal: Negative for arthralgias and myalgias.  Skin: Negative for rash.  Allergic/Immunologic: Negative for environmental allergies.  Neurological: Negative for dizziness, numbness and headaches.  Psychiatric/Behavioral:       Following with psychiatry, feels well managed.       Past Medical History:  Diagnosis Date  . Anxiety   . Bipolar 1 disorder (HCC)   . Depression   . Hyperlipidemia   . Hypertension      Social History   Social History  . Marital status: Married    Spouse name: N/A  . Number of children: N/A  . Years of education: N/A   Occupational History  . Not on file.   Social History Main Topics  . Smoking status: Never Smoker  . Smokeless tobacco: Never Used  . Alcohol use No     Comment: social   . Drug use: No  . Sexual activity: No   Other Topics Concern  . Not on file   Social  History Narrative   Single.   2 children.   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    Past Surgical History:  Procedure Laterality Date  . BUNIONECTOMY Left   . WISDOM TOOTH EXTRACTION      Family History  Problem Relation Age of Onset  . Arthritis Mother   . Hypertension Mother   . Hyperlipidemia Mother   . Hypothyroidism Mother   . Heart attack Father   . Bipolar disorder Brother   . Anxiety disorder Brother   . Depression Brother   . Depression Maternal Aunt     Allergies  Allergen Reactions  . Sulfa Antibiotics Rash    Current Outpatient Prescriptions on File Prior to Visit  Medication Sig Dispense Refill  . Biotin 1000 MCG tablet Take by mouth.    . calcium-vitamin D (OSCAL WITH D) 500-200 MG-UNIT per tablet Take 1 tablet by mouth daily. For Vitamin D replacement and mood control. 30 tablet 0  . Multiple Vitamin (MULTIVITAMIN WITH MINERALS) TABS Take 1 tablet by mouth daily. For nutritional supplementation. 30 tablet 0  . venlafaxine XR (EFFEXOR-XR) 150 MG 24 hr capsule Take 150 mg by mouth daily with breakfast.     No current facility-administered medications on file prior to visit.     BP 122/78   Pulse 65   Temp 98.6 F (37 C) (Oral)   Ht <SARAIH LORTONG>  (1.575 m)   Wt 156 lb (70.8 kg)   LMP  05/28/2013   SpO2 97%   BMI 28.53 kg/m    Objective:   Physical Exam  Constitutional: She is oriented to person, place, and time. She appears well-nourished.  HENT:  Right Ear: Tympanic membrane and ear canal normal.  Left Ear: Tympanic membrane and ear canal normal.  Nose: Nose normal.  Mouth/Throat: Oropharynx is clear and moist.  Eyes: Pupils are equal, round, and reactive to light. Conjunctivae and EOM are normal.  Neck: Neck supple. No thyromegaly present.  Cardiovascular: Normal rate and regular rhythm.   No murmur heard. Pulmonary/Chest: Effort normal and breath sounds normal. She has no rales.  Abdominal: Soft. Bowel sounds are  normal. There is no tenderness.  Musculoskeletal: Normal range of motion.  Lymphadenopathy:    She has no cervical adenopathy.  Neurological: She is alert and oriented to person, place, and time. She has normal reflexes. No cranial nerve deficit.  Skin: Skin is warm and dry. No rash noted.  Psychiatric: She has a normal mood and affect.          Assessment & Plan:

## 2017-08-12 NOTE — Assessment & Plan Note (Signed)
Td due, provided today. Will get influenza vaccination through work.  Pap and mammogram UTD. Colonoscopy UTD. Discussed the importance of a healthy diet and regular exercise in order for weight loss, and to reduce the risk of other medical problems. Exam unremarkable. Labs pending, she will return when fasting. Follow up in 1 year.

## 2017-08-13 ENCOUNTER — Other Ambulatory Visit: Payer: Self-pay

## 2017-08-20 ENCOUNTER — Telehealth: Payer: Self-pay | Admitting: Primary Care

## 2017-08-20 NOTE — Telephone Encounter (Signed)
Best number  [phone]  Pt dropped off proof of physical form In kates in box

## 2017-08-20 NOTE — Telephone Encounter (Signed)
Form is ready for pick up. Placed in Chan's inbox.

## 2017-08-23 ENCOUNTER — Other Ambulatory Visit: Payer: Self-pay | Admitting: Psychiatry

## 2017-08-23 NOTE — Telephone Encounter (Signed)
Pt not seen since April. Med refilled x 15 days. Please make appt.

## 2017-08-23 NOTE — Telephone Encounter (Signed)
Spoken to patient and she was notified that form is ready to pick up. Left in the front office.

## 2017-09-06 ENCOUNTER — Other Ambulatory Visit (HOSPITAL_COMMUNITY): Payer: Self-pay | Admitting: Psychiatry

## 2017-09-06 MED ORDER — VENLAFAXINE HCL ER 75 MG PO CP24: 75.0000 mg | ORAL_CAPSULE | Freq: Every day | ORAL | 0 refills | Status: DC

## 2017-09-06 NOTE — Telephone Encounter (Signed)
pt called states she needs enough medication to do until she see dr. Garnetta Buddy on  10-11-17 pt last seen on  03-22-17   Disp Refills Start End   venlafaxine XR (EFFEXOR-XR) 150 MG 24 hr capsule 15 capsule 0 08/23/2017    Sig - Route: Take 1 capsule (150 mg total) by mouth every morning. - Oral   Sent to pharmacy as: venlafaxine XR (EFFEXOR-XR) 150 MG 24 hr capsule   E-Prescribing Status: Receipt confirmed by pharmacy (08/23/2017 11:58 AM EDT)

## 2017-09-06 NOTE — Telephone Encounter (Signed)
Ordered for 45 days. If she wants 90 days, it needs to be ordered from Dr. Garnetta Buddy.

## 2017-09-07 ENCOUNTER — Encounter: Payer: Self-pay | Admitting: Podiatry

## 2017-09-23 ENCOUNTER — Other Ambulatory Visit: Payer: Self-pay | Admitting: Psychiatry

## 2017-09-27 ENCOUNTER — Ambulatory Visit (INDEPENDENT_AMBULATORY_CARE_PROVIDER_SITE_OTHER): Payer: Commercial Managed Care - PPO | Admitting: Psychiatry

## 2017-09-27 ENCOUNTER — Encounter: Payer: Self-pay | Admitting: Psychiatry

## 2017-09-27 VITALS — BP 134/81 | HR 76 | Temp 99.6°F | Wt 160.6 lb

## 2017-09-27 DIAGNOSIS — F330 Major depressive disorder, recurrent, mild: Secondary | ICD-10-CM

## 2017-09-27 DIAGNOSIS — B977 Papillomavirus as the cause of diseases classified elsewhere: Secondary | ICD-10-CM | POA: Insufficient documentation

## 2017-09-27 DIAGNOSIS — F411 Generalized anxiety disorder: Secondary | ICD-10-CM | POA: Diagnosis not present

## 2017-09-27 DIAGNOSIS — R5383 Other fatigue: Secondary | ICD-10-CM | POA: Insufficient documentation

## 2017-09-27 MED ORDER — VENLAFAXINE HCL ER 150 MG PO CP24: 150.0000 mg | ORAL_CAPSULE | Freq: Every day | ORAL | 2 refills | Status: DC

## 2017-09-27 NOTE — Progress Notes (Signed)
BH MD Progress Note  09/27/2017 11:30 AM [name]  MRN:  [id number]  Chief Complaint: " I am here for my medications."  HPI: [name] is a 56 year old Caucasian female, single, employed, lives in [place], has a history of depression and anxiety, who is here for a follow-up visit.  [name] used to follow up with Dr.Faheem in the past.  This is [name]'s first visit with Clinical research associate.  [name] today reports that she is currently back on her Effexor XR 150 mg.  She tried reducing the dose of Effexor but it did not go so well.  She reports that her symptoms are currently stable on the current dose.  She reports her anxiety is improved.  She denies any anxiety attacks.  She denies any sadness, crying spells, sleep issues or appetite changes.  She denies any suicidal thoughts.  She denies any perceptual disturbances.  She is compliant on her medications.  She denies any ADRs to her medication.  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  She continues to have a good relationship with her boyfriend.  She reports her family and her friends keep her busy.  She denies any current stressors that significant.  She reports that she would like to taper off her Effexor at some point but not right now.  Visit Diagnosis:    ICD-10-CM   1. MDD (major depressive disorder), recurrent episode, mild (HCC) F33.0 venlafaxine XR (EFFEXOR-XR) 150 MG 24 hr capsule  2. GAD (generalized anxiety disorder) F41.1     Past Psychiatric History: Past history of depression and anxiety.  She reports 3 inpatient behavioral health admissions at [facility], [place] .  She reports at least 2 suicide attempts in the past, by overdose, slit her wrist.   Past Medical History:  Past Medical History:  Diagnosis Date  . Anxiety   . Bipolar 1 disorder (HCC)   . Depression   . Hyperlipidemia   . Hypertension     Past Surgical History:  Procedure Laterality Date  . BUNIONECTOMY Left   . WISDOM TOOTH EXTRACTION      Family  Psychiatric History: Her brother has bipolar disorder, Maternal aunt - depression .  Denies any substance abuse problems, suicide in her family.  Family History:  Family History  Problem Relation Age of Onset  . Arthritis Mother   . Hypertension Mother   . Hyperlipidemia Mother   . Hypothyroidism Mother   . Heart attack Father   . Bipolar disorder Brother   . Anxiety disorder Brother   . Depression Brother   . Depression Maternal Aunt     Social History: She is divorced.  She is currently in a relationship with her boyfriend.  She has 2 children who are adults.  She is employed as a Research scientist (life sciences).  She lives with her mother in [place]. Social History   Socioeconomic History  . Marital status: Divorced    Spouse name: None  . Number of children: 2  . Years of education: None  . Highest education level: Some college, no degree  Social Needs  . Financial resource strain: Not hard at all  . Food insecurity - worry: Never true  . Food insecurity - inability: Never true  . Transportation needs - medical: No  . Transportation needs - non-medical: No  Occupational History  . Occupation: [employer]    Comment: full time  Tobacco Use  . Smoking status: Never Smoker  . Smokeless tobacco: Never Used  Substance  and Sexual Activity  . Alcohol use: No    Comment: social   . Drug use: No  . Sexual activity: No  Other Topics Concern  . None  Social History Narrative   Single.   2 children.   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    Allergies:  Allergies  Allergen Reactions  . Sulfa Antibiotics Rash    Metabolic Disorder Labs: No results found for: HGBA1C, MPG No results found for: PROLACTIN Lab Results  Component Value Date   CHOL 183 08/12/2017   TRIG 85.0 08/12/2017   HDL 71.70 08/12/2017   CHOLHDL 3 08/12/2017   VLDL 17.0 08/12/2017   LDLCALC 94 08/12/2017   LDLCALC 110 (H) 01/20/2017   Lab Results  Component Value Date    TSH 1.58 01/20/2017   TSH 1.588 08/19/2012    Therapeutic Level Labs: No results found for: LITHIUM Lab Results  Component Value Date   VALPROATE 74 02/10/2012   VALPROATE 166 (H) 02/09/2012   No components found for:  CBMZ  Current Medications: Current Outpatient Medications  Medication Sig Dispense Refill  . Biotin 1000 MCG tablet Take by mouth.    . calcium-vitamin D (OSCAL WITH D) 500-200 MG-UNIT per tablet Take 1 tablet by mouth daily. For Vitamin D replacement and mood control. 30 tablet 0  . Multiple Vitamin (MULTIVITAMIN WITH MINERALS) TABS Take 1 tablet by mouth daily. For nutritional supplementation. 30 tablet 0  . venlafaxine XR (EFFEXOR-XR) 150 MG 24 hr capsule Take 1 capsule (150 mg total) daily with breakfast by mouth. 30 capsule 2   No current facility-administered medications for this visit.      Musculoskeletal: Strength & Muscle Tone: within normal limits Gait & Station: normal Patient leans: N/A  Psychiatric Specialty Exam: Review of Systems  Psychiatric/Behavioral: Negative for depression, hallucinations, substance abuse and suicidal ideas. The patient is not nervous/anxious and does not have insomnia.   All other systems reviewed and are negative.   Blood pressure 134/81, pulse 76, temperature 99.6 F (37.6 C), temperature source Oral, weight 160 lb 9.6 oz (72.8 kg), last menstrual period 05/28/2013.Body mass index is 29.37 kg/m.  General Appearance: Casual  Eye Contact:  Fair  Speech:  Normal Rate  Volume:  Normal  Mood:  Euthymic  Affect:  Congruent  Thought Process:  Goal Directed and Descriptions of Associations: Intact  Orientation:  Full (Time, Place, and Person)  Thought Content: Logical   Suicidal Thoughts:  No  Homicidal Thoughts:  No  Memory:  Immediate;   Fair Recent;   Fair Remote;   Fair  Judgement:  Fair  Insight:  Fair  Psychomotor Activity:  Normal  Concentration:  Concentration: Fair and Attention Span: Fair  Recall:  [employer] of Knowledge: Fair  Language: Fair  Akathisia:  No  Handed:  Right  AIMS (if indicated): NA  Assets:  Communication Skills Desire for Improvement Physical Health Social Support Talents/Skills Transportation Vocational/Educational  ADL's:  Intact  Cognition: WNL  Sleep:  Fair     Assessment and Plan: [name] is a 56 year old Caucasian female who has a history of depression and anxiety, presented for a follow-up visit.  [name] is currently doing well on her current medication Effexor XR 150 mg.  [name] denies any suicidal thoughts.  [name] denies any ADRs.  [name] has good social support, is employed.  She continues to be a good candidate for outpatient treatment.  Plan For depression Continue  Effexor XR 150 mg p.o. Daily.  For anxiety Effexor XR 150 mg p.o. Daily.  Continue leisure activities, exercise.  I have reviewed medical records in EHR, previous notes from Dr.Faheem.  Follow-up in 2-3 months or sooner if needed.   More than 50 % of the time was spent for psychoeducation and supportive psychotherapy and care coordination.  This note was generated in part or whole with voice recognition software. Voice recognition is usually quite accurate but there are transcription errors that can and very often do occur. I apologize for any typographical errors that were not detected and corrected.       Jomarie LongsSaramma Messiah Ahr, MD 09/27/2017, 11:30 AM

## 2017-10-04 ENCOUNTER — Other Ambulatory Visit: Payer: Self-pay | Admitting: Orthopedic Surgery

## 2017-10-04 DIAGNOSIS — M4317 Spondylolisthesis, lumbosacral region: Secondary | ICD-10-CM

## 2017-10-11 ENCOUNTER — Ambulatory Visit
Admission: RE | Admit: 2017-10-11 | Discharge: 2017-10-11 | Disposition: A | Discharge status: Home / Self Care | Payer: Commercial Managed Care - PPO | Source: Ambulatory Visit | Attending: Orthopedic Surgery | Admitting: Orthopedic Surgery

## 2017-10-11 ENCOUNTER — Ambulatory Visit: Payer: Commercial Managed Care - PPO | Admitting: Psychiatry

## 2017-10-11 DIAGNOSIS — M4317 Spondylolisthesis, lumbosacral region: Secondary | ICD-10-CM | POA: Diagnosis present

## 2017-10-11 DIAGNOSIS — M48061 Spinal stenosis, lumbar region without neurogenic claudication: Secondary | ICD-10-CM | POA: Insufficient documentation

## 2017-10-26 ENCOUNTER — Encounter: Payer: Self-pay | Admitting: Primary Care

## 2017-10-26 ENCOUNTER — Ambulatory Visit (INDEPENDENT_AMBULATORY_CARE_PROVIDER_SITE_OTHER): Payer: Commercial Managed Care - PPO | Admitting: Primary Care

## 2017-10-26 DIAGNOSIS — I10 Essential (primary) hypertension: Secondary | ICD-10-CM | POA: Diagnosis not present

## 2017-10-26 NOTE — Patient Instructions (Signed)
Check your blood pressure daily, around the same time of day, for the next 2 weeks.  Ensure that you have rested for 30 minutes prior to checking your blood pressure. Record your readings and send them to me on My Chart.  Start exercising. You should be getting 150 minutes of moderate intensity exercise weekly.  It's important to improve your diet by reducing consumption of fast food, fried food, processed snack foods, sugary drinks. Increase consumption of fresh vegetables and fruits, whole grains, water.  Ensure you are drinking 64 ounces of water daily.  Work on stress reduction as discussed.  It was a pleasure to see you today!   DASH Eating Plan DASH stands for "Dietary Approaches to Stop Hypertension." The DASH eating plan is a healthy eating plan that has been shown to reduce high blood pressure (hypertension). It may also reduce your risk for type 2 diabetes, heart disease, and stroke. The DASH eating plan may also help with weight loss. What are tips for following this plan? General guidelines  Avoid eating more than 2,300 mg (milligrams) of salt (sodium) a day. If you have hypertension, you may need to reduce your sodium intake to 1,500 mg a day.  Limit alcohol intake to no more than 1 drink a day for nonpregnant women and 2 drinks a day for men. One drink equals 12 oz of beer, 5 oz of wine, or 1 oz of hard liquor.  Work with your health care provider to maintain a healthy body weight or to lose weight. Ask what an ideal weight is for you.  Get at least 30 minutes of exercise that causes your heart to beat faster (aerobic exercise) most days of the week. Activities may include walking, swimming, or biking.  Work with your health care provider or diet and nutrition specialist (dietitian) to adjust your eating plan to your individual calorie needs. Reading food labels  Check food labels for the amount of sodium per serving. Choose foods with less than 5 percent of the Daily  Value of sodium. Generally, foods with less than 300 mg of sodium per serving fit into this eating plan.  To find whole grains, look for the word "whole" as the first word in the ingredient list. Shopping  Buy products labeled as "low-sodium" or "no salt added."  Buy fresh foods. Avoid canned foods and premade or frozen meals. Cooking  Avoid adding salt when cooking. Use salt-free seasonings or herbs instead of table salt or sea salt. Check with your health care provider or pharmacist before using salt substitutes.  Do not fry foods. Cook foods using healthy methods such as baking, boiling, grilling, and broiling instead.  Cook with heart-healthy oils, such as olive, canola, soybean, or sunflower oil. Meal planning   Eat a balanced diet that includes: ? 5 or more servings of fruits and vegetables each day. At each meal, try to fill half of your plate with fruits and vegetables. ? Up to 6-8 servings of whole grains each day. ? Less than 6 oz of lean meat, poultry, or fish each day. A 3-oz serving of meat is about the same size as a deck of cards. One egg equals 1 oz. ? 2 servings of low-fat dairy each day. ? A serving of nuts, seeds, or beans 5 times each week. ? Heart-healthy fats. Healthy fats called Omega-3 fatty acids are found in foods such as flaxseeds and coldwater fish, like sardines, salmon, and mackerel.  Limit how much you eat of the  following: ? Canned or prepackaged foods. ? Food that is high in trans fat, such as fried foods. ? Food that is high in saturated fat, such as fatty meat. ? Sweets, desserts, sugary drinks, and other foods with added sugar. ? Full-fat dairy products.  Do not salt foods before eating.  Try to eat at least 2 vegetarian meals each week.  Eat more home-cooked food and less restaurant, buffet, and fast food.  When eating at a restaurant, ask that your food be prepared with less salt or no salt, if possible. What foods are recommended? The  items listed may not be a complete list. Talk with your dietitian about what dietary choices are best for you. Grains Whole-grain or whole-wheat bread. Whole-grain or whole-wheat pasta. Brown rice. [place]. Bulgur. Whole-grain and low-sodium cereals. Pita bread. Low-fat, low-sodium crackers. Whole-wheat flour tortillas. Vegetables Fresh or frozen vegetables (raw, steamed, roasted, or grilled). Low-sodium or reduced-sodium tomato and vegetable juice. Low-sodium or reduced-sodium tomato sauce and tomato paste. Low-sodium or reduced-sodium canned vegetables. Fruits All fresh, dried, or frozen fruit. Canned fruit in natural juice (without added sugar). Meat and other protein foods Skinless chicken or [place]. Ground chicken or [place]. Pork with fat trimmed off. Fish and seafood. Egg whites. Dried beans, peas, or lentils. Unsalted nuts, nut butters, and seeds. Unsalted canned beans. Lean cuts of beef with fat trimmed off. Low-sodium, lean deli meat. Dairy Low-fat (1%) or fat-free (skim) milk. Fat-free, low-fat, or reduced-fat cheeses. Nonfat, low-sodium ricotta or cottage cheese. Low-fat or nonfat yogurt. Low-fat, low-sodium cheese. Fats and oils Soft margarine without trans fats. Vegetable oil. Low-fat, reduced-fat, or light mayonnaise and salad dressings (reduced-sodium). Canola, safflower, olive, soybean, and sunflower oils. Avocado. Seasoning and other foods Herbs. Spices. Seasoning mixes without salt. Unsalted popcorn and pretzels. Fat-free sweets. What foods are not recommended? The items listed may not be a complete list. Talk with your dietitian about what dietary choices are best for you. Grains Baked goods made with fat, such as croissants, muffins, or some breads. Dry pasta or rice meal packs. Vegetables Creamed or fried vegetables. Vegetables in a cheese sauce. Regular canned vegetables (not low-sodium or reduced-sodium). Regular canned tomato sauce and paste (not low-sodium or  reduced-sodium). Regular tomato and vegetable juice (not low-sodium or reduced-sodium). Angie Fava. Olives. Fruits Canned fruit in a light or heavy syrup. Fried fruit. Fruit in cream or butter sauce. Meat and other protein foods Fatty cuts of meat. Ribs. Fried meat. Berniece Salines. Sausage. Bologna and other processed lunch meats. Salami. Fatback. Hotdogs. Bratwurst. Salted nuts and seeds. Canned beans with added salt. Canned or smoked fish. Whole eggs or egg yolks. Chicken or [place] with skin. Dairy Whole or 2% milk, cream, and half-and-half. Whole or full-fat cream cheese. Whole-fat or sweetened yogurt. Full-fat cheese. Nondairy creamers. Whipped toppings. Processed cheese and cheese spreads. Fats and oils Butter. Stick margarine. Lard. Shortening. Ghee. Bacon fat. Tropical oils, such as coconut, palm kernel, or palm oil. Seasoning and other foods Salted popcorn and pretzels. Onion salt, garlic salt, seasoned salt, table salt, and sea salt. Worcestershire sauce. Tartar sauce. Barbecue sauce. Teriyaki sauce. Soy sauce, including reduced-sodium. Steak sauce. Canned and packaged gravies. Fish sauce. Oyster sauce. Cocktail sauce. Horseradish that you find on the shelf. Ketchup. Mustard. Meat flavorings and tenderizers. Bouillon cubes. Hot sauce and Tabasco sauce. Premade or packaged marinades. Premade or packaged taco seasonings. Relishes. Regular salad dressings. Where to find more information:  National Heart, Lung, and Black: https://wilson-eaton.com/  American Heart Association: www.heart.org  Summary  The DASH eating plan is a healthy eating plan that has been shown to reduce high blood pressure (hypertension). It may also reduce your risk for type 2 diabetes, heart disease, and stroke.  With the DASH eating plan, you should limit salt (sodium) intake to 2,300 mg a day. If you have hypertension, you may need to reduce your sodium intake to 1,500 mg a day.  When on the DASH eating plan, aim to eat more  fresh fruits and vegetables, whole grains, lean proteins, low-fat dairy, and heart-healthy fats.  Work with your health care provider or diet and nutrition specialist (dietitian) to adjust your eating plan to your individual calorie needs. This information is not intended to replace advice given to you by your health care provider. Make sure you discuss any questions you have with your health care provider. Document Released: 10/29/2011 Document Revised: 11/02/2016 Document Reviewed: 11/02/2016 Elsevier Interactive Patient Education  2017 [employer]

## 2017-10-26 NOTE — Assessment & Plan Note (Signed)
History of in prior years only when taking Addereall, no recent medication use.   Suspect elevated readings to be secondary to stress/anxiety given normal readings on prior visits. Will have her start checking BP at home, daily, for the next 2 weeks. She will report back on My Chart at that time. Consider medication if BP consistently updated.

## 2017-10-26 NOTE — Progress Notes (Signed)
Subjective:    Patient ID: Jacqueline CaoMary K Haynes, female    DOB: Jul 10, 1961, 56 y.o.   MRN: [id number]  HPI  [name] is a 56 year old female who presents today with a chief complaint of elevated blood pressure readings. She also reports intermittent headaches and dizziness. These symptoms occurred several times in October and then 3 days ago. She's been checking her blood pressure irregularly for the past several months, mostly when experiencing symptoms and will get readings in the 140's/90's. She will check her BP sometimes without symptoms and will get readings of 120's/80's.   She was once managed on an antihypertensive in the past when taking Adderall, came off years ago, no problems since removal of Addreall and antihypertensive. She denies chest pain, visual changes. She has been under an increased amount of stress, thinks her symptoms are from this.   BP Readings from Last 3 Encounters:  10/26/17 (!) 140/94  08/12/17 122/78  07/05/17 124/80     Review of Systems  Constitutional: Negative for fatigue.  Eyes: Negative for visual disturbance.  Cardiovascular: Negative for chest pain.  Neurological: Positive for dizziness and headaches.  Psychiatric/Behavioral:       Increased personal stress       Past Medical History:  Diagnosis Date  . Anxiety   . Bipolar 1 disorder (HCC)   . Depression   . Hyperlipidemia   . Hypertension      Social History   Socioeconomic History  . Marital status: Divorced    Spouse name: Not on file  . Number of children: 2  . Years of education: Not on file  . Highest education level: Some college, no degree  Social Needs  . Financial resource strain: Not hard at all  . Food insecurity - worry: Never true  . Food insecurity - inability: Never true  . Transportation needs - medical: No  . Transportation needs - non-medical: No  Occupational History  . Occupation: [employer]    Comment: full time  Tobacco Use  . Smoking status: Never Smoker    . Smokeless tobacco: Never Used  Substance and Sexual Activity  . Alcohol use: No    Comment: social   . Drug use: No  . Sexual activity: No  Other Topics Concern  . Not on file  Social History Narrative   Single.   2 children.   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   Enjoys going to the movies, traveling.    Past Surgical History:  Procedure Laterality Date  . BUNIONECTOMY Left   . WISDOM TOOTH EXTRACTION      Family History  Problem Relation Age of Onset  . Arthritis Mother   . Hypertension Mother   . Hyperlipidemia Mother   . Hypothyroidism Mother   . Heart attack Father   . Bipolar disorder Brother   . Anxiety disorder Brother   . Depression Brother   . Depression Maternal Aunt     Allergies  Allergen Reactions  . Sulfa Antibiotics Rash    Current Outpatient Medications on File Prior to Visit  Medication Sig Dispense Refill  . Biotin 1000 MCG tablet Take by mouth.    . calcium-vitamin D (OSCAL WITH D) 500-200 MG-UNIT per tablet Take 1 tablet by mouth daily. For Vitamin D replacement and mood control. 30 tablet 0  . Multiple Vitamin (MULTIVITAMIN WITH MINERALS) TABS Take 1 tablet by mouth daily. For nutritional supplementation. 30 tablet 0  . venlafaxine XR (EFFEXOR-XR) 150 MG  24 hr capsule Take 1 capsule (150 mg total) daily with breakfast by mouth. 30 capsule 2   No current facility-administered medications on file prior to visit.     BP (!) 140/94   Pulse 67   Temp 98.2 F (36.8 C) (Oral)   Ht 5\' 2"  (1.575 m)   Wt 159 lb (72.1 kg)   LMP 05/28/2013   SpO2 96%   BMI 29.08 kg/m    Objective:   Physical Exam  Constitutional: She is oriented to person, place, and time. She appears well-nourished.  Eyes: EOM are normal.  Cardiovascular: Normal rate and regular rhythm.  Pulmonary/Chest: Effort normal and breath sounds normal.  Neurological: She is alert and oriented to person, place, and time. No cranial nerve deficit.  Skin: Skin is warm and dry.   Psychiatric: She has a normal mood and affect.          Assessment & Plan:

## 2017-11-29 ENCOUNTER — Encounter: Payer: Self-pay | Admitting: Psychiatry

## 2017-11-29 ENCOUNTER — Other Ambulatory Visit: Payer: Self-pay

## 2017-11-29 ENCOUNTER — Ambulatory Visit: Payer: Commercial Managed Care - PPO | Admitting: Psychiatry

## 2017-11-29 VITALS — BP 144/96 | HR 70 | Temp 98.2°F | Wt 163.2 lb

## 2017-11-29 DIAGNOSIS — F3341 Major depressive disorder, recurrent, in partial remission: Secondary | ICD-10-CM

## 2017-11-29 DIAGNOSIS — F411 Generalized anxiety disorder: Secondary | ICD-10-CM | POA: Diagnosis not present

## 2017-11-29 MED ORDER — VENLAFAXINE HCL ER 75 MG PO CP24: 75.0000 mg | ORAL_CAPSULE | Freq: Every day | ORAL | 2 refills | Status: DC

## 2017-11-29 MED ORDER — VENLAFAXINE HCL ER 37.5 MG PO CP24: 37.5000 mg | ORAL_CAPSULE | Freq: Every day | ORAL | 2 refills | Status: DC

## 2017-11-29 NOTE — Patient Instructions (Signed)
Exercising to Stay Healthy Exercising regularly is important. It has many health benefits, such as:  Improving your overall fitness, flexibility, and endurance.  Increasing your bone density.  Helping with weight control.  Decreasing your body fat.  Increasing your muscle strength.  Reducing stress and tension.  Improving your overall health.  In order to become healthy and stay healthy, it is recommended that you do moderate-intensity and vigorous-intensity exercise. You can tell that you are exercising at a moderate intensity if you have a higher heart rate and faster breathing, but you are still able to hold a conversation. You can tell that you are exercising at a vigorous intensity if you are breathing much harder and faster and cannot hold a conversation while exercising. How often should I exercise? Choose an activity that you enjoy and set realistic goals. Your health care provider can help you to make an activity plan that works for you. Exercise regularly as directed by your health care provider. This may include:  Doing resistance training twice each week, such as: ? Push-ups. ? Sit-ups. ? Lifting weights. ? Using resistance bands.  Doing a given intensity of exercise for a given amount of time. Choose from these options: ? 150 minutes of moderate-intensity exercise every week. ? 75 minutes of vigorous-intensity exercise every week. ? A mix of moderate-intensity and vigorous-intensity exercise every week.  Children, pregnant women, people who are out of shape, people who are overweight, and older adults may need to consult a health care provider for individual recommendations. If you have any sort of medical condition, be sure to consult your health care provider before starting a new exercise program. What are some exercise ideas? Some moderate-intensity exercise ideas include:  Walking at a rate of 1 mile in 15  minutes.  Biking.  Hiking.  Golfing.  Dancing.  Some vigorous-intensity exercise ideas include:  Walking at a rate of at least 4.5 miles per hour.  Jogging or running at a rate of 5 miles per hour.  Biking at a rate of at least 10 miles per hour.  Lap swimming.  Roller-skating or in-line skating.  Cross-country skiing.  Vigorous competitive sports, such as football, basketball, and soccer.  Jumping rope.  Aerobic dancing.  What are some everyday activities that can help me to get exercise?  Yard work, such as: ? Pushing a lawn mower. ? Raking and bagging leaves.  Washing and waxing your car.  Pushing a stroller.  Shoveling snow.  Gardening.  Washing windows or floors. How can I be more active in my day-to-day activities?  Use the stairs instead of the elevator.  Take a walk during your lunch break.  If you drive, park your car farther away from work or school.  If you take public transportation, get off one stop early and walk the rest of the way.  Make all of your phone calls while standing up and walking around.  Get up, stretch, and walk around every 30 minutes throughout the day. What guidelines should I follow while exercising?  Do not exercise so much that you hurt yourself, feel dizzy, or get very short of breath.  Consult your health care provider before starting a new exercise program.  Wear comfortable clothes and shoes with good support.  Drink plenty of water while you exercise to prevent dehydration or heat stroke. Body water is lost during exercise and must be replaced.  Work out until you breathe faster and your heart beats faster. This information is not   intended to replace advice given to you by your health care provider. Make sure you discuss any questions you have with your health care provider. Document Released: 12/12/2010 Document Revised: 04/16/2016 Document Reviewed: 04/12/2014 Elsevier Interactive Patient Education  2018  [employer] Calories are units of energy. Your body needs a certain amount of calories from food to keep you going throughout the day. When you eat more calories than your body needs, your body stores the extra calories as fat. When you eat fewer calories than your body needs, your body burns fat to get the energy it needs. Calorie counting means keeping track of how many calories you eat and drink each day. Calorie counting can be helpful if you need to lose weight. If you make sure to eat fewer calories than your body needs, you should lose weight. Ask your health care provider what a healthy weight is for you. For calorie counting to work, you will need to eat the right number of calories in a day in order to lose a healthy amount of weight per week. A dietitian can help you determine how many calories you need in a day and will give you suggestions on how to reach your calorie goal.  A healthy amount of weight to lose per week is usually 1-2 lb (0.5-0.9 kg). This usually means that your daily calorie intake should be reduced by 500-750 calories.  Eating 1,200 - 1,500 calories per day can help most women lose weight.  Eating 1,500 - 1,800 calories per day can help most men lose weight.  What is my plan? My goal is to have __________ calories per day. If I have this many calories per day, I should lose around __________ pounds per week. What do I need to know about calorie counting? In order to meet your daily calorie goal, you will need to:  Find out how many calories are in each food you would like to eat. Try to do this before you eat.  Decide how much of the food you plan to eat.  Write down what you ate and how many calories it had. Doing this is called keeping a food log.  To successfully lose weight, it is important to balance calorie counting with a healthy lifestyle that includes regular activity. Aim for 150 minutes of moderate exercise (such as  walking) or 75 minutes of vigorous exercise (such as running) each week. Where do I find calorie information?  The number of calories in a food can be found on a Nutrition Facts label. If a food does not have a Nutrition Facts label, try to look up the calories online or ask your dietitian for help. Remember that calories are listed per serving. If you choose to have more than one serving of a food, you will have to multiply the calories per serving by the amount of servings you plan to eat. For example, the label on a package of bread might say that a serving size is 1 slice and that there are 90 calories in a serving. If you eat 1 slice, you will have eaten 90 calories. If you eat 2 slices, you will have eaten 180 calories. How do I keep a food log? Immediately after each meal, record the following information in your food log:  What you ate. Don't forget to include toppings, sauces, and other extras on the food.  How much you ate. This can be measured in cups, ounces, or number of  items.  How many calories each food and drink had.  The total number of calories in the meal.  Keep your food log near you, such as in a small notebook in your pocket, or use a mobile app or website. Some programs will calculate calories for you and show you how many calories you have left for the day to meet your goal. What are some calorie counting tips?  Use your calories on foods and drinks that will fill you up and not leave you hungry: ? Some examples of foods that fill you up are nuts and nut butters, vegetables, lean proteins, and high-fiber foods like whole grains. High-fiber foods are foods with more than 5 g fiber per serving. ? Drinks such as sodas, specialty coffee drinks, alcohol, and juices have a lot of calories, yet do not fill you up.  Eat nutritious foods and avoid empty calories. Empty calories are calories you get from foods or beverages that do not have many vitamins or protein, such as  candy, sweets, and soda. It is better to have a nutritious high-calorie food (such as an avocado) than a food with few nutrients (such as a bag of chips).  Know how many calories are in the foods you eat most often. This will help you calculate calorie counts faster.  Pay attention to calories in drinks. Low-calorie drinks include water and unsweetened drinks.  Pay attention to nutrition labels for "low fat" or "fat free" foods. These foods sometimes have the same amount of calories or more calories than the full fat versions. They also often have added sugar, starch, or salt, to make up for flavor that was removed with the fat.  Find a way of tracking calories that works for you. Get creative. Try different apps or programs if writing down calories does not work for you. What are some portion control tips?  Know how many calories are in a serving. This will help you know how many servings of a certain food you can have.  Use a measuring cup to measure serving sizes. You could also try weighing out portions on a kitchen scale. With time, you will be able to estimate serving sizes for some foods.  Take some time to put servings of different foods on your favorite plates, bowls, and cups so you know what a serving looks like.  Try not to eat straight from a bag or box. Doing this can lead to overeating. Put the amount you would like to eat in a cup or on a plate to make sure you are eating the right portion.  Use smaller plates, glasses, and bowls to prevent overeating.  Try not to multitask (for example, watch TV or use your computer) while eating. If it is time to eat, sit down at a table and enjoy your food. This will help you to know when you are full. It will also help you to be aware of what you are eating and how much you are eating. What are tips for following this plan? Reading food labels  Check the calorie count compared to the serving size. The serving size may be smaller than what  you are used to eating.  Check the source of the calories. Make sure the food you are eating is high in vitamins and protein and low in saturated and trans fats. Shopping  Read nutrition labels while you shop. This will help you make healthy decisions before you decide to purchase your food.  Make a  grocery list and stick to it. Cooking  Try to cook your favorite foods in a healthier way. For example, try baking instead of frying.  Use low-fat dairy products. Meal planning  Use more fruits and vegetables. Half of your plate should be fruits and vegetables.  Include lean proteins like poultry and fish. How do I count calories when eating out?  Ask for smaller portion sizes.  Consider sharing an entree and sides instead of getting your own entree.  If you get your own entree, eat only half. Ask for a box at the beginning of your meal and put the rest of your entree in it so you are not tempted to eat it.  If calories are listed on the menu, choose the lower calorie options.  Choose dishes that include vegetables, fruits, whole grains, low-fat dairy products, and lean protein.  Choose items that are boiled, broiled, grilled, or steamed. Stay away from items that are buttered, battered, fried, or served with cream sauce. Items labeled "crispy" are usually fried, unless stated otherwise.  Choose water, low-fat milk, unsweetened iced tea, or other drinks without added sugar. If you want an alcoholic beverage, choose a lower calorie option such as a glass of wine or light beer.  Ask for dressings, sauces, and syrups on the side. These are usually high in calories, so you should limit the amount you eat.  If you want a salad, choose a garden salad and ask for grilled meats. Avoid extra toppings like bacon, cheese, or fried items. Ask for the dressing on the side, or ask for olive oil and vinegar or lemon to use as dressing.  Estimate how many servings of a food you are given. For  example, a serving of cooked rice is  cup or about the size of half a baseball. Knowing serving sizes will help you be aware of how much food you are eating at restaurants. The list below tells you how big or small some common portion sizes are based on everyday objects: ? 1 oz-4 stacked dice. ? 3 oz-1 deck of cards. ? 1 tsp-1 die. ? 1 Tbsp- a ping-pong ball. ? 2 Tbsp-1 ping-pong ball. ?  cup- baseball. ? 1 cup-1 baseball. Summary  Calorie counting means keeping track of how many calories you eat and drink each day. If you eat fewer calories than your body needs, you should lose weight.  A healthy amount of weight to lose per week is usually 1-2 lb (0.5-0.9 kg). This usually means reducing your daily calorie intake by 500-750 calories.  The number of calories in a food can be found on a Nutrition Facts label. If a food does not have a Nutrition Facts label, try to look up the calories online or ask your dietitian for help.  Use your calories on foods and drinks that will fill you up, and not on foods and drinks that will leave you hungry.  Use smaller plates, glasses, and bowls to prevent overeating. This information is not intended to replace advice given to you by your health care provider. Make sure you discuss any questions you have with your health care provider. Document Released: 11/09/2005 Document Revised: 10/09/2016 Document Reviewed: 10/09/2016 Elsevier Interactive Patient Education  [employer]2018 Elsevier Inc.

## 2017-11-29 NOTE — Progress Notes (Signed)
BH MD OP Progress Note  11/29/2017 11:45 AM [name]  MRN:  [id number]  Chief Complaint: ' I am ok.'  Chief Complaint    Follow-up; Medication Refill     HPI: [name] is a 57 year old Caucasian female, single, employed, lives in [place], has a history of depression and anxiety, is here for a follow-up visit.  He presented extremely late for her visit today.  [name] today reports that her Christmas and new year went well.  She enjoyed with her family.  She reports she had a family picture session which she really enjoyed.  She reports her boyfriend could not participate in it because he was not available.  She reports she is doing well on the Effexor XR 150 mg daily.  She reports she wants to start weaning it off.  She denies any depressive symptoms like sadness, crying spells or sleep issues.  She denies any suicidal thoughts or perceptual disturbances.  She does report some relational struggles with her boyfriend.  She however reports that this has been on ongoing since the past several years.  She reports they both are working on the same.  She does report she is concerned about her weight.  She has gained 3 pounds since her last visit with Clinical research associatewriter in November.  Discussed with her about calorie counting as as well as exercise.  She reports she does have back pain issues and she is not very physically active due to that.  She denies any substance abuse problems.  Her work as a LawyerCNA continues to go well.  Visit Diagnosis:    ICD-10-CM   1. MDD (major depressive disorder), recurrent, in partial remission (HCC) F33.41 venlafaxine XR (EFFEXOR XR) 75 MG 24 hr capsule    venlafaxine XR (EFFEXOR-XR) 37.5 MG 24 hr capsule   MODERATE  2. GAD (generalized anxiety disorder) F41.1 venlafaxine XR (EFFEXOR XR) 75 MG 24 hr capsule    venlafaxine XR (EFFEXOR-XR) 37.5 MG 24 hr capsule    Past Psychiatric History: History of depression and anxiety.  She reports [facility] [place].  She reports at least 2 suicide attempts in the past, by overdose, slit her wrists.  Past Medical History:  Past Medical History:  Diagnosis Date  . Anxiety   . Bipolar 1 disorder (HCC)   . Depression   . Hyperlipidemia   . Hypertension     Past Surgical History:  Procedure Laterality Date  . BUNIONECTOMY Left   . WISDOM TOOTH EXTRACTION      Family Psychiatric History: Her brother has bipolar disorder, maternal aunt-depression.  Denies substance abuse problems, suicide in her family.  Family History:  Family History  Problem Relation Age of Onset  . Arthritis Mother   . Hypertension Mother   . Hyperlipidemia Mother   . Hypothyroidism Mother   . Heart attack Father   . Bipolar disorder Brother   . Anxiety disorder Brother   . Depression Brother   . Depression Maternal Aunt     Social History: She is divorced.  She is currently in a relationship with her boyfriend.  She has 2 children who are adults.  She is employed as a LawyerCNA at [employer].  She lives with her mother in [place]. Social History   Socioeconomic History  . Marital status: Divorced    Spouse name: None  . Number of children: 2  . Years of education: None  . Highest education level: Some college, no degree  Social Needs  . Financial resource strain: Not hard at all  . Food insecurity - worry: Never true  . Food insecurity - inability: Never true  . Transportation needs - medical: No  . Transportation needs - non-medical: No  Occupational History  . Occupation: [employer]    Comment: full time  Tobacco Use  . Smoking status: Never Smoker  . Smokeless tobacco: Never Used  Substance and Sexual Activity  . Alcohol use: No    Comment: social   . Drug use: No  . Sexual activity: No  Other Topics Concern  . None  Social History Narrative   Single.   2 children.   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    Allergies:  Allergies  Allergen  Reactions  . Sulfa Antibiotics Rash    Metabolic Disorder Labs: No results found for: HGBA1C, MPG No results found for: PROLACTIN Lab Results  Component Value Date   CHOL 183 08/12/2017   TRIG 85.0 08/12/2017   HDL 71.70 08/12/2017   CHOLHDL 3 08/12/2017   VLDL 17.0 08/12/2017   LDLCALC 94 08/12/2017   LDLCALC 110 (H) 01/20/2017   Lab Results  Component Value Date   TSH 1.58 01/20/2017   TSH 1.588 08/19/2012    Therapeutic Level Labs: No results found for: LITHIUM Lab Results  Component Value Date   VALPROATE 74 02/10/2012   VALPROATE 166 (H) 02/09/2012   No components found for:  CBMZ  Current Medications: Current Outpatient Medications  Medication Sig Dispense Refill  . Biotin 1000 MCG tablet Take by mouth.    . calcium-vitamin D (OSCAL WITH D) 500-200 MG-UNIT per tablet Take 1 tablet by mouth daily. For Vitamin D replacement and mood control. 30 tablet 0  . Multiple Vitamin (MULTIVITAMIN WITH MINERALS) TABS Take 1 tablet by mouth daily. For nutritional supplementation. 30 tablet 0  . venlafaxine XR (EFFEXOR XR) 75 MG 24 hr capsule Take 1 capsule (75 mg total) by mouth daily with breakfast. TO BE COMBINED WITH 37.5 MG TO MAKE IT 112.5 MG 30 capsule 2  . venlafaxine XR (EFFEXOR-XR) 37.5 MG 24 hr capsule Take 1 capsule (37.5 mg total) by mouth daily with breakfast. TO BE COMBINED WITH 75 MG TO MAKE IT 112.5 MG. 30 capsule 2   No current facility-administered medications for this visit.      Musculoskeletal: Strength & Muscle Tone: within normal limits Gait & Station: normal Patient leans: N/A  Psychiatric Specialty Exam: Review of Systems  Psychiatric/Behavioral: Positive for depression (IMPROVED).  All other systems reviewed and are negative.   Blood pressure (!) 144/96, pulse 70, temperature 98.2 F (36.8 C), temperature source Oral, weight 163 lb 3.2 oz (74 kg), last menstrual period 05/28/2013.Body mass index is 29.85 kg/m.  General Appearance: Fairly  Groomed  Eye Contact:  Fair  Speech:  Normal Rate  Volume:  Normal  Mood:  Euthymic  Affect:  Congruent  Thought Process:  Goal Directed and Descriptions of Associations: Intact  Orientation:  Full (Time, Place, and Person)  Thought Content: Logical   Suicidal Thoughts:  No  Homicidal Thoughts:  No  Memory:  Immediate;   Fair Recent;   Fair Remote;   Fair  Judgement:  Fair  Insight:  Fair  Psychomotor Activity:  Normal  Concentration:  Concentration: Fair and Attention Span: Fair  Recall:  [employer] of Knowledge: Fair  Language: Fair  Akathisia:  No  Handed:  Right  AIMS (if indicated): NA  Assets:  Communication Skills Desire for Improvement Financial Resources/Insurance Housing Intimacy Leisure Time Physical Health Resilience Social Support Talents/Skills Transportation Vocational/Educational  ADL's:  Intact  Cognition: WNL  Sleep:  Fair   Screenings:PHQ 9 , GAD 7    Assessment and Plan: Keyira is a 57 year old Caucasian female who has a history of depression and anxiety, presented today for a follow-up visit.  Shianna currently is doing well on her current medication Effexor XR.  Annarose would like her medication to be slowly weaned off.  Discussed with her that we can do it gradually over the course of next several months.  She denies any mood symptoms at this time.  She also has good social support and is employed.  She continues to be a good candidate for outpatient treatment.  Plan For depression Continue reduce Effexor XR to 112.5 mg p.o. daily PHQ 9 equals 1  For anxiety Reduce Effexor XR 112.5 mg p.o. daily GAD 7 equals 5  Also discussed calorie counting to manage her weight as well as exercise.  Gave her printed handouts.  She will follow-up in clinic if her mood sx gets worse or if she needs help.  Follow-up in 2 months.  More than 50 % of the time was spent for psychoeducation and supportive psychotherapy and care coordination.  This note was  generated in part or whole with voice recognition software. Voice recognition is usually quite accurate but there are transcription errors that can and very often do occur. I apologize for any typographical errors that were not detected and corrected.        Jomarie Longs, MD 11/29/2017, 11:45 AM

## 2018-01-19 ENCOUNTER — Telehealth: Payer: Self-pay

## 2018-01-19 ENCOUNTER — Ambulatory Visit (INDEPENDENT_AMBULATORY_CARE_PROVIDER_SITE_OTHER): Payer: Commercial Managed Care - PPO | Admitting: Family Medicine

## 2018-01-19 ENCOUNTER — Encounter: Payer: Self-pay | Admitting: Family Medicine

## 2018-01-19 ENCOUNTER — Encounter: Payer: Self-pay | Admitting: Primary Care

## 2018-01-19 VITALS — BP 142/82 | HR 71 | Temp 97.7°F | Wt 164.5 lb

## 2018-01-19 DIAGNOSIS — F411 Generalized anxiety disorder: Secondary | ICD-10-CM | POA: Diagnosis not present

## 2018-01-19 DIAGNOSIS — I10 Essential (primary) hypertension: Secondary | ICD-10-CM

## 2018-01-19 MED ORDER — LISINOPRIL 5 MG PO TABS: 5.0000 mg | ORAL_TABLET | Freq: Every day | ORAL | 3 refills | Status: DC

## 2018-01-19 NOTE — Progress Notes (Signed)
   Subjective:    Patient ID: [name], female    DOB: December 18, 1960, 57 y.o.   MRN: [id number]  HPI This is a 57 yo female who presents today with elevated blood pressure. Has been checking at home and readings have been 140-150s/90-100. 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 Feels stressed and smothered. Everything is bothering her. No chest pain, no SOB, no pedal edema. Only checking her blood pressure when she feels like it is high. She requests work excuse for yesterday and today.   Past Medical History:  Diagnosis Date  . Anxiety   . Bipolar 1 disorder (HCC)   . Depression   . Hyperlipidemia   . Hypertension    Past Surgical History:  Procedure Laterality Date  . BUNIONECTOMY Left   . WISDOM TOOTH EXTRACTION     Family History  Problem Relation Age of Onset  . Arthritis Mother   . Hypertension Mother   . Hyperlipidemia Mother   . Hypothyroidism Mother   . Heart attack Father   . Bipolar disorder Brother   . Anxiety disorder Brother   . Depression Brother   . Depression Maternal Aunt    Social History   Tobacco Use  . Smoking status: Never Smoker  . Smokeless tobacco: Never Used  Substance Use Topics  . Alcohol use: No    Comment: social   . Drug use: No      Review of Systems Per HPI    Objective:   Physical Exam Physical Exam  Constitutional: Oriented to person, place, and time. She appears well-developed and well-nourished.  HENT:  Head: Normocephalic and atraumatic.  Eyes: Conjunctivae are normal.  Cardiovascular: Normal rate, regular rhythm and normal heart sounds.   Pulmonary/Chest: Effort normal and breath sounds normal.  Musculoskeletal: No pretibial edema  Neurological: Alert and oriented to person, place, and time.  Skin: Skin is warm and dry.  Psychiatric: Normal mood and affect. Behavior is normal. Judgment and thought content normal.  Vitals reviewed.     BP (!) 142/82   Pulse 71   Temp 97.7 F (36.5 C)  (Oral)   Wt 164 lb 8 oz (74.6 kg)   LMP 05/28/2013   SpO2 98%   BMI 30.09 kg/m  Wt Readings from Last 3 Encounters:  01/19/18 164 lb 8 oz (74.6 kg)  10/26/17 159 lb (72.1 kg)  08/12/17 156 lb (70.8 kg)   BP Readings from Last 3 Encounters:  01/19/18 (!) 142/82  10/26/17 (!) 140/94  08/12/17 122/78       Assessment & Plan:  1. Essential hypertension - very borderline for treatment but is causing her great anxiety, will start lisinopril 5 mg - check blood pressures at home 2-3 times per week, keep log - follow up in 1 month for recheck and labs - lisinopril (PRINIVIL,ZESTRIL) 5 MG tablet; Take 1 tablet (5 mg total) by mouth daily.  Dispense: 30 tablet; Refill: 3  2. Generalized anxiety disorder - encouraged relaxation techniques, increased exercise as tolerated, good sleep habits   Olean Reeeborah Krisann Mckenna, FNP-BC  [facility], [facility]  01/19/2018 3:05 PM

## 2018-01-19 NOTE — Telephone Encounter (Signed)
Pt walked in with elevated BP; pt said BP today 150/100.No CP, SOB,H/A or dizziness. Pt said for 2 wks BP has been elevated more than usual; pt having more stress at work. Pt only other complaint is no energy. Pt will see Harlin Heys Gessner FNP now.

## 2018-01-19 NOTE — Patient Instructions (Signed)
Please follow up with [name] in 4 weeks   How to Take Your Blood Pressure You can take your blood pressure at home with a machine. You may need to check your blood pressure at home:  To check if you have high blood pressure (hypertension).  To check your blood pressure over time.  To make sure your blood pressure medicine is working.  Supplies needed: You will need a blood pressure machine, or monitor. You can buy one at a drugstore or online. When choosing one:  Choose one with an arm cuff.  Choose one that wraps around your upper arm. Only one finger should fit between your arm and the cuff.  Do not choose one that measures your blood pressure from your wrist or finger.  Your doctor can suggest a monitor. How to prepare Avoid these things for 30 minutes before checking your blood pressure:  Drinking caffeine.  Drinking alcohol.  Eating.  Smoking.  Exercising.  Five minutes before checking your blood pressure:  Pee.  Sit in a dining chair. Avoid sitting in a soft couch or armchair.  Be quiet. Do not talk.  How to take your blood pressure Follow the instructions that came with your machine. If you have a digital blood pressure monitor, these may be the instructions: 1. Sit up straight. 2. Place your feet on the floor. Do not cross your ankles or legs. 3. Rest your left arm at the level of your heart. You may rest it on a table, desk, or chair. 4. Pull up your shirt sleeve. 5. Wrap the blood pressure cuff around the upper part of your left arm. The cuff should be 1 inch (2.5 cm) above your elbow. It is best to wrap the cuff around bare skin. 6. Fit the cuff snugly around your arm. You should be able to place only one finger between the cuff and your arm. 7. Put the cord inside the groove of your elbow. 8. Press the power button. 9. Sit quietly while the cuff fills with air and loses air. 10. Write down the numbers on the screen. 11. Wait 2-3 minutes and then repeat  steps 1-10.  What do the numbers mean? Two numbers make up your blood pressure. The first number is called systolic pressure. The second is called diastolic pressure. An example of a blood pressure reading is "120 over 80" (or 120/80). If you are an adult and do not have a medical condition, use this guide to find out if your blood pressure is normal: Normal  First number: below 120.  Second number: below 80. Elevated  First number: 120-129.  Second number: below 80. Hypertension stage 1  First number: 130-139.  Second number: 80-89. Hypertension stage 2  First number: 140 or above.  Second number: 90 or above. Your blood pressure is above normal even if only the top or bottom number is above normal. Follow these instructions at home:  Check your blood pressure as often as your doctor tells you to.  Take your monitor to your next doctor's appointment. Your doctor will: ? Make sure you are using it correctly. ? Make sure it is working right.  Make sure you understand what your blood pressure numbers should be.  Tell your doctor if your medicines are causing side effects. Contact a doctor if:  Your blood pressure keeps being high. Get help right away if:  Your first blood pressure number is higher than 180.  Your second blood pressure number is higher than  120. This information is not intended to replace advice given to you by your health care provider. Make sure you discuss any questions you have with your health care provider. Document Released: 10/22/2008 Document Revised: 10/07/2016 Document Reviewed: 04/17/2016 Elsevier Interactive Patient Education  [employer].

## 2018-01-27 ENCOUNTER — Ambulatory Visit (INDEPENDENT_AMBULATORY_CARE_PROVIDER_SITE_OTHER): Payer: Commercial Managed Care - PPO | Admitting: Psychiatry

## 2018-01-27 ENCOUNTER — Other Ambulatory Visit: Payer: Self-pay

## 2018-01-27 ENCOUNTER — Encounter: Payer: Self-pay | Admitting: Psychiatry

## 2018-01-27 DIAGNOSIS — F411 Generalized anxiety disorder: Secondary | ICD-10-CM

## 2018-01-27 DIAGNOSIS — F3341 Major depressive disorder, recurrent, in partial remission: Secondary | ICD-10-CM

## 2018-01-27 MED ORDER — VENLAFAXINE HCL ER 75 MG PO CP24: 75.0000 mg | ORAL_CAPSULE | Freq: Every day | ORAL | 0 refills | Status: DC

## 2018-01-27 NOTE — Progress Notes (Signed)
BH MD OP Progress Note  01/27/2018 1:15 PM [name]  MRN:  [id number]  Chief Complaint: ' I am ok." Chief Complaint    Follow-up; Medication Refill     HPI: [name] is a 57 y old CF, single, employed, lives in [place], has a history of depression and anxiety, is here for a follow-up visit.  Patient today reports that she is doing well on the current medication.  She wonders whether her dose of effexor  can still be reduced to a smaller dose.  She reports she would like to taper off of Effexor slowly.  She does report some stressors at work.  She reports she has a high workload and lifting up patients and taking care of them can be difficult and is putting a strain on her back.  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  She reports she may go back to that job however she enjoys being a Lawyer and is also going to school and working on her nursing license at this time.  She reports she continues to have a good family network support system.  Denies using any drugs or alcohol at this time.  She reports she is not interested in psychotherapy at this time and she is coping okay now.  She does report she was recently started on blood pressure medication, lisinopril.  She reports she is tolerating it well. Visit Diagnosis:    ICD-10-CM   1. MDD (major depressive disorder), recurrent, in partial remission (HCC) F33.41 venlafaxine XR (EFFEXOR XR) 75 MG 24 hr capsule   MODERATE  2. GAD (generalized anxiety disorder) F41.1 venlafaxine XR (EFFEXOR XR) 75 MG 24 hr capsule    Past Psychiatric History:Hx of  Depression and anxiety.  She reports 3 inpatient behavioral health admission at [facility], [place].  She reports at least 2 suicide attempts in the past, by overdose, slit her wrist.  Past Medical History:  Past Medical History:  Diagnosis Date  . Anxiety   . Bipolar 1 disorder (HCC)   . Depression   . Hyperlipidemia   . Hypertension     Past Surgical History:   Procedure Laterality Date  . BUNIONECTOMY Left   . WISDOM TOOTH EXTRACTION      Family Psychiatric History: Her brother has bipolar disorder, maternal aunt-depression.  Denies substance abuse problems, suicide in her family.  Family History:  Family History  Problem Relation Age of Onset  . Arthritis Mother   . Hypertension Mother   . Hyperlipidemia Mother   . Hypothyroidism Mother   . Heart attack Father   . Bipolar disorder Brother   . Anxiety disorder Brother   . Depression Brother   . Depression Maternal Aunt   Substance abuse history: Denies   Social History: Divorced.  She is currently in a relationship with her boyfriend.  She has 2 adult children.  She is employed as a Lawyer at [employer].  She lives with her mother in [place]. She is also in school to get nursing degree. Social History   Socioeconomic History  . Marital status: Divorced    Spouse name: None  . Number of children: 2  . Years of education: None  . Highest education level: Some college, no degree  Social Needs  . Financial resource strain: Not hard at all  . Food insecurity - worry: Never true  . Food insecurity - inability: Never true  . Transportation needs - medical: No  .  Transportation needs - non-medical: No  Occupational History  . Occupation: [employer]    Comment: full time  Tobacco Use  . Smoking status: Never Smoker  . Smokeless tobacco: Never Used  Substance and Sexual Activity  . Alcohol use: No    Comment: social   . Drug use: No  . Sexual activity: No  Other Topics Concern  . None  Social History Narrative   Single.   2 children.   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    Allergies:  Allergies  Allergen Reactions  . Sulfa Antibiotics Rash    Metabolic Disorder Labs: No results found for: HGBA1C, MPG No results found for: PROLACTIN Lab Results  Component Value Date   CHOL 183 08/12/2017   TRIG 85.0 08/12/2017   HDL 71.70  08/12/2017   CHOLHDL 3 08/12/2017   VLDL 17.0 08/12/2017   LDLCALC 94 08/12/2017   LDLCALC 110 (H) 01/20/2017   Lab Results  Component Value Date   TSH 1.58 01/20/2017   TSH 1.588 08/19/2012    Therapeutic Level Labs: No results found for: LITHIUM Lab Results  Component Value Date   VALPROATE 74 02/10/2012   VALPROATE 166 (H) 02/09/2012   No components found for:  CBMZ  Current Medications: Current Outpatient Medications  Medication Sig Dispense Refill  . Biotin 1000 MCG tablet Take by mouth.    . calcium-vitamin D (OSCAL WITH D) 500-200 MG-UNIT per tablet Take 1 tablet by mouth daily. For Vitamin D replacement and mood control. 30 tablet 0  . lisinopril (PRINIVIL,ZESTRIL) 5 MG tablet Take 1 tablet (5 mg total) by mouth daily. 30 tablet 3  . Multiple Vitamin (MULTIVITAMIN WITH MINERALS) TABS Take 1 tablet by mouth daily. For nutritional supplementation. 30 tablet 0  . venlafaxine XR (EFFEXOR XR) 75 MG 24 hr capsule Take 1 capsule (75 mg total) by mouth daily with breakfast. 15 capsule 0   No current facility-administered medications for this visit.      Musculoskeletal: Strength & Muscle Tone: within normal limits Gait & Station: normal Patient leans: N/A  Psychiatric Specialty Exam: Review of Systems  Psychiatric/Behavioral: The patient is nervous/anxious (improved).   All other systems reviewed and are negative.   Blood pressure 131/86, pulse 70, temperature 98 F (36.7 C), temperature source Oral, weight 160 lb (72.6 kg), last menstrual period 05/28/2013.Body mass index is 29.26 kg/m.  General Appearance: Casual  Eye Contact:  Fair  Speech:  Clear and Coherent  Volume:  Normal  Mood:  Euthymic  Affect:  Appropriate  Thought Process:  Goal Directed and Descriptions of Associations: Intact  Orientation:  Full (Time, Place, and Person)  Thought Content: Logical   Suicidal Thoughts:  No  Homicidal Thoughts:  No  Memory:  Immediate;   Fair Recent;    Fair Remote;   Fair  Judgement:  Fair  Insight:  Fair  Psychomotor Activity:  Normal  Concentration:  Concentration: Fair and Attention Span: Fair  Recall:  [employer] of Knowledge: Fair  Language: Fair  Akathisia:  No  Handed:  Right  AIMS (if indicated): NA  Assets:  Communication Skills Desire for Improvement Social Support Talents/Skills  ADL's:  Intact  Cognition: WNL  Sleep:  Fair   Screenings:   Assessment and Plan: [name] is a 57 year old Caucasian female who has a history of depression and anxiety, presented today for a follow-up visit.  [name] continues to do well on the reduced dose  of Effexor XR.  She would like her medication to be slowly weaned off and would like more dose  reduction today.  She denies any new changes in her mood.  She reports she has been coping with her work stress.  She also is in school getting her nursing degree.  She continues to be a good candidate for outpatient treatment.  Plan For depression Continue to reduce Effexor XR to 75 mg p.o. Daily. I will send 15 tablets to her pharmacy today.  She will call me back if she tolerates the dose reduction well.   For anxiety Reduce Effexor XR to 75 mg p.o. Daily.  Discussed exercise, diet management.  Discussed psychotherapy referral.  She reports she is not interested at this time.  She does have work-related stress but she reports she has a good social network available for support.  Follow-up in clinic in 6-8 weeks or sooner if needed.  More than 50 % of the time was spent for psychoeducation and supportive psychotherapy and care coordination.  This note was generated in part or whole with voice recognition software. Voice recognition is usually quite accurate but there are transcription errors that can and very often do occur. I apologize for any typographical errors that were not detected and corrected.        Jomarie LongsSaramma Lyonel Morejon, MD 01/27/2018, 1:15 PM

## 2018-03-03 ENCOUNTER — Other Ambulatory Visit: Payer: Self-pay | Admitting: Psychiatry

## 2018-03-03 DIAGNOSIS — F411 Generalized anxiety disorder: Secondary | ICD-10-CM

## 2018-03-03 DIAGNOSIS — F3341 Major depressive disorder, recurrent, in partial remission: Secondary | ICD-10-CM

## 2018-03-24 ENCOUNTER — Ambulatory Visit: Payer: Self-pay | Admitting: Psychiatry

## 2018-03-24 LAB — HM PAP SMEAR

## 2018-03-28 ENCOUNTER — Ambulatory Visit: Payer: Self-pay | Admitting: *Deleted

## 2018-03-28 NOTE — Telephone Encounter (Signed)
Pt called with some dizziness and a b/p of 97/58 and heart rate of 60 this morning. Pt stated she stopped taking her lisinopril on Friday because her b/ps had been good. This this morning her bp is 97/58.  Advised her to recheck her b/p and it was 144/93. Advised to wait and recheck it was 119/73. She states that she needs batteries for her bp monitor. She denies dizziness now.  Advised to go to a drug store and recheck her b/p. She is worried about her trigger finger. 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 No protocol found because she had stopped her b/p med and dizziness was brief and also was the low b/p. Will route to [facility].  Appointment already scheduled for tomorrow for her trigger finger.  Answer Assessment - Initial Assessment Questions 1. BLOOD PRESSURE: "What is the blood pressure?" "Did you take at least two measurements 5 minutes apart?"     97/58 HR 60 30 mins ago and now 144/93 HR 65 and 5 mins later 119/73 HR 61 2. ONSET: "When did you take your blood pressure?"     This morning 3. HOW: "How did you obtain the blood pressure?" (e.g., visiting nurse, automatic home BP monitor)     Automatic home BP monitor 4. HISTORY: "Do you have a history of low blood pressure?" "What is your blood pressure normally?"     No, has hx of hight blood pressure 5. MEDICATIONS: "Are you taking any medications for blood pressure?" If yes: "Have they been changed recently?"     The last time took b/p meds was on Friday 6. PULSE RATE: "Do you know what your pulse rate is?"      Yes 60-65 7. OTHER SYMPTOMS: "Have you been sick recently?" "Have you had a recent injury?"     No just stressed.8. PREGNANCY: "Is there any chance you are pregnant?" "When was your last menstrual period?"     no  Protocols used: LOW BLOOD PRESSURE-A-AH

## 2018-03-28 NOTE — Telephone Encounter (Signed)
Pt has appt 03/30/18 at 9:40 with Dr Patsy Lager for trigger finger. FYI to Allayne Gitelman NP.

## 2018-03-28 NOTE — Telephone Encounter (Signed)
Noted. Please have patient continue to monitor BP readings and report to me if readings are consistently at or above 135/90.

## 2018-03-28 NOTE — Telephone Encounter (Signed)
Patient notified as instructed by telephone and verbalized understanding. 

## 2018-03-29 ENCOUNTER — Ambulatory Visit: Payer: Self-pay | Admitting: Family Medicine

## 2018-03-29 NOTE — Progress Notes (Signed)
Dr. Karleen Hampshire T. Zabella Wease, MD, CAQ Sports Medicine [facility] [address] [place], [address] Phone: [phone] Fax: [phone]  03/30/2018  Patient: [name], MRN: [id number], DOB: Jul 25, 1961, 57 y.o.  Primary Physician:  Doreene Nest, NP   Chief Complaint  Patient presents with  . Trigger finger    Left ring finger   Subjective:   [name] is a 57 y.o. very pleasant female patient who presents with the following:  Pleasant patient who I been asked to evaluate for relatively acute finger pain.  On further questioning, it seems like she is actually had some triggering at this joint for some time, but there was a change recently where she had more pain and she had distinct triggering where she could not really get it on catch.  She also asked me about a number of other medical problems, and asked me to do follow-up blood work on her for long-term longitudinal care.  Has had some triggerig  L 4th trigger finger injection.  Past Medical History, Surgical History, Social History, Family History, Problem List, Medications, and Allergies have been reviewed and updated if relevant.  Patient Active Problem List   Diagnosis Date Noted  . Essential hypertension 10/26/2017  . Fatigue 09/27/2017  . 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 Preventative health care 08/12/2017  . Hyperlipidemia 01/19/2017  . Generalized anxiety disorder 08/19/2012    Class: Chronic    Past Medical History:  Diagnosis Date  . Anxiety   . Bipolar 1 disorder (HCC)   . Depression   . Hyperlipidemia   . Hypertension     Past Surgical History:  Procedure Laterality Date  . BUNIONECTOMY Left   . WISDOM TOOTH EXTRACTION      Social History   Socioeconomic History  . Marital status: Divorced    Spouse name: Not on file  . Number of children: 2  . Years of education: Not on file  . Highest education level: Some college, no degree  Occupational  History  . Occupation: [employer]    Comment: full time  Social Needs  . Financial resource strain: Not hard at all  . Food insecurity:    Worry: Never true    Inability: Never true  . Transportation needs:    Medical: No    Non-medical: No  Tobacco Use  . Smoking status: Never Smoker  . Smokeless tobacco: Never Used  Substance and Sexual Activity  . Alcohol use: No    Comment: social   . Drug use: No  . Sexual activity: Never  Lifestyle  . Physical activity:    Days per week: 3 days    Minutes per session: 30 min  . Stress: To some extent  Relationships  . Social connections:    Talks on phone: More than three times a week    Gets together: Once a week    Attends religious service: Patient refused    Active member of club or organization: Yes    Attends meetings of clubs or organizations: More than 4 times per year    Relationship status: Divorced  . Intimate partner violence:    Fear of current or ex partner: No    Emotionally abused: No    Physically abused: No    Forced sexual activity: No  Other Topics Concern  . Not on file  Social History Narrative   Single.   2 children.   Works as Lawyer at  [employer].   Enjoys going to the movies, traveling.    Family History  Problem Relation Age of Onset  . Arthritis Mother   . Hypertension Mother   . Hyperlipidemia Mother   . Hypothyroidism Mother   . Heart attack Father   . Bipolar disorder Brother   . Anxiety disorder Brother   . Depression Brother   . Depression Maternal Aunt     Allergies  Allergen Reactions  . Sulfa Antibiotics Rash    Medication list reviewed and updated in full in [facility].  GEN: No fevers, chills. Nontoxic. Primarily MSK c/o today. MSK: Detailed in the HPI GI: tolerating PO intake without difficulty Neuro: No numbness, parasthesias, or tingling associated. Otherwise the pertinent positives of the ROS are noted above.   Objective:   BP 106/80   Pulse 73   Temp 98.6  F (37 C) (Oral)   Ht  (1.575 m)   Wt 162 lb 8 oz (73.7 kg)   LMP 05/28/2013   BMI 29.72 kg/m    GEN: WDWN, NAD, Non-toxic, Alert & Oriented x 3 HEENT: Atraumatic, Normocephalic.  Ears and Nose: No external deformity. EXTR: No clubbing/cyanosis/edema NEURO: Normal gait.  PSYCH: Normally interactive. Conversant. Not depressed or anxious appearing.  Calm demeanor.   L hand Ecchymosis or edema: neg ROM wrist/hand/digits: full  Carpals, MCP's, digits: NT Distal Ulna and Radius: NT Ecchymosis or edema: neg No instability Cysts/nodules: henerden's changes Digit triggering: L 4th Finkelstein's test: neg Snuffbox tenderness: neg Scaphoid tubercle: NT Resisted supination: NT Full composite fist, no malrotation Grip, all digits: 5/5 str DIPJT: NT PIP JT: NT MCP JT: NT 4th flexor tenosynovitis Axial load test: neg Phalen's: neg Tinel's: neg Atrophy: neg  Hand sensation: intact   Radiology: Results for orders placed or performed in visit on 03/30/18  Basic metabolic panel  Result Value Ref Range   Sodium 139 135 - 145 mEq/L   Potassium 4.6 3.5 - 5.1 mEq/L   Chloride 102 96 - 112 mEq/L   CO2 30 19 - 32 mEq/L   Glucose, Bld 93 70 - 99 mg/dL   BUN 19 6 - 23 mg/dL   Creatinine, Ser 0.10 0.40 - 1.20 mg/dL   Calcium 9.0 8.4 - 27.2 mg/dL   GFR 53.66 >44.03 mL/min  CBC with Differential/Platelet  Result Value Ref Range   WBC 6.5 4.0 - 10.5 K/uL   RBC 4.45 3.87 - 5.11 Mil/uL   Hemoglobin 13.4 12.0 - 15.0 g/dL   HCT 47.4 25.9 - 56.3 %   MCV 88.4 78.0 - 100.0 fl   MCHC 34.1 30.0 - 36.0 g/dL   RDW 87.5 64.3 - 32.9 %   Platelets 283.0 150.0 - 400.0 K/uL   Neutrophils Relative % 60.7 43.0 - 77.0 %   Lymphocytes Relative 27.3 12.0 - 46.0 %   Monocytes Relative 9.3 3.0 - 12.0 %   Eosinophils Relative 2.0 0.0 - 5.0 %   Basophils Relative 0.7 0.0 - 3.0 %   Neutro Abs 3.9 1.4 - 7.7 K/uL   Lymphs Abs 1.8 0.7 - 4.0 K/uL   Monocytes Absolute 0.6 0.1 - 1.0 K/uL    Eosinophils Absolute 0.1 0.0 - 0.7 K/uL   Basophils Absolute 0.0 0.0 - 0.1 K/uL  Hepatic function panel  Result Value Ref Range   Total Bilirubin 0.5 0.2 - 1.2 mg/dL   Bilirubin, Direct 0.1 0.0 - 0.3 mg/dL   Alkaline Phosphatase 76 39 - 117 U/L   AST 22  0 - 37 U/L   ALT 18 0 - 35 U/L   Total Protein 6.8 6.0 - 8.3 g/dL   Albumin 4.1 3.5 - 5.2 g/dL  Lipid panel  Result Value Ref Range   Cholesterol 190 0 - 200 mg/dL   Triglycerides 29.5 0.0 - 149.0 mg/dL   HDL 62.13 >08.65 mg/dL   VLDL 78.4 0.0 - 69.6 mg/dL   LDL Cholesterol 295 (H) 0 - 99 mg/dL   Total CHOL/HDL Ratio 3    NonHDL 127.69   TSH  Result Value Ref Range   TSH 1.30 0.35 - 4.50 uIU/mL     Assessment and Plan:   Trigger finger, left ring finger - Plan: methylPREDNISolone acetate (DEPO-MEDROL) injection 20 mg  Essential hypertension  Pure hypercholesterolemia - Plan: Lipid panel  Encounter for long-term (current) use of medications - Plan: Basic metabolic panel, CBC with Differential/Platelet, Hepatic function panel  Other fatigue - Plan: TSH  I reviewed with the patent the structures involved and how they related to their diagnosis .  We discussed the pathophysiology of trigger fingers. Discussed the inflammatory nature of nodule creation and likely nodule abutting the A1 pulley system, this causing the patient's discomfort and sensations. We discussed that treatments for this include direct injection into the tendon sheath to attempt to shrink catching tissue. This can be done 1-2 times. Other treatments include surgical release. If the patient fails to trigger finger injections, I would recommend trigger finger release if the patient desires relief of the symptoms.   Additionally, I did do longitudinal healthcare management with laboratories and follow-up at her request.  Trigger Finger Injection, L Verbal consent was obtained. Risks (including rare risk of infection, potential risk for skin lightening and  potential atrophy), benefits and alternatives were discussed. Prepped with Chloraprep and Ethyl Chloride used for anesthesia. Under sterile conditions, patient injected at palmar crease aiming distally with 45 degree angle towards nodule; injected directly into tendon sheath. Medication flowed freely without resistance.  Needle size: 22 gauge 1 1/2 inch Injection: 1/2 cc of Lidocaine 1% and Depo-Medrol 20 mg   Follow-up: No follow-ups on file.  Meds ordered this encounter  Medications  . methylPREDNISolone acetate (DEPO-MEDROL) injection 20 mg   Orders Placed This Encounter  Procedures  . Basic metabolic panel  . CBC with Differential/Platelet  . Hepatic function panel  . Lipid panel  . TSH    Signed,  Jacqueline Hampshire T. Tanmay Halteman, MD   Allergies as of 03/30/2018      Reactions   Sulfa Antibiotics Rash      Medication List        Accurate as of 03/30/18 11:59 PM. Always use your most recent med list.          Biotin 1000 MCG tablet Take by mouth.   calcium-vitamin D 500-200 MG-UNIT tablet Commonly known as:  OSCAL WITH D Take 1 tablet by mouth daily. For Vitamin D replacement and mood control.   lisinopril 5 MG tablet Commonly known as:  PRINIVIL,ZESTRIL Take 1 tablet (5 mg total) by mouth daily.   multivitamin with minerals Tabs tablet Take 1 tablet by mouth daily. For nutritional supplementation.   venlafaxine XR 75 MG 24 hr capsule Commonly known as:  EFFEXOR XR Take 1 capsule (75 mg total) by mouth daily with breakfast.

## 2018-03-30 ENCOUNTER — Encounter: Payer: Self-pay | Admitting: Psychiatry

## 2018-03-30 ENCOUNTER — Ambulatory Visit: Payer: Commercial Managed Care - PPO | Admitting: Family Medicine

## 2018-03-30 ENCOUNTER — Other Ambulatory Visit: Payer: Self-pay

## 2018-03-30 ENCOUNTER — Encounter: Payer: Self-pay | Admitting: Family Medicine

## 2018-03-30 ENCOUNTER — Ambulatory Visit: Payer: Commercial Managed Care - PPO | Admitting: Psychiatry

## 2018-03-30 VITALS — BP 106/80 | HR 73 | Temp 98.6°F | Ht 62.0 in | Wt 162.5 lb

## 2018-03-30 VITALS — BP 123/78 | HR 62 | Temp 98.0°F | Wt 162.6 lb

## 2018-03-30 DIAGNOSIS — Z79899 Other long term (current) drug therapy: Secondary | ICD-10-CM

## 2018-03-30 DIAGNOSIS — M65342 Trigger finger, left ring finger: Secondary | ICD-10-CM

## 2018-03-30 DIAGNOSIS — E7800 Pure hypercholesterolemia, unspecified: Secondary | ICD-10-CM | POA: Diagnosis not present

## 2018-03-30 DIAGNOSIS — R5383 Other fatigue: Secondary | ICD-10-CM

## 2018-03-30 DIAGNOSIS — F3341 Major depressive disorder, recurrent, in partial remission: Secondary | ICD-10-CM | POA: Diagnosis not present

## 2018-03-30 DIAGNOSIS — F411 Generalized anxiety disorder: Secondary | ICD-10-CM

## 2018-03-30 DIAGNOSIS — I10 Essential (primary) hypertension: Secondary | ICD-10-CM | POA: Diagnosis not present

## 2018-03-30 LAB — BASIC METABOLIC PANEL
BUN: 19 mg/dL (ref 6–23)
CO2: 30 mEq/L (ref 19–32)
Calcium: 9 mg/dL (ref 8.4–10.5)
Chloride: 102 mEq/L (ref 96–112)
Creatinine, Ser: 0.73 mg/dL (ref 0.40–1.20)
GFR: 87.93 mL/min (ref >60.00)
Glucose, Bld: 93 mg/dL (ref 70–99)
Potassium: 4.6 mEq/L (ref 3.5–5.1)
Sodium: 139 mEq/L (ref 135–145)

## 2018-03-30 LAB — HEPATIC FUNCTION PANEL
ALT: 18 U/L (ref 0–35)
AST: 22 U/L (ref 0–37)
Albumin: 4.1 g/dL (ref 3.5–5.2)
Alkaline Phosphatase: 76 U/L (ref 39–117)
Bilirubin, Direct: 0.1 mg/dL (ref 0.0–0.3)
Total Bilirubin: 0.5 mg/dL (ref 0.2–1.2)
Total Protein: 6.8 g/dL (ref 6.0–8.3)

## 2018-03-30 LAB — CBC WITH DIFFERENTIAL/PLATELET
Basophils Absolute: 0 10*3/uL (ref 0.0–0.1)
Basophils Relative: 0.7 % (ref 0.0–3.0)
Eosinophils Absolute: 0.1 10*3/uL (ref 0.0–0.7)
Eosinophils Relative: 2 % (ref 0.0–5.0)
HCT: 39.3 % (ref 36.0–46.0)
Hemoglobin: 13.4 g/dL (ref 12.0–15.0)
Lymphocytes Relative: 27.3 % (ref 12.0–46.0)
Lymphs Abs: 1.8 10*3/uL (ref 0.7–4.0)
MCHC: 34.1 g/dL (ref 30.0–36.0)
MCV: 88.4 fl (ref 78.0–100.0)
Monocytes Absolute: 0.6 10*3/uL (ref 0.1–1.0)
Monocytes Relative: 9.3 % (ref 3.0–12.0)
Neutro Abs: 3.9 10*3/uL (ref 1.4–7.7)
Neutrophils Relative %: 60.7 % (ref 43.0–77.0)
Platelets: 283 10*3/uL (ref 150.0–400.0)
RBC: 4.45 Mil/uL (ref 3.87–5.11)
RDW: 13.6 % (ref 11.5–15.5)
WBC: 6.5 10*3/uL (ref 4.0–10.5)

## 2018-03-30 LAB — LIPID PANEL
Cholesterol: 190 mg/dL (ref 0–200)
HDL: 62.3 mg/dL (ref >39.00)
LDL Cholesterol: 113 mg/dL — ABNORMAL HIGH (ref 0–99)
NonHDL: 127.69
Total CHOL/HDL Ratio: 3
Triglycerides: 71 mg/dL (ref 0.0–149.0)
VLDL: 14.2 mg/dL (ref 0.0–40.0)

## 2018-03-30 LAB — TSH: TSH: 1.3 u[IU]/mL (ref 0.35–4.50)

## 2018-03-30 MED ORDER — METHYLPREDNISOLONE ACETATE 40 MG/ML IJ SUSP
20.0000 mg | Freq: Once | INTRAMUSCULAR | Status: AC
  Administered 2018-03-30: 20 mg via INTRA_ARTICULAR

## 2018-03-30 MED ORDER — VENLAFAXINE HCL ER 75 MG PO CP24: 75.0000 mg | ORAL_CAPSULE | Freq: Every day | ORAL | 1 refills | Status: DC

## 2018-03-30 NOTE — Progress Notes (Signed)
BH MD OP Progress Note  03/30/2018 11:25 AM [name]  MRN:  [id number]  Chief Complaint: ' I am here for follow up." Chief Complaint    Follow-up; Medication Refill     HPI: [name] is a 57 year old Caucasian female, single, employed, lives in [place], has a history of depression, anxiety, presented to the clinic today for a follow-up visit.  She today reports she is currently doing well on the current reduced dose of Effexor.  She reports her mood symptoms as stable.  She denies any significant anxiety or depressive symptoms.  She reports her appetite is good.  She reports sleep is good.  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  She reports she will soon start her new job at the end of May.  She reports she looks forward to the same.  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  She denies any other concerns today.  Discussed with patient that we can continue to taper her off of the Effexor XR, however this can be done gradually.  She would like to wait for another 2-3 months and will return to clinic then. Visit Diagnosis:    ICD-10-CM   1. MDD (major depressive disorder), recurrent, in partial remission (HCC) F33.41   2. GAD (generalized anxiety disorder) F41.1 venlafaxine XR (EFFEXOR XR) 75 MG 24 hr capsule    Past Psychiatric History: Reviewed past psychiatric history from my progress note on 01/27/2018.  Past Medical History:  Past Medical History:  Diagnosis Date  . Anxiety   . Bipolar 1 disorder (HCC)   . Depression   . Hyperlipidemia   . Hypertension     Past Surgical History:  Procedure Laterality Date  . BUNIONECTOMY Left   . WISDOM TOOTH EXTRACTION      Family Psychiatric History: Reviewed family psychiatric history from my progress note on 01/27/2018.  Family History:  Family History  Problem Relation Age  of Onset  . Arthritis Mother   . Hypertension Mother   . Hyperlipidemia Mother   . Hypothyroidism Mother   . Heart attack Father   . Bipolar disorder Brother   . Anxiety disorder Brother   . Depression Brother   . Depression Maternal Aunt     Social History: Divorced.  She is currently in a relationship with her boyfriend.  She has 2 adult children.  She lives with her mother in [place].  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 Social History   Socioeconomic History  . Marital status: Divorced    Spouse name: Not on file  . Number of children: 2  . Years of education: Not on file  . Highest education level: Some college, no degree  Occupational History  . Occupation: [employer]    Comment: full time  Social Needs  . Financial resource strain: Not hard at all  . Food insecurity:    Worry: Never true    Inability: Never true  . Transportation needs:    Medical: No    Non-medical: No  Tobacco Use  . Smoking status: Never Smoker  . Smokeless tobacco: Never Used  Substance and Sexual Activity  . Alcohol use:  No    Comment: social   . Drug use: No  . Sexual activity: Never  Lifestyle  . Physical activity:    Days per week: 3 days    Minutes per session: 30 min  . Stress: To some extent  Relationships  . Social connections:    Talks on phone: More than three times a week    Gets together: Once a week    Attends religious service: Patient refused    Active member of club or organization: Yes    Attends meetings of clubs or organizations: More than 4 times per year    Relationship status: Divorced  Other Topics Concern  . Not on file  Social History Narrative   Single.   2 children.   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    Allergies:  Allergies  Allergen Reactions  . Sulfa Antibiotics Rash    Metabolic Disorder Labs: No  results found for: HGBA1C, MPG No results found for: PROLACTIN Lab Results  Component Value Date   CHOL 190 03/30/2018   TRIG 71.0 03/30/2018   HDL 62.30 03/30/2018   CHOLHDL 3 03/30/2018   VLDL 14.2 03/30/2018   LDLCALC 113 (H) 03/30/2018   LDLCALC 94 08/12/2017   Lab Results  Component Value Date   TSH 1.30 03/30/2018   TSH 1.58 01/20/2017    Therapeutic Level Labs: No results found for: LITHIUM Lab Results  Component Value Date   VALPROATE 74 02/10/2012   VALPROATE 166 (H) 02/09/2012   No components found for:  CBMZ  Current Medications: Current Outpatient Medications  Medication Sig Dispense Refill  . Biotin 1000 MCG tablet Take by mouth.    . calcium-vitamin D (OSCAL WITH D) 500-200 MG-UNIT per tablet Take 1 tablet by mouth daily. For Vitamin D replacement and mood control. 30 tablet 0  . lisinopril (PRINIVIL,ZESTRIL) 5 MG tablet Take 1 tablet (5 mg total) by mouth daily. 30 tablet 3  . Multiple Vitamin (MULTIVITAMIN WITH MINERALS) TABS Take 1 tablet by mouth daily. For nutritional supplementation. 30 tablet 0  . venlafaxine XR (EFFEXOR XR) 75 MG 24 hr capsule Take 1 capsule (75 mg total) by mouth daily with breakfast. 90 capsule 1   No current facility-administered medications for this visit.      Musculoskeletal: Strength & Muscle Tone: within normal limits Gait & Station: normal Patient leans: N/A  Psychiatric Specialty Exam: Review of Systems  Psychiatric/Behavioral: The patient is nervous/anxious (improved).   All other systems reviewed and are negative.   Blood pressure 123/78, pulse 62, temperature 98 F (36.7 C), temperature source Oral, weight 162 lb 9.6 oz (73.8 kg), last menstrual period 05/28/2013.Body mass index is 29.74 kg/m.  General Appearance: Casual  Eye Contact:  Fair  Speech:  Normal Rate  Volume:  Normal  Mood:  Anxious  Affect:  Congruent  Thought Process:  Goal Directed and Descriptions of Associations: Intact  Orientation:  Full  (Time, Place, and Person)  Thought Content: Logical   Suicidal Thoughts:  No  Homicidal Thoughts:  No  Memory:  Immediate;   Fair Recent;   Fair Remote;   Fair  Judgement:  Fair  Insight:  Fair  Psychomotor Activity:  Normal  Concentration:  Concentration: Fair and Attention Span: Fair  Recall:  [employer] of Knowledge: Fair  Language: Fair  Akathisia:  No  Handed:  Right  AIMS (if indicated): na  Assets:  Communication Skills Desire for  Improvement Housing Social Support  ADL's:  Intact  Cognition: WNL  Sleep:  Fair   Screenings:   Assessment and Plan: Antanasia is a 57 year old Caucasian female, has a history of depression, anxiety, presented today for a follow-up visit.  Patient is tolerating the reduced dose of Effexor XR well.  She denies any significant mood symptoms.  Will continue plan as noted below.  Plan For depression Continue Effexor XR 75 mg p.o. daily Discussed with patient her dosage can be tapered down gradually.  She will return to clinic in 3 months.  For anxiety symptoms Effexor XR 75 mg p.o. daily.  Follow-up in clinic in 3 months or sooner if needed.  More than 50 % of the time was spent for psychoeducation and supportive psychotherapy and care coordination.  This note was generated in part or whole with voice recognition software. Voice recognition is usually quite accurate but there are transcription errors that can and very often do occur. I apologize for any typographical errors that were not detected and corrected.       Jomarie Longs, MD 03/31/2018, 8:28 AM

## 2018-03-31 ENCOUNTER — Encounter: Payer: Self-pay | Admitting: Psychiatry

## 2018-05-20 ENCOUNTER — Other Ambulatory Visit: Payer: Self-pay | Admitting: Family Medicine

## 2018-05-20 DIAGNOSIS — I10 Essential (primary) hypertension: Secondary | ICD-10-CM

## 2018-05-20 NOTE — Telephone Encounter (Signed)
PCP is kate.

## 2018-06-20 ENCOUNTER — Other Ambulatory Visit: Payer: Self-pay | Admitting: Primary Care

## 2018-06-20 DIAGNOSIS — I10 Essential (primary) hypertension: Secondary | ICD-10-CM

## 2018-06-21 NOTE — Telephone Encounter (Signed)
Last prescribed on 05/20/2018  Last office visit on 03/30/2018 with Dr Patsy Lager[place]

## 2018-06-21 NOTE — Telephone Encounter (Signed)
Noted. BMP from May 2019 normal after initiation of lisinopril. Refill sent to pharmacy.

## 2018-08-19 ENCOUNTER — Other Ambulatory Visit: Payer: Self-pay | Admitting: Psychiatry

## 2018-08-19 DIAGNOSIS — F411 Generalized anxiety disorder: Secondary | ICD-10-CM

## 2018-09-15 ENCOUNTER — Other Ambulatory Visit: Payer: Self-pay | Admitting: Primary Care

## 2018-09-15 DIAGNOSIS — I10 Essential (primary) hypertension: Secondary | ICD-10-CM

## 2018-12-12 ENCOUNTER — Ambulatory Visit: Payer: BC Managed Care – PPO | Admitting: Internal Medicine

## 2018-12-12 ENCOUNTER — Encounter: Payer: Self-pay | Admitting: Internal Medicine

## 2018-12-12 VITALS — BP 124/78 | Temp 98.0°F | Wt 166.0 lb

## 2018-12-12 DIAGNOSIS — J3089 Other allergic rhinitis: Secondary | ICD-10-CM | POA: Diagnosis not present

## 2018-12-12 NOTE — Patient Instructions (Signed)

## 2018-12-12 NOTE — Progress Notes (Signed)
HPI  Pt presents to the clinic today with c/o runny nose, nasal congestion and cough. She reports this started 3 days ago. She is blowing clear mucous out of her nose. The cough is productive at times of clear mucous. She denies ear pain, sore throat or shortness of breath. She denies fever, chills or body aches. She has tried Nyquil with some relief. She has not had sick contacts that she is aware of.  Review of Systems      Past Medical History:  Diagnosis Date  . Anxiety   . Bipolar 1 disorder (HCC)   . Depression   . Hyperlipidemia   . Hypertension     Family History  Problem Relation Age of Onset  . Arthritis Mother   . Hypertension Mother   . Hyperlipidemia Mother   . Hypothyroidism Mother   . Heart attack Father   . Bipolar disorder Brother   . Anxiety disorder Brother   . Depression Brother   . Depression Maternal Aunt     Social History   Socioeconomic History  . Marital status: Divorced    Spouse name: Not on file  . Number of children: 2  . Years of education: Not on file  . Highest education level: Some college, no degree  Occupational History  . Occupation: [employer]    Comment: full time  Social Needs  . Financial resource strain: Not hard at all  . Food insecurity:    Worry: Never true    Inability: Never true  . Transportation needs:    Medical: No    Non-medical: No  Tobacco Use  . Smoking status: Never Smoker  . Smokeless tobacco: Never Used  Substance and Sexual Activity  . Alcohol use: No    Comment: social   . Drug use: No  . Sexual activity: Never  Lifestyle  . Physical activity:    Days per week: 3 days    Minutes per session: 30 min  . Stress: To some extent  Relationships  . Social connections:    Talks on phone: More than three times a week    Gets together: Once a week    Attends religious service: Patient refused    Active member of club or organization: Yes    Attends meetings of clubs or organizations: More than 4  times per year    Relationship status: Divorced  . Intimate partner violence:    Fear of current or ex partner: No    Emotionally abused: No    Physically abused: No    Forced sexual activity: No  Other Topics Concern  . Not on file  Social History Narrative   Single.   2 children.   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    Allergies  Allergen Reactions  . Sulfa Antibiotics Rash     Constitutional: Denies headache, fatigue, fever or abrupt weight changes.  HEENT:  Positive runny nose, nasal congestion. Denies eye redness, eye pain, pressure behind the eyes, facial pain, ear pain, ringing in the ears, wax buildup, runny nose or sore throat. Respiratory: Positive cough. Denies difficulty breathing or shortness of breath.  Cardiovascular: Denies chest pain, chest tightness, palpitations or swelling in the hands or feet.   No other specific complaints in a complete review of systems (except as listed in HPI above).  Objective:   BP 124/78   Temp 98 F (36.7 C) (Oral)   Wt 166 lb (75.3  kg)   LMP 05/28/2013   SpO2 98%   BMI 30.36 kg/m   Wt Readings from Last 3 Encounters:  03/30/18 162 lb 8 oz (73.7 kg)  01/19/18 164 lb 8 oz (74.6 kg)  10/26/17 159 lb (72.1 kg)     General: Appears her stated age, well developed, well nourished in NAD. HEENT: Head: normal shape and size, no sinus tenderness noted;  Nose: mucosa pink and moist, septum midline; Throat/Mouth: + PND. Teeth present, mucosa erythematous and moist, no exudate noted, no lesions or ulcerations noted.  Neck: No cervical lymphadenopathy.  Cardiovascular: Normal rate and rhythm. S1,S2 noted.  No murmur, rubs or gallops noted.  Pulmonary/Chest: Normal effort and positive vesicular breath sounds. No respiratory distress. No wheezes, rales or ronchi noted.       Assessment & Plan:   Allergic Rhinitis:  Get some rest and drink plenty of water Start Zyrtec and Flonase OTC Delsym as  needed for cough  RTC as needed or if symptoms persist.   Nicki Reaper, NP

## 2018-12-13 ENCOUNTER — Encounter: Payer: Self-pay | Admitting: Internal Medicine

## 2018-12-13 ENCOUNTER — Ambulatory Visit (INDEPENDENT_AMBULATORY_CARE_PROVIDER_SITE_OTHER)
Admission: RE | Admit: 2018-12-13 | Discharge: 2018-12-13 | Disposition: A | Discharge status: Home / Self Care | Payer: BC Managed Care – PPO | Source: Ambulatory Visit | Attending: Internal Medicine | Admitting: Internal Medicine

## 2018-12-13 ENCOUNTER — Ambulatory Visit (INDEPENDENT_AMBULATORY_CARE_PROVIDER_SITE_OTHER): Payer: BC Managed Care – PPO | Admitting: Internal Medicine

## 2018-12-13 VITALS — BP 120/76 | HR 66 | Temp 98.3°F | Wt 166.0 lb

## 2018-12-13 DIAGNOSIS — R059 Cough, unspecified: Secondary | ICD-10-CM

## 2018-12-13 DIAGNOSIS — J101 Influenza due to other identified influenza virus with other respiratory manifestations: Secondary | ICD-10-CM

## 2018-12-13 DIAGNOSIS — R05 Cough: Secondary | ICD-10-CM

## 2018-12-13 DIAGNOSIS — R0989 Other specified symptoms and signs involving the circulatory and respiratory systems: Secondary | ICD-10-CM | POA: Diagnosis not present

## 2018-12-13 DIAGNOSIS — R509 Fever, unspecified: Secondary | ICD-10-CM

## 2018-12-13 DIAGNOSIS — R0981 Nasal congestion: Secondary | ICD-10-CM

## 2018-12-13 LAB — POC INFLUENZA A&B (BINAX/QUICKVUE)
Influenza A, POC: POSITIVE — AB
Influenza B, POC: NEGATIVE

## 2018-12-13 NOTE — Progress Notes (Signed)
Subjective:    Patient ID: [name], female    DOB: June 11, 1961, 58 y.o.   MRN: [id number]  HPI  Pt presents to the clinic today with c/o fever. She reports this started last night. She reports her fever was 102 last night, 99 this morning. She was seen yesterday with c/o runny nose, nasal congestion and cough. She was diagnosed with allergic rhinitis and advised to use Zyrtec, Flonase and Delsym OTC. She has been taking the medications as prescribed but reports she is concerned because she has started running a fever. She has taken Tylenol OTC with good relief.   Review of Systems      Past Medical History:  Diagnosis Date  . Anxiety   . Bipolar 1 disorder (HCC)   . Depression   . Hyperlipidemia   . Hypertension     Current Outpatient Medications  Medication Sig Dispense Refill  . Biotin 1000 MCG tablet Take by mouth.    . calcium-vitamin D (OSCAL WITH D) 500-200 MG-UNIT per tablet Take 1 tablet by mouth daily. For Vitamin D replacement and mood control. 30 tablet 0  . lisinopril (PRINIVIL,ZESTRIL) 5 MG tablet TAKE 1 TABLET BY MOUTH EVERY DAY FOR BLOOD PRESSURE 90 tablet 0  . Multiple Vitamin (MULTIVITAMIN WITH MINERALS) TABS Take 1 tablet by mouth daily. For nutritional supplementation. 30 tablet 0  . venlafaxine XR (EFFEXOR-XR) 75 MG 24 hr capsule TAKE 1 CAPSULE (75 MG TOTAL) BY MOUTH DAILY WITH BREAKFAST. 90 capsule 2   No current facility-administered medications for this visit.     Allergies  Allergen Reactions  . Sulfa Antibiotics Rash    Family History  Problem Relation Age of Onset  . Arthritis Mother   . Hypertension Mother   . Hyperlipidemia Mother   . Hypothyroidism Mother   . Heart attack Father   . Bipolar disorder Brother   . Anxiety disorder Brother   . Depression Brother   . Depression Maternal Aunt     Social History   Socioeconomic History  . Marital status: Divorced    Spouse name: Not on file  . Number of children: 2  . Years of  education: Not on file  . Highest education level: Some college, no degree  Occupational History  . Occupation: [employer]    Comment: full time  Social Needs  . Financial resource strain: Not hard at all  . Food insecurity:    Worry: Never true    Inability: Never true  . Transportation needs:    Medical: No    Non-medical: No  Tobacco Use  . Smoking status: Never Smoker  . Smokeless tobacco: Never Used  Substance and Sexual Activity  . Alcohol use: No    Comment: social   . Drug use: No  . Sexual activity: Never  Lifestyle  . Physical activity:    Days per week: 3 days    Minutes per session: 30 min  . Stress: To some extent  Relationships  . Social connections:    Talks on phone: More than three times a week    Gets together: Once a week    Attends religious service: Patient refused    Active member of club or organization: Yes    Attends meetings of clubs or organizations: More than 4 times per year    Relationship status: Divorced  . Intimate partner violence:    Fear of current or ex partner: No    Emotionally abused: No  Physically abused: No    Forced sexual activity: No  Other Topics Concern  . Not on file  Social History Narrative   Single.   2 children.   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   Enjoys going to the movies, traveling.     Constitutional: Pt reports fever. Denies malaise, fatigue, headache or abrupt weight changes.  HEENT: Pt reports runny nose, nasal congestion. Denies eye pain, eye redness, ear pain, ringing in the ears, wax buildup, bloody nose, or sore throat. Respiratory: Pt reports cough. Denies difficulty breathing, shortness of breath, or sputum production.   Cardiovascular: Denies chest pain, chest tightness, palpitations or swelling in the hands or feet.   No other specific complaints in a complete review of systems (except as listed in HPI above).  Objective:   Physical Exam   BP 120/76   Pulse 66   Temp 98.3 F (36.8 C)  (Oral)   Wt 166 lb (75.3 kg)   LMP 05/28/2013   SpO2 98%   BMI 30.36 kg/m  Wt Readings from Last 3 Encounters:  12/13/18 166 lb (75.3 kg)  12/12/18 166 lb (75.3 kg)  03/30/18 162 lb 8 oz (73.7 kg)    General: Appears her stated age, in NAD. Skin: Warm, dry and intact. No rashes noted. HEENT: Head: normal shape and size, no sinus tenderness noted;  Ears: Tm's gray and intact, normal light reflex; Nose: mucosa pink and moist, septum midline; Throat/Mouth: Teeth present, mucosa erythematous and moist, + PND, no exudate, lesions or ulcerations noted.  Neck:  No adenopathy noted.  Cardiovascular: Normal rate and rhythm. S1,S2 noted.  No murmur, rubs or gallops noted.  Pulmonary/Chest: Normal effort and positive vesicular breath sounds. No respiratory distress. No wheezes, rales or ronchi noted.   BMET    Component Value Date/Time   NA 139 03/30/2018 1041   NA 141 08/30/2012 0929   K 4.6 03/30/2018 1041   K 3.6 08/30/2012 0929   CL 102 03/30/2018 1041   CL 102 08/30/2012 0929   CO2 30 03/30/2018 1041   CO2 32 08/30/2012 0929   GLUCOSE 93 03/30/2018 1041   GLUCOSE 95 08/30/2012 0929   BUN 19 03/30/2018 1041   BUN 13 08/30/2012 0929   CREATININE 0.73 03/30/2018 1041   CREATININE 0.85 08/30/2012 0929   CALCIUM 9.0 03/30/2018 1041   CALCIUM 8.6 08/30/2012 0929   GFRNONAA >60 08/30/2012 0929   GFRAA >60 08/30/2012 0929    Lipid Panel     Component Value Date/Time   CHOL 190 03/30/2018 1041   CHOL 144 09/02/2012 0539   TRIG 71.0 03/30/2018 1041   TRIG 51 09/02/2012 0539   HDL 62.30 03/30/2018 1041   HDL 63 (H) 09/02/2012 0539   CHOLHDL 3 03/30/2018 1041   VLDL 14.2 03/30/2018 1041   VLDL 10 09/02/2012 0539   LDLCALC 113 (H) 03/30/2018 1041   LDLCALC 71 09/02/2012 0539    CBC    Component Value Date/Time   WBC 6.5 03/30/2018 1041   RBC 4.45 03/30/2018 1041   HGB 13.4 03/30/2018 1041   HGB 14.2 08/30/2012 0929   HCT 39.3 03/30/2018 1041   HCT 41.4 08/30/2012 0929     PLT 283.0 03/30/2018 1041   PLT 298 08/30/2012 0929   MCV 88.4 03/30/2018 1041   MCV 90 08/30/2012 0929   MCH 30.9 08/30/2012 0929   MCH 30.8 08/26/2012 1544   MCHC 34.1 03/30/2018 1041   RDW 13.6 03/30/2018 1041   RDW 13.3  08/30/2012 0929   LYMPHSABS 1.8 03/30/2018 1041   LYMPHSABS 1.5 02/10/2012 0645   MONOABS 0.6 03/30/2018 1041   MONOABS 0.7 02/10/2012 0645   EOSABS 0.1 03/30/2018 1041   EOSABS 0.0 02/10/2012 0645   BASOSABS 0.0 03/30/2018 1041   BASOSABS 0.0 02/10/2012 0645    Hgb A1C No results found for: HGBA1C         Assessment & Plan:   Runny Nose, Nasal Congestion, Cough, Fever:  Could still be allergy vs viral URI with cough Rapid flu: positive for A Chest xray ordered per her request although exam does not warrant this Continue Zyrtec and Flonase Continue Tylenol as needed for fever She has been having symptoms for 5 days, too far out for Tamiflu No indications for antibiotics or steroids at this time Delsym as needed for cough  Return precautions discussed Nicki Reaper, NP

## 2018-12-13 NOTE — Patient Instructions (Signed)

## 2018-12-14 NOTE — Addendum Note (Signed)
Addended by: Roena Malady on: 12/14/2018 01:36 PM   Modules accepted: Orders

## 2018-12-19 IMAGING — MG 2D DIGITAL DIAGNOSTIC UNILATERAL LEFT MAMMOGRAM WITH CAD AND ADJ
4 series · 4 of 12 positions shown · non-contrast
Comparison: Mammography 12/02/2016, 08/23/2015 and earlier.

CLINICAL DATA: Recall from screening mammography with
tomosynthesis, possible mass in the inner left breast at posterior
depth.

EXAM:
2D DIGITAL DIAGNOSTIC LEFT MAMMOGRAM WITH ADJUNCT TOMO
ULTRASOUND LEFT BREAST

[L CC]
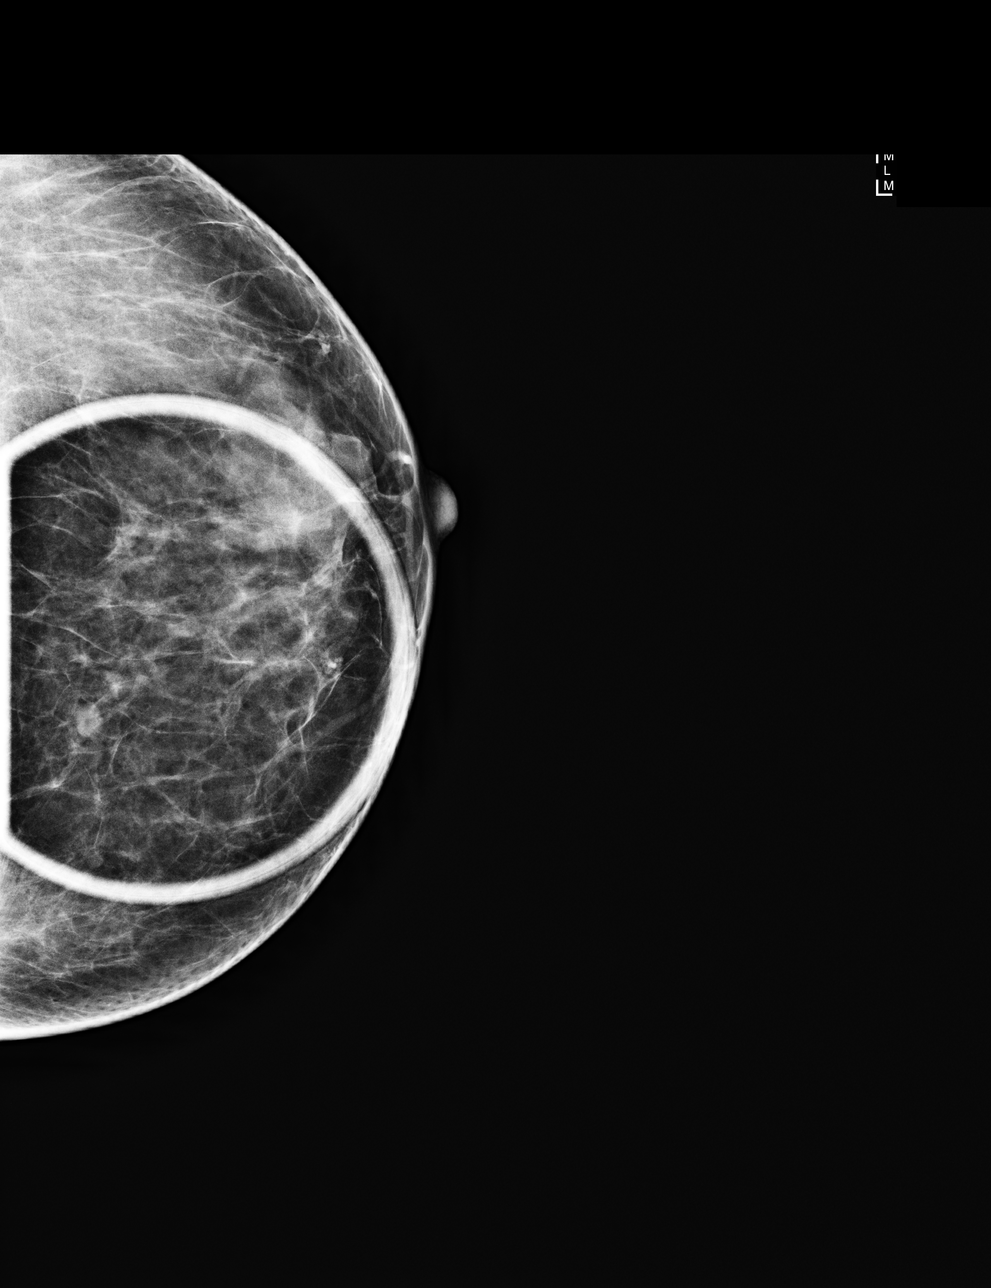

[L MLO]
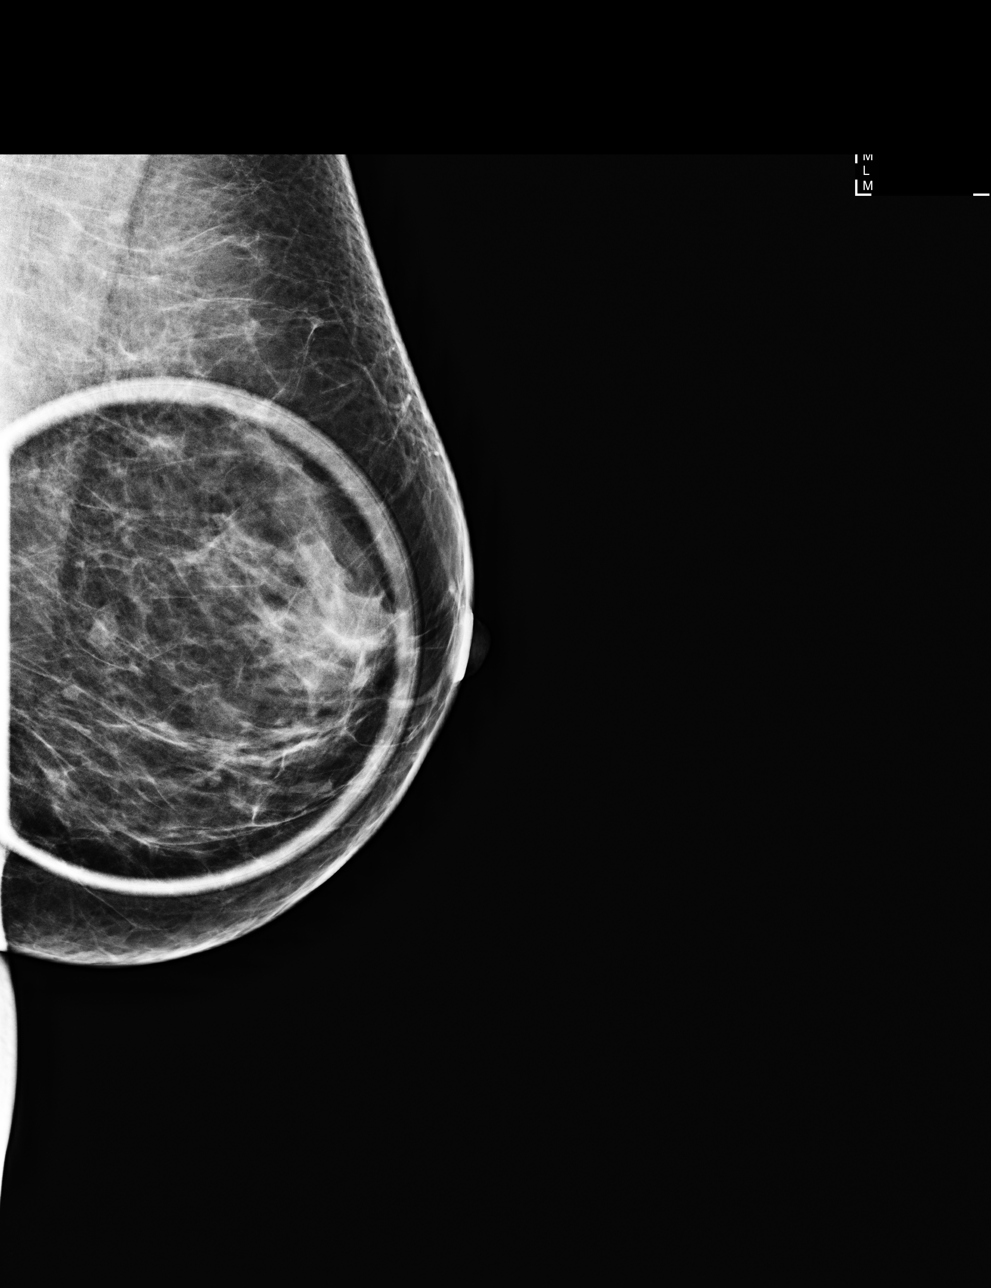

[L MLO tomo · tomo slice 32/63.0]
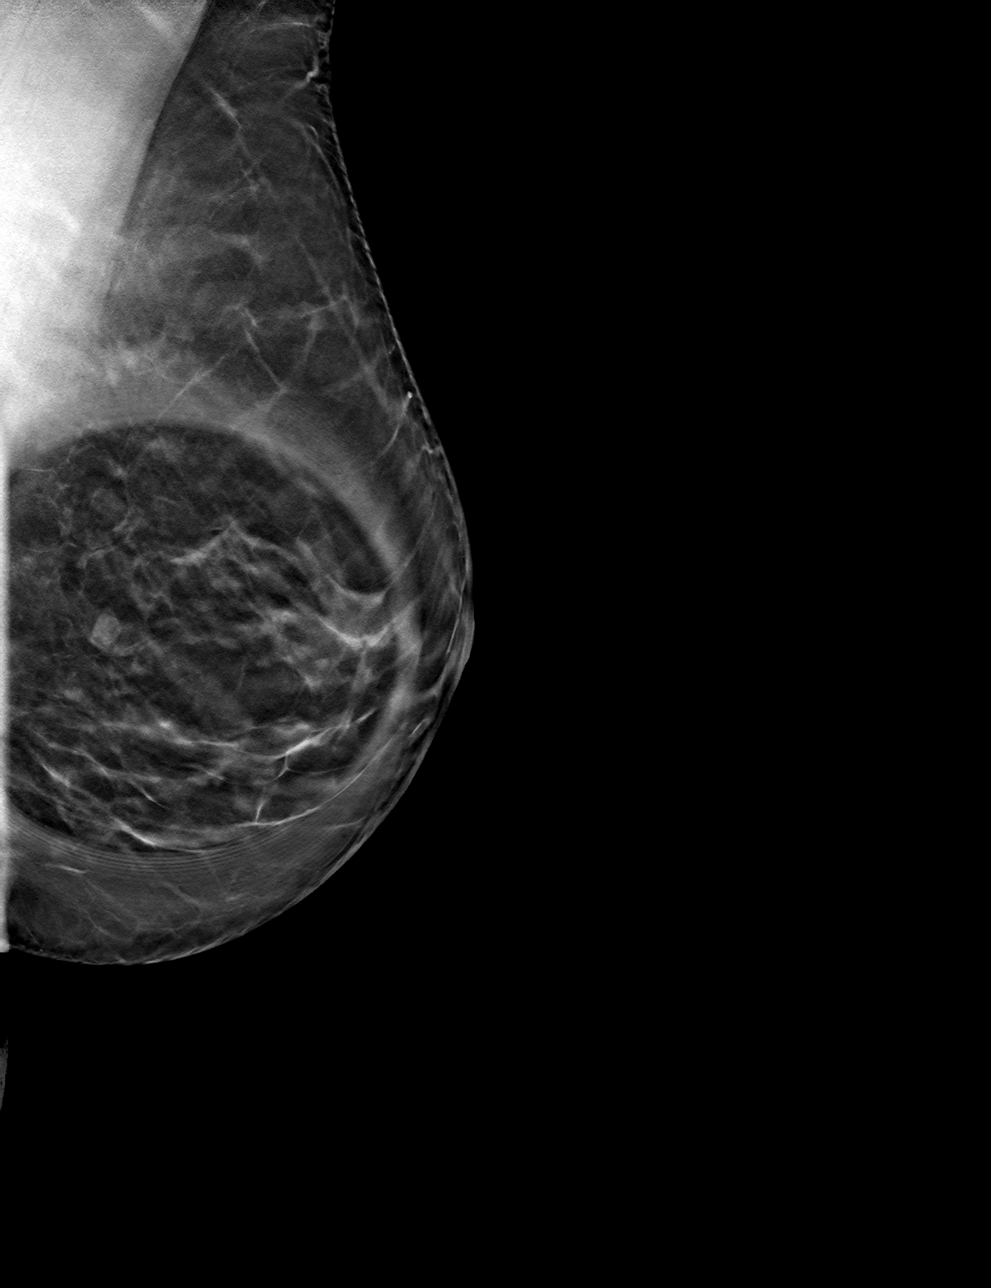

[L CC tomo · tomo slice 27/52.0]
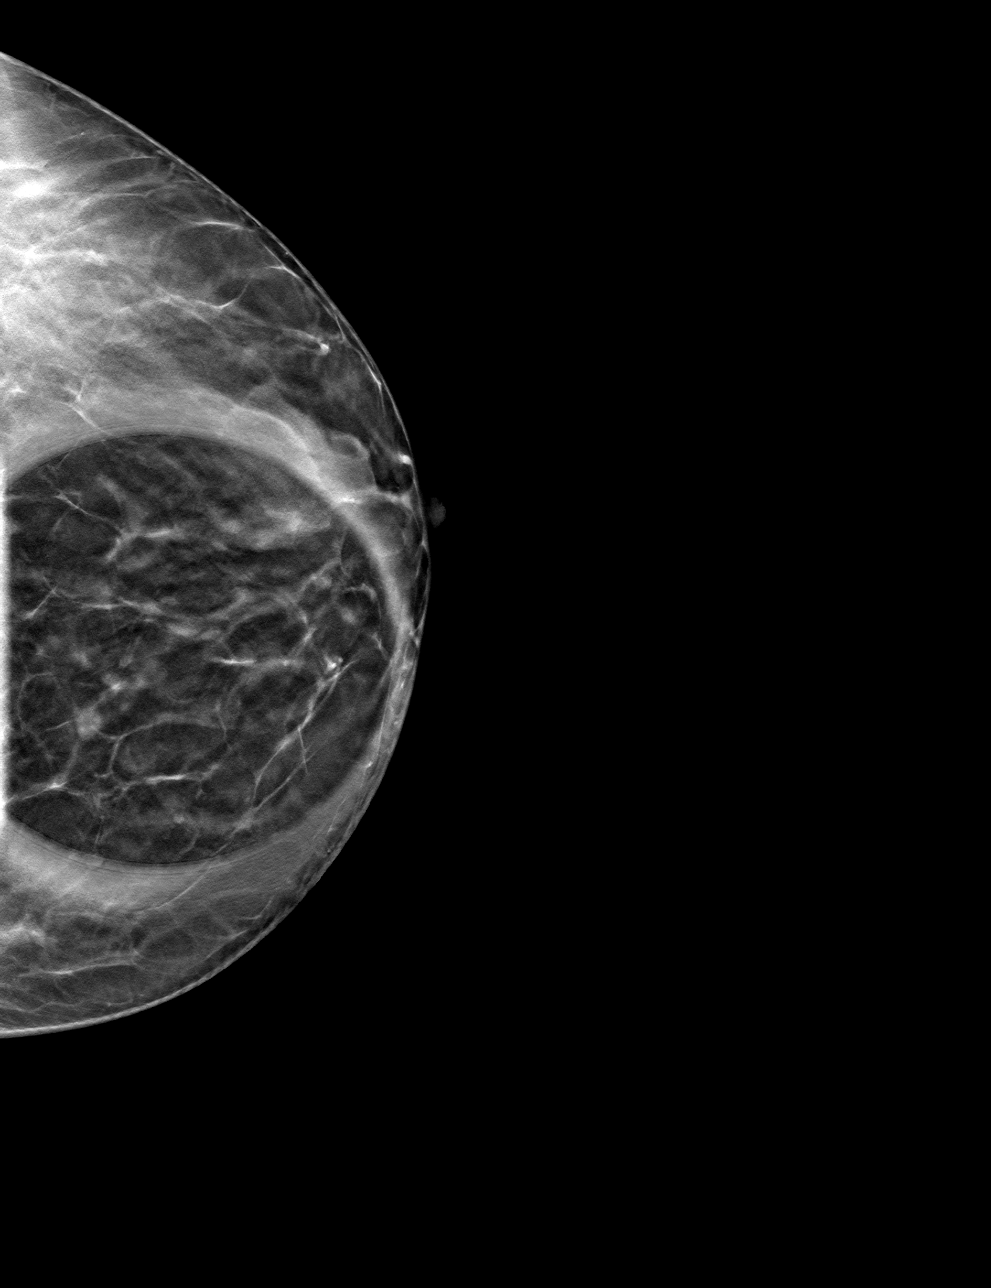

[4 of 12 positions shown; findings below may reference images not displayed]

Left
breast ultrasound 08/17/2008 was performed in a different part of
the breast.

ACR Breast Density Category b: There are scattered areas of
fibroglandular density.
FINDINGS: Standard and tomosynthesis spot-compression CC and MLO views of the
area of concern in the inner left breast at posterior depth were
obtained. These confirm a circumscribed, partially obscured mass
measuring approximately 5-6 mm without associated architectural
distortion or suspicious calcification. There may be fat within the
mass.

On physical exam, there is no palpable abnormality in the inner left
breast.

Targeted left breast ultrasound is performed, showing an oval
circumscribed multicystic mass deep at the 10 o'clock position
approximately 4 cm from the nipple measuring approximately 4 x 7 x 5
mm, demonstrating excellent acoustic enhancement and no internal
power Doppler flow, corresponding to the area of concern on
screening mammography. No suspicious solid mass or abnormal acoustic
shadowing is identified.
IMPRESSION: Benign clustered cysts/apocrine metaplasia in the upper inner left
breast at posterior depth accounting for the area of concern on
screening mammography. No mammographic or sonographic evidence of
malignancy, left breast.

RECOMMENDATION:
Screening mammogram in one year.(Code:RI-[id number])

I have discussed the findings and recommendations with the patient.
Results were also provided in writing at the conclusion of the
visit. If applicable, a reminder letter will be sent to the patient
regarding the next appointment.

BI-RADS CATEGORY  2: Benign.

## 2018-12-20 ENCOUNTER — Other Ambulatory Visit: Payer: Self-pay | Admitting: Primary Care

## 2018-12-20 DIAGNOSIS — I10 Essential (primary) hypertension: Secondary | ICD-10-CM

## 2019-02-07 ENCOUNTER — Encounter: Payer: Self-pay | Admitting: Family Medicine

## 2019-02-07 ENCOUNTER — Ambulatory Visit: Payer: BC Managed Care – PPO | Admitting: Family Medicine

## 2019-02-07 ENCOUNTER — Other Ambulatory Visit: Payer: Self-pay

## 2019-02-07 VITALS — BP 140/92 | HR 74 | Temp 98.2°F | Wt 171.1 lb

## 2019-02-07 DIAGNOSIS — R05 Cough: Secondary | ICD-10-CM

## 2019-02-07 DIAGNOSIS — R058 Other specified cough: Secondary | ICD-10-CM

## 2019-02-07 MED ORDER — BENZONATATE 200 MG PO CAPS: 200.0000 mg | ORAL_CAPSULE | Freq: Three times a day (TID) | ORAL | 1 refills | Status: DC | PRN

## 2019-02-07 MED ORDER — HYDROCODONE-HOMATROPINE 5-1.5 MG/5ML PO SYRP: 5.0000 mL | ORAL_SOLUTION | Freq: Every evening | ORAL | 0 refills | Status: DC | PRN

## 2019-02-07 NOTE — Assessment & Plan Note (Signed)
Since flu in January (also some nasal drainage) Reassuring exam  Will get more aggressive with cough - tessalon and hycodan px  Disc symptomatic care - see instructions on AVS  AVS: Drink lots of fluids  Get rest when you can  Continue the mucinex DM Add tessalon 1 pill (swallow whole) three times daily   For night time- try the hycodan (caution of sedation)    Update if not starting to improve in a week or if worsening

## 2019-02-07 NOTE — Progress Notes (Signed)
Subjective:    Patient ID: [name], female    DOB: June 10, 1961, 58 y.o.   MRN: [id number]  HPI Here with cough  58 yo pt of NP Clark   Had the flu in the end of January  Never felt 100% after that  This week she is dragging/tired and wiped out   No fever since she had the flu  No body aches or chills   Cough is ongoing- croupy sounding  occ phlegm (clear)  No wheeze or sob   Nasal drainage is constant  Also clear   No headache/facial pain  Some pressure   Otc: Took mucinex DM  Cold and flu alka selzer   Had a cxr-normal   Father died early of sarcoidosis -wanted to note that in her family hx   Patient Active Problem List   Diagnosis Date Noted  . Post-viral cough syndrome 02/07/2019  . Essential hypertension 10/26/2017  . Fatigue 09/27/2017  . 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 Preventative health care 08/12/2017  . Hyperlipidemia 01/19/2017  . Generalized anxiety disorder 08/19/2012    Class: Chronic   Past Medical History:  Diagnosis Date  . Anxiety   . Bipolar 1 disorder (HCC)   . Depression   . Hyperlipidemia   . Hypertension    Past Surgical History:  Procedure Laterality Date  . BUNIONECTOMY Left   . WISDOM TOOTH EXTRACTION     Social History   Tobacco Use  . Smoking status: Never Smoker  . Smokeless tobacco: Never Used  Substance Use Topics  . Alcohol use: No    Comment: social   . Drug use: No   Family History  Problem Relation Age of Onset  . Arthritis Mother   . Hypertension Mother   . Hyperlipidemia Mother   . Hypothyroidism Mother   . Heart attack Father   . Sarcoidosis Father        died of sarcoidosis   . Bipolar disorder Brother   . Anxiety disorder Brother   . Depression Brother   . Depression Maternal Aunt    Allergies  Allergen Reactions  . Sulfa Antibiotics Rash   Current Outpatient Medications on File Prior to Visit  Medication Sig Dispense Refill  . Biotin 1000 MCG tablet Take by mouth.    .  calcium-vitamin D (OSCAL WITH D) 500-200 MG-UNIT per tablet Take 1 tablet by mouth daily. For Vitamin D replacement and mood control. 30 tablet 0  . lisinopril (PRINIVIL,ZESTRIL) 5 MG tablet TAKE 1 TABLET BY MOUTH EVERY DAY FOR BLOOD PRESSURE 90 tablet 1  . Multiple Vitamin (MULTIVITAMIN WITH MINERALS) TABS Take 1 tablet by mouth daily. For nutritional supplementation. 30 tablet 0  . venlafaxine XR (EFFEXOR-XR) 75 MG 24 hr capsule TAKE 1 CAPSULE (75 MG TOTAL) BY MOUTH DAILY WITH BREAKFAST. 90 capsule 2   No current facility-administered medications on file prior to visit.      Review of Systems  Constitutional: Positive for appetite change and fatigue. Negative for fever.  HENT: Positive for congestion, postnasal drip, rhinorrhea, sinus pressure and sneezing. Negative for ear pain, sinus pain and sore throat.   Eyes: Negative for pain and discharge.  Respiratory: Positive for cough. Negative for shortness of breath, wheezing and stridor.   Cardiovascular: Negative for chest pain.  Gastrointestinal: Negative for diarrhea, nausea and vomiting.  Genitourinary: Negative for frequency, hematuria and urgency.  Musculoskeletal: Negative for arthralgias and myalgias.  Skin: Negative for rash.  Neurological: Positive for headaches.  Negative for dizziness, weakness and light-headedness.  Psychiatric/Behavioral: Negative for confusion and dysphoric mood.       Objective:   Physical Exam Constitutional:      General: She is not in acute distress.    Appearance: Normal appearance. She is well-developed. She is not ill-appearing, toxic-appearing or diaphoretic.  HENT:     Head: Normocephalic and atraumatic.     Comments: Nares are injected and congested      Right Ear: Tympanic membrane, ear canal and external ear normal.     Left Ear: Tympanic membrane, ear canal and external ear normal.     Nose: Congestion and rhinorrhea present.     Mouth/Throat:     Mouth: Mucous membranes are moist.      Pharynx: Oropharynx is clear. No oropharyngeal exudate or posterior oropharyngeal erythema.     Comments: Clear pnd  Eyes:     General:        Right eye: No discharge.        Left eye: No discharge.     Conjunctiva/sclera: Conjunctivae normal.     Pupils: Pupils are equal, round, and reactive to light.  Neck:     Musculoskeletal: Normal range of motion and neck supple.  Cardiovascular:     Rate and Rhythm: Normal rate.     Heart sounds: Normal heart sounds.  Pulmonary:     Effort: Pulmonary effort is normal. No respiratory distress.     Breath sounds: Normal breath sounds. No stridor. No wheezing, rhonchi or rales.     Comments: Good air exch  Dry hacking cough  No rales/rhonchi No wheeze or stridor  Chest:     Chest wall: No tenderness.  Lymphadenopathy:     Cervical: No cervical adenopathy.  Skin:    General: Skin is warm and dry.     Capillary Refill: Capillary refill takes less than 2 seconds.     Findings: No rash.  Neurological:     Mental Status: She is alert.     Cranial Nerves: No cranial nerve deficit.  Psychiatric:        Mood and Affect: Mood normal.           Assessment & Plan:   Problem List Items Addressed This Visit      Respiratory   Post-viral cough syndrome - Primary    Since flu in January (also some nasal drainage) Reassuring exam  Will get more aggressive with cough - tessalon and hycodan px  Disc symptomatic care - see instructions on AVS  AVS: Drink lots of fluids  Get rest when you can  Continue the mucinex DM Add tessalon 1 pill (swallow whole) three times daily   For night time- try the hycodan (caution of sedation)    Update if not starting to improve in a week or if worsening

## 2019-02-07 NOTE — Patient Instructions (Signed)
Drink lots of fluids  Get rest when you can  Continue the mucinex DM Add tessalon 1 pill (swallow whole) three times daily   For night time- try the hycodan (caution of sedation)    Update if not starting to improve in a week or if worsening

## 2019-05-13 ENCOUNTER — Other Ambulatory Visit: Payer: Self-pay | Admitting: Psychiatry

## 2019-05-13 DIAGNOSIS — F411 Generalized anxiety disorder: Secondary | ICD-10-CM

## 2019-05-18 ENCOUNTER — Telehealth: Payer: Self-pay | Admitting: Primary Care

## 2019-05-18 DIAGNOSIS — F411 Generalized anxiety disorder: Secondary | ICD-10-CM

## 2019-05-18 MED ORDER — VENLAFAXINE HCL ER 75 MG PO CP24: 75.0000 mg | ORAL_CAPSULE | Freq: Every day | ORAL | 0 refills | Status: DC

## 2019-05-18 NOTE — Telephone Encounter (Signed)
Noted.  Please notify patient that I sent a refill of her venlafaxine to the pharmacy. We will talk on June 30th as scheduled.

## 2019-05-18 NOTE — Telephone Encounter (Signed)
bestnumber [phone] Pt called schedule appointment 6/30 to get referral to psychiatrist.   She needs refill on venlafaxine and wanted to see if you will refill Today is her 2nd day of not taking her meds    [facility]

## 2019-05-19 NOTE — Telephone Encounter (Signed)
Message left for patient to return my call.  

## 2019-05-19 NOTE — Telephone Encounter (Signed)
Spoken and notified patient of [name] Clark's comments. Patient verbalized understanding.  

## 2019-05-23 ENCOUNTER — Ambulatory Visit (INDEPENDENT_AMBULATORY_CARE_PROVIDER_SITE_OTHER): Payer: BC Managed Care – PPO | Admitting: Primary Care

## 2019-05-23 ENCOUNTER — Other Ambulatory Visit: Payer: Self-pay

## 2019-05-23 ENCOUNTER — Telehealth: Payer: Self-pay

## 2019-05-23 DIAGNOSIS — F411 Generalized anxiety disorder: Secondary | ICD-10-CM | POA: Diagnosis not present

## 2019-05-23 DIAGNOSIS — Z20822 Contact with and (suspected) exposure to covid-19: Secondary | ICD-10-CM

## 2019-05-23 NOTE — Assessment & Plan Note (Signed)
Doing well on current regimen of venlafaxine. Also wanting to be referred to psychiatry as it seems that her previous psychiatrist will no longer be working with her. Referral placed as requested. She has a 90 day supply of venlafaxine which should be enough to get her to her new appointment.

## 2019-05-23 NOTE — Patient Instructions (Signed)
You will be contacted regarding your referral to psyhicatry.  Please let us know if you have not been contacted within one-two weeks.   You will be contacted regarding Covid testing.  It was a pleasure to see you today!

## 2019-05-23 NOTE — Progress Notes (Signed)
Subjective:    Patient ID: [name], female    DOB: 1961/09/21, 59 y.o.   MRN: [id number]  HPI  Virtual Visit via Video Note  I connected with Konrad Felix on 05/23/19 at 12:00 PM EDT by a video enabled telemedicine application and verified that I am speaking with the correct person using two identifiers.  Location: Patient: Home Provider: Office   I discussed the limitations of evaluation and management by telemedicine and the availability of in person appointments. The patient expressed understanding and agreed to proceed.  History of Present Illness:  [name] is a 57 year old female with a history of anxiety disorder and fatigue who presents today requesting psychiatry referral. She would also like to be tested for Covid 19.  She is currently managed on venlafaxine ER 75 mg for which she was getting from Dr. Gretel Acre, psychiatry. She's not seen Dr. Gretel Acre since May of 2019, also had two "no show" appointments so she was told to re-establish with another psychiatrist within the same practice. She denies SI/HI. She has plenty of venlafaxine to get her until her through for now.   She has no symptoms but would like to be tested for Covid. She has a new grandchild and would like to see him but would like to be tested first. She denies known exposure to Covid-19, SOB, new cough, fatigue, chills.    Observations/Objective:  Alert and oriented. Appears well, not sickly. No distress. Speaking in complete sentences.   Assessment and Plan:  Doing well on current regimen of venlafaxine. Also wanting to be referred to psychiatry as it seems that her previous psychiatrist will no longer be working with her. Referral placed as requested. She has a 90 day supply of venlafaxine which should be enough to get her to her new appointment.  Will send message to [facility] regarding Covid testing.  Follow Up Instructions:  You will be contacted regarding your referral to psyhicatry.   Please let us know if you have not been contacted within one-two weeks.   You will be contacted regarding Covid testing.  It was a pleasure to see you today!    I discussed the assessment and treatment plan with the patient. The patient was provided an opportunity to ask questions and all were answered. The patient agreed with the plan and demonstrated an understanding of the instructions.   The patient was advised to call back or seek an in-person evaluation if the symptoms worsen or if the condition fails to improve as anticipated.     Pleas Koch, NP    Review of Systems  Constitutional: Negative for chills and fever.  HENT: Negative for congestion.   Respiratory: Negative for cough and shortness of breath.   Psychiatric/Behavioral:       See HPI       Past Medical History:  Diagnosis Date  . Anxiety   . Bipolar 1 disorder ([place])   . Depression   . Hyperlipidemia   . Hypertension      Social History   Socioeconomic History  . Marital status: Divorced    Spouse name: Not on file  . Number of children: 2  . Years of education: Not on file  . Highest education level: Some college, no degree  Occupational History  . Occupation: [employer]    Comment: full time  Social Needs  . Financial resource strain: Not hard at all  . Food insecurity    Worry: Never  true    Inability: Never true  . Transportation needs    Medical: No    Non-medical: No  Tobacco Use  . Smoking status: Never Smoker  . Smokeless tobacco: Never Used  Substance and Sexual Activity  . Alcohol use: No    Comment: social   . Drug use: No  . Sexual activity: Never  Lifestyle  . Physical activity    Days per week: 3 days    Minutes per session: 30 min  . Stress: To some extent  Relationships  . Social connections    Talks on phone: More than three times a week    Gets together: Once a week    Attends religious service: Patient refused    Active member of club or organization: Yes     Attends meetings of clubs or organizations: More than 4 times per year    Relationship status: Divorced  . Intimate partner violence    Fear of current or ex partner: No    Emotionally abused: No    Physically abused: No    Forced sexual activity: No  Other Topics Concern  . Not on file  Social History Narrative   Single.   2 children.   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   Enjoys going to the movies, traveling.    Past Surgical History:  Procedure Laterality Date  . BUNIONECTOMY Left   . WISDOM TOOTH EXTRACTION      Family History  Problem Relation Age of Onset  . Arthritis Mother   . Hypertension Mother   . Hyperlipidemia Mother   . Hypothyroidism Mother   . Heart attack Father   . Sarcoidosis Father        died of sarcoidosis   . Bipolar disorder Brother   . Anxiety disorder Brother   . Depression Brother   . Depression Maternal Aunt     Allergies  Allergen Reactions  . Sulfa Antibiotics Rash    Current Outpatient Medications on File Prior to Visit  Medication Sig Dispense Refill  . benzonatate (TESSALON) 200 MG capsule Take 1 capsule (200 mg total) by mouth 3 (three) times daily as needed. Do not bite the pill 30 capsule 1  . Biotin 1000 MCG tablet Take by mouth.    . calcium-vitamin D (OSCAL WITH D) 500-200 MG-UNIT per tablet Take 1 tablet by mouth daily. For Vitamin D replacement and mood control. 30 tablet 0  . HYDROcodone-homatropine (HYCODAN) 5-1.5 MG/5ML syrup Take 5 mLs by mouth at bedtime as needed for cough. 60 mL 0  . lisinopril (PRINIVIL,ZESTRIL) 5 MG tablet TAKE 1 TABLET BY MOUTH EVERY DAY FOR BLOOD PRESSURE 90 tablet 1  . Multiple Vitamin (MULTIVITAMIN WITH MINERALS) TABS Take 1 tablet by mouth daily. For nutritional supplementation. 30 tablet 0  . venlafaxine XR (EFFEXOR-XR) 75 MG 24 hr capsule Take 1 capsule (75 mg total) by mouth daily with breakfast. 90 capsule 0   No current facility-administered medications on file prior to visit.     LMP  05/28/2013    Objective:   Physical Exam  Constitutional: She is oriented to person, place, and time. She appears well-nourished. She does not have a sickly appearance. She does not appear ill.  Respiratory: Effort normal. No respiratory distress.  Neurological: She is alert and oriented to person, place, and time.  Psychiatric: She has a normal mood and affect.           Assessment & Plan:

## 2019-05-23 NOTE — Telephone Encounter (Addendum)
Patient called, left VM to return the call to [phone] between the hours 0700-1900 Monday through Friday to schedule covid testing.    ----- Message from Pleas Koch, NP sent at 05/23/2019 12:54 PM EDT ----- Regarding: Covid Testing Good afternoon!  This patient would like to see her new grandson but would like to be tested for Covid first. She has no symptoms and has had no known exposure to Covid. She does go to work daily.  Thanks! Allie Bossier, NP-C

## 2019-05-24 NOTE — Telephone Encounter (Signed)
Please call patient back to schedule Covid-19 testing before 7pm tonight.  She works all day.  So there is no way to reach her during the day.

## 2019-05-24 NOTE — Telephone Encounter (Signed)
Patient called, left VM to return the call to [phone] between the hours 0700-1900 Monday through Friday to schedule covid testing.

## 2019-05-25 ENCOUNTER — Other Ambulatory Visit: Payer: Self-pay

## 2019-05-25 DIAGNOSIS — Z20822 Contact with and (suspected) exposure to covid-19: Secondary | ICD-10-CM

## 2019-05-25 NOTE — Addendum Note (Signed)
Addended by: Denyce Robert on: 05/25/2019 09:14 AM   Modules accepted: Orders

## 2019-05-25 NOTE — Telephone Encounter (Signed)
Spoke with patient.  Scheduled her for COVID 19 test today at 12:30 pm at [facility].  Testing protocol reviewed.

## 2019-05-31 LAB — NOVEL CORONAVIRUS, NAA: SARS-CoV-2, NAA: NOT DETECTED

## 2019-06-01 ENCOUNTER — Other Ambulatory Visit: Payer: Self-pay | Admitting: Primary Care

## 2019-06-01 DIAGNOSIS — F411 Generalized anxiety disorder: Secondary | ICD-10-CM

## 2019-06-13 ENCOUNTER — Other Ambulatory Visit: Payer: Self-pay | Admitting: Primary Care

## 2019-06-13 DIAGNOSIS — I10 Essential (primary) hypertension: Secondary | ICD-10-CM

## 2019-06-19 ENCOUNTER — Other Ambulatory Visit: Payer: Self-pay

## 2019-06-19 ENCOUNTER — Telehealth: Payer: Self-pay

## 2019-06-19 DIAGNOSIS — Z20822 Contact with and (suspected) exposure to covid-19: Secondary | ICD-10-CM

## 2019-06-19 NOTE — Telephone Encounter (Signed)
It appears patient was tested on 06/19/19.

## 2019-06-19 NOTE — Telephone Encounter (Addendum)
Pt left v/m; pt works at [employer]	[place] and a coworker has tested positive for covid and pt wants covid testing.pt does not always wear mask at work. Pt has prod cough with clear phlegm for 1 month and since 06/17/19 pt has head congestion.no other covoid symptoms and pt has not traveled. Pt request cb with testing info.

## 2019-06-22 LAB — NOVEL CORONAVIRUS, NAA: SARS-CoV-2, NAA: NOT DETECTED

## 2019-08-20 ENCOUNTER — Other Ambulatory Visit: Payer: Self-pay | Admitting: Primary Care

## 2019-08-20 DIAGNOSIS — F411 Generalized anxiety disorder: Secondary | ICD-10-CM

## 2019-09-06 LAB — HM DEXA SCAN

## 2019-09-06 LAB — HM MAMMOGRAPHY

## 2019-09-16 ENCOUNTER — Other Ambulatory Visit: Payer: Self-pay | Admitting: Primary Care

## 2019-09-16 DIAGNOSIS — I10 Essential (primary) hypertension: Secondary | ICD-10-CM

## 2019-10-13 ENCOUNTER — Other Ambulatory Visit: Payer: Self-pay

## 2019-10-13 ENCOUNTER — Other Ambulatory Visit: Payer: Self-pay | Admitting: Family Medicine

## 2019-10-13 ENCOUNTER — Ambulatory Visit: Payer: Self-pay | Admitting: Family Medicine

## 2019-10-13 ENCOUNTER — Encounter: Payer: Self-pay | Admitting: Family Medicine

## 2019-10-13 VITALS — BP 137/84 | HR 52 | Temp 98.1°F | Resp 16 | Ht 62.0 in | Wt 160.0 lb

## 2019-10-13 DIAGNOSIS — R030 Elevated blood-pressure reading, without diagnosis of hypertension: Secondary | ICD-10-CM

## 2019-10-13 DIAGNOSIS — Z114 Encounter for screening for human immunodeficiency virus [HIV]: Secondary | ICD-10-CM

## 2019-10-13 DIAGNOSIS — F3342 Major depressive disorder, recurrent, in full remission: Secondary | ICD-10-CM

## 2019-10-13 DIAGNOSIS — E663 Overweight: Secondary | ICD-10-CM

## 2019-10-13 DIAGNOSIS — R7309 Other abnormal glucose: Secondary | ICD-10-CM

## 2019-10-13 DIAGNOSIS — E669 Obesity, unspecified: Secondary | ICD-10-CM | POA: Insufficient documentation

## 2019-10-13 DIAGNOSIS — Z7689 Persons encountering health services in other specified circumstances: Secondary | ICD-10-CM

## 2019-10-13 DIAGNOSIS — Z0000 Encounter for general adult medical examination without abnormal findings: Secondary | ICD-10-CM

## 2019-10-13 DIAGNOSIS — Z1159 Encounter for screening for other viral diseases: Secondary | ICD-10-CM

## 2019-10-13 DIAGNOSIS — E7800 Pure hypercholesterolemia, unspecified: Secondary | ICD-10-CM

## 2019-10-13 NOTE — Assessment & Plan Note (Signed)
Encourage weight loss lifestyle diet

## 2019-10-13 NOTE — Assessment & Plan Note (Signed)
Due for lipid panel upcoming physical fam history high risk CAD MI age 56 in father

## 2019-10-13 NOTE — Patient Instructions (Addendum)
Thank you for coming to the office today.  Follow up with Dr Nicolasa Ducking as planned, have her send [place] a record  Track down Colonoscopy 2015 if you can, need actual report with result, or if cant find you can send message to [place] or call with name location and doctor if you have it of the place that did it.  Keep an eye on BP between now and next visit.  DUE for FASTING BLOOD WORK (no food or drink after midnight before the lab appointment, only water or coffee without cream/sugar on the morning of)  SCHEDULE "Lab Only" visit in the morning at the clinic for lab draw in 2 MONTHS   - Make sure Lab Only appointment is at about 1 week before your next appointment, so that results will be available  For Lab Results, once available within 2-3 days of blood draw, you can can log in to MyChart online to view your results and a brief explanation. Also, we can discuss results at next follow-up visit.   Please schedule a Follow-up Appointment to: Return in about 2 months (around 12/13/2019) for Annual Physical.  If you have any other questions or concerns, please feel free to call the office or send a message through [place]. You may also schedule an earlier appointment if necessary.  Additionally, you may be receiving a survey about your experience at our office within a few days to 1 week by e-mail or mail. We value your feedback.  Nobie Putnam, DO [place]

## 2019-10-13 NOTE — Progress Notes (Signed)
Subjective:    Patient ID: [name], female    DOB: 11-Aug-1961, 58 y.o.   MRN: [id number]  [name] is a 58 y.o. female presenting [name] 10/13/2019 for Establish Care (hyperlipidemia)   HPI   Hyperlipidemia / History of mild elevated BP Question if dx HTN in past. She was on Lisinopril 5mg  but has come off of this, now no medication, has had normal range BP she can check BP outside office but no readings today. Denies CP, dyspnea, HA, edema, dizziness / lightheadedness Family history of heart disease, father MI at 2440 Lifestyle - Lost 7 lbs in few months - Not meeting goal for exercising but will work on it  Major Depression, chronic recurrent - currently in remission Prior history on chart possible Bipolar, mood disorder Previous with Dr Hoover BrunetteEappen [facility], now will return to Psychiatry Dr Caryn SectionAarti Kapur  Off medicine, Venlafaxine doing well off med. Awaiting next apt.  Osteopenia Last DEXA 09/06/19, T score -1.8 to 1.1, L spine and L femoral neck Takes calcium Vit D supplement Next due in 2 years. No fracture  Additional complaint Knee pain, stiffness - Has apt with arthritis specialist soon in [place] Dr Corliss Skainseveshwar  Lumbar spine OA/DJD, Spinal stenosis, spondylolisthesis grade 4 Prior MRI imaging done 2018 Chronic problem Currently without flare up but she plans to return to spine specialist [facility] future [facility], [facility]  Health Maintenance:  Colonoscopy 2015, no polyps good [place] 10 year, [place].  Pap smear, last done 2019 from [facility] [place], request record. No abnormal   Depression screen PHQ 2/9 10/13/2019  Decreased Interest 0  Down, Depressed, Hopeless 0  PHQ - 2 Score 0  Altered sleeping 0  Tired, decreased energy 0  Change in appetite 0  Feeling bad or failure about yourself  0  Trouble concentrating 0  Moving slowly or fidgety/restless 0  Suicidal thoughts 0  PHQ-9 Score 0  Difficult doing work/chores Not difficult  at all     GAD 7 : Generalized Anxiety Score 10/13/2019  Nervous, Anxious, on Edge 0  Control/stop worrying 0  Worry too much - different things 0  Trouble relaxing 0  Restless 0  Easily annoyed or irritable 0  Afraid - awful might happen 0  Total GAD 7 Score 0  Anxiety Difficulty Not difficult at all     Past Medical History:  Diagnosis Date  . Anxiety   . Hyperlipidemia    Past Surgical History:  Procedure Laterality Date  . BUNIONECTOMY Left   . WISDOM TOOTH EXTRACTION     Social History   Socioeconomic History  . Marital status: Divorced    Spouse name: Not on file  . Number of children: 2  . Years of education: Not on file  . Highest education level: Some college, no degree  Occupational History  . Occupation: [employer]    Comment: full time  Social Needs  . Financial resource strain: Not hard at all  . Food insecurity    Worry: Never true    Inability: Never true  . Transportation needs    Medical: No    Non-medical: No  Tobacco Use  . Smoking status: Never Smoker  . Smokeless tobacco: Never Used  Substance and Sexual Activity  . Alcohol use: No    Comment: social   . Drug use: No  . Sexual activity: Never  Lifestyle  . Physical activity    Days per week: 3  days    Minutes per session: 30 min  . Stress: To some extent  Relationships  . Social connections    Talks on phone: More than three times a week    Gets together: Once a week    Attends religious service: Patient refused    Active member of club or organization: Yes    Attends meetings of clubs or organizations: More than 4 times per year    Relationship status: Divorced  . Intimate partner violence    Fear of current or ex partner: No    Emotionally abused: No    Physically abused: No    Forced sexual activity: No  Other Topics Concern  . Not on file  Social History Narrative   Single.   2 children.   Works as Quarry manager at [employer].   Enjoys going to the movies, traveling.    Family History  Problem Relation Age of Onset  . Arthritis Mother   . Hypertension Mother   . Hyperlipidemia Mother   . Hypothyroidism Mother   . Heart attack Father 24  . Sarcoidosis Father        died of sarcoidosis   . Bipolar disorder Brother   . Anxiety disorder Brother   . Depression Brother   . Depression Maternal Aunt    Current Outpatient Medications on File Prior to Visit  Medication Sig  . Biotin 1000 MCG tablet Take by mouth.  . calcium-vitamin D (OSCAL WITH D) 500-200 MG-UNIT per tablet Take 1 tablet by mouth daily. For Vitamin D replacement and mood control.  . Multiple Vitamin (MULTIVITAMIN WITH MINERALS) TABS Take 1 tablet by mouth daily. For nutritional supplementation.   No current facility-administered medications on file prior to visit.     Review of Systems Per HPI unless specifically indicated above    Objective:    BP 137/84   Pulse (!) 52   Temp 98.1 F (36.7 C) (Oral)   Resp 16   Ht 5\' 2"  (1.575 m)   Wt 160 lb (72.6 kg)   LMP 05/28/2013   BMI 29.26 kg/m   Wt Readings from Last 3 Encounters:  10/13/19 160 lb (72.6 kg)  02/07/19 171 lb 1 oz (77.6 kg)  12/13/18 166 lb (75.3 kg)    Physical Exam Vitals signs and nursing note reviewed.  Constitutional:      General: She is not in acute distress.    Appearance: She is well-developed. She is not diaphoretic.     Comments: Well-appearing, comfortable, cooperative  HENT:     Head: Normocephalic and atraumatic.  Eyes:     General:        Right eye: No discharge.        Left eye: No discharge.     Conjunctiva/sclera: Conjunctivae normal.  Cardiovascular:     Rate and Rhythm: Normal rate.  Pulmonary:     Effort: Pulmonary effort is normal.  Skin:    General: Skin is warm and dry.     Findings: No erythema or rash.  Neurological:     Mental Status: She is alert and oriented to person, place, and time.  Psychiatric:        Behavior: Behavior normal.     Comments: Well groomed, good eye  contact, normal speech and thoughts      I have personally reviewed the radiology report from 09/2017  CLINICAL DATA:  Lumbar spondylolisthesis.  Chronic low back pain  EXAM: MRI LUMBAR SPINE WITHOUT CONTRAST  TECHNIQUE:  Multiplanar, multisequence MR imaging of the lumbar spine was performed. No intravenous contrast was administered.  COMPARISON:  None.  FINDINGS: Segmentation: Normal segmentation. Lowest disc space is assigned L5-S1.  Alignment: Grade 2 anterolisthesis L4-5 measuring 14 mm. Remaining alignment normal  Vertebrae: Negative for fracture or mass. Small hemangioma T11 vertebral body  Conus medullaris and cauda equina: Conus extends to the L1 level. Conus and cauda equina appear normal.  Paraspinal and other soft tissues: 2 cm right renal cyst. No retroperitoneal mass  Disc levels:  T12-L1:  Negative  L1-2:  Negative  L2-3:  Mild disc space narrowing without stenosis  L3-4:  Mild disc degeneration and disc bulging without stenosis  L4-5: 14 mm anterolisthesis. Extensive posterior element bony overgrowth is present. Due to extensive bony overgrowth, it is difficult determine if there are pars defects of L4 versus severe facet degeneration allowing for slip. Severe spinal stenosis. Severe foraminal encroachment bilaterally with impingement and flattening of the L4 nerve root bilaterally  L5-S1: Negative  IMPRESSION: Grade 2 slip L4-5. It is difficult on this study determine if there are bilateral pars defects of L4, CT is suggested for further evaluation. There is severe spinal stenosis and severe foraminal encroachment bilaterally at L4-5.   Electronically Signed   By: Marlan Palau M.D.   On: 10/11/2017 10:39  Results for orders placed or performed in visit on 10/13/19  HM MAMMOGRAPHY  Result Value Ref Range   HM Mammogram 0-4 Bi-Rad 0-4 Bi-Rad, Self Reported Normal  HM DEXA SCAN  Result Value Ref Range   HM Dexa Scan  Osteopenia T -1.8 (L-spine), Femoral neck L (-1.1)       Assessment & Plan:   Problem List Items Addressed This Visit    Overweight (BMI 25.0-29.9)    Encourage weight loss lifestyle diet      Major depression, recurrent, full remission (HCC) - Primary    Currently depression is in remission, seems chronic recurrent problem previous OFF medication Venlafaxine now Has upcoming f/u with Dr Maryruth Bun Psychiatry - for evaluation and monitoring      Hyperlipidemia    Due for lipid panel upcoming physical fam history high risk CAD MI age 21 in father      Elevated BP without diagnosis of hypertension    Currently normal BP History of elevated Off low dose lisinopril now  Plan Closely monitor BP outside office Check upcoming labs Re-discuss options if elevated BP persistently       Other Visit Diagnoses    Encounter to establish care with new doctor        Request outside records from her G[facility], including last pap smear  Additional request for Colonoscopy from 2015, unsure which location she will send [place] that information.   No orders of the defined types were placed in this encounter.    Follow up plan: Return in about 2 months (around 12/13/2019) for Annual Physical.   Future labs ordered for 12/06/19 - TSH, CBC, Lipid, A1c, CMET, Hep C, HIV    Saralyn Pilar, DO [facility] 10/13/2019, 10:35 AM

## 2019-10-13 NOTE — Assessment & Plan Note (Signed)
Currently normal BP History of elevated Off low dose lisinopril now  Plan Closely monitor BP outside office Check upcoming labs Re-discuss options if elevated BP persistently

## 2019-10-13 NOTE — Assessment & Plan Note (Signed)
Currently depression is in remission, seems chronic recurrent problem previous OFF medication Venlafaxine now Has upcoming f/u with Dr Nicolasa Ducking Psychiatry - for evaluation and monitoring

## 2019-12-04 ENCOUNTER — Ambulatory Visit: Payer: BC Managed Care – PPO | Admitting: Rheumatology

## 2019-12-06 ENCOUNTER — Other Ambulatory Visit: Payer: Self-pay

## 2019-12-13 ENCOUNTER — Encounter: Payer: Self-pay | Admitting: Family Medicine

## 2019-12-25 ENCOUNTER — Ambulatory Visit: Payer: BC Managed Care – PPO | Admitting: Rheumatology

## 2019-12-26 ENCOUNTER — Encounter: Payer: Self-pay | Admitting: Family Medicine

## 2020-02-06 ENCOUNTER — Other Ambulatory Visit: Payer: BC Managed Care – PPO

## 2020-02-14 ENCOUNTER — Encounter: Payer: Self-pay | Admitting: Family Medicine

## 2020-02-28 ENCOUNTER — Ambulatory Visit: Payer: BC Managed Care – PPO | Admitting: Family Medicine

## 2020-02-28 ENCOUNTER — Other Ambulatory Visit: Payer: Self-pay

## 2020-02-28 ENCOUNTER — Encounter: Payer: Self-pay | Admitting: Family Medicine

## 2020-02-28 VITALS — BP 111/60 | HR 68 | Temp 97.8°F | Ht 63.0 in | Wt 160.0 lb

## 2020-02-28 DIAGNOSIS — I10 Essential (primary) hypertension: Secondary | ICD-10-CM | POA: Diagnosis not present

## 2020-02-28 DIAGNOSIS — F3342 Major depressive disorder, recurrent, in full remission: Secondary | ICD-10-CM

## 2020-02-28 MED ORDER — LISINOPRIL 5 MG PO TABS: 5.0000 mg | ORAL_TABLET | Freq: Every day | ORAL | 3 refills | Status: DC

## 2020-02-28 NOTE — Patient Instructions (Addendum)
Thank you for coming to the office today.  Refilled Lisiniopril 5mg  - new medication should work better than the old one. If need after 1-2 weeks, can double up dose for 5 mg x 2 = 10mg  daily, we can change rx next time if need.  Reminder to schedule repeat Colonoscopy now >5 years. We can get copy of the new Colonoscopy report, have them send it to our office when completed.  DUE for FASTING BLOOD WORK (no food or drink after midnight before the lab appointment, only water or coffee without cream/sugar on the morning of)  SCHEDULE "Lab Only" visit in the morning at the clinic for lab draw in 3-6 WEEKS   - Make sure Lab Only appointment is at about 1 week before your next appointment, so that results will be available  For Lab Results, once available within 2-3 days of blood draw, you can can log in to MyChart online to view your results and a brief explanation. Also, we can discuss results at next follow-up visit.   Please schedule a Follow-up Appointment to: Return in about 4 weeks (around 03/27/2020) for Annual Physical.  If you have any other questions or concerns, please feel free to call the office or send a message through MyChart. You may also schedule an earlier appointment if necessary.  Additionally, you may be receiving a survey about your experience at our office within a few days to 1 week by e-mail or mail. We value your feedback.  , DO [facility], Saralyn Pilar

## 2020-02-28 NOTE — Assessment & Plan Note (Signed)
Had elevated BP due to stress, now improved back on medication Lisinopril recently (old expired med however) Well-controlled HTN - Home BP readings was elevated, now improving    Plan:  1. Continue current BP regimen - RE ORDER Lisinopril 5mg  daily, new rx sent 2. Encourage improved lifestyle - low sodium diet, regular exercise 3. Continue monitor BP outside office, bring readings to next visit, if persistently >140/90 or new symptoms notify office sooner  May adjust dose up to 10mg  (x 2 of the 5mg  tab) if inadequate control F/u at next visit

## 2020-02-28 NOTE — Progress Notes (Signed)
Subjective:    Patient ID: [name], female    DOB: 1961/10/16, 59 y.o.   MRN: [id number]  [name] is a 59 y.o. female presenting on 02/28/2020 for Hypertension   HPI   CHRONIC HTN: - Last visit with me 09/2019, for initial visit for same problem however at that time she did not have significant evidence of HTN active at that time she was off medication, see prior notes for background information. 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 - Today patient reports restarted Lisinopiril 5mg  daily from previous and has done much better in past few weeks. Home BP readings were elevated, sometimes worse in PM. Previous pill Lisinopril is old and expired but thinks it may be helping Current Meds - Lisinopril 5mg  daily   Reports good compliance, took meds today. Tolerating well, w/o complaints. Lifestyle: - Diet: balanced, unchanged. Low sodium. No caffeine - Exercise: Limited due to time. Denies CP, dyspnea, HA, edema, dizziness / lightheadedness  Major Depression, chronic recurrent - currently in remission Prior history on chart possible Bipolar, mood disorder Last visit with Dr Cephus Shelling 10/2019  She is doing well, without any complaints w mood Off medication. Previously on venlafaxine.   Additional complaints / updates  Knee Joint pain / Lumbar OA/DJD Spinal stenosis Future apt w [facility] Dr Estanislado Pandy - has upcoming apt 03/2020  Future apt return to [facility] Dr Theda Sers for knee, will proceed with PT   Health Maintenance:  Colonoscopy 2015 [place], do not see record however, reported no polyps good for 5 year, they sent her reminder for repeat colonoscopy now she will work on this soon.  Pap smear, last done 2019 from [facility] in [place], request record. No abnormal    Depression screen [facility] 2/9 02/28/2020 10/13/2019  Decreased  Interest 0 0  Down, Depressed, Hopeless 0 0  PHQ - 2 Score 0 0  Altered sleeping 0 0  Tired, decreased energy 0 0  Change in appetite 0 0  Feeling bad or failure about yourself  0 0  Trouble concentrating 0 0  Moving slowly or fidgety/restless 0 0  Suicidal thoughts 0 0  PHQ-9 Score 0 0  Difficult doing work/chores Not difficult at all Not difficult at all    Social History   Tobacco Use  . Smoking status: Never Smoker  . Smokeless tobacco: Never Used  Substance Use Topics  . Alcohol use: No    Comment: social   . Drug use: No    Review of Systems Per HPI unless specifically indicated above     Objective:    BP 111/60   Pulse 68   Temp 97.8 F (36.6 C) (Temporal)   Ht 5\' 3"  (1.6 m)   Wt 160 lb (72.6 kg)   LMP 05/28/2013   SpO2 97%   BMI 28.34 kg/m   Wt Readings from Last 3 Encounters:  02/28/20 160 lb (72.6 kg)  10/13/19 160 lb (72.6 kg)  02/07/19 171 lb 1 oz (77.6 kg)    Physical Exam Vitals and nursing note reviewed.  Constitutional:      General: She is not in acute distress.    Appearance: She is well-developed. She is not diaphoretic.     Comments: Well-appearing, comfortable, cooperative  HENT:     Head: Normocephalic and atraumatic.  Eyes:     General:  Right eye: No discharge.        Left eye: No discharge.     Conjunctiva/sclera: Conjunctivae normal.  Cardiovascular:     Rate and Rhythm: Normal rate and regular rhythm.     Pulses: Normal pulses.     Heart sounds: Normal heart sounds. No murmur.  Pulmonary:     Effort: Pulmonary effort is normal.  Skin:    General: Skin is warm and dry.     Findings: No erythema or rash.  Neurological:     Mental Status: She is alert and oriented to person, place, and time.  Psychiatric:        Behavior: Behavior normal.     Comments: Well groomed, good eye contact, normal speech and thoughts       Results for orders placed or performed in visit on 12/26/19  HM PAP SMEAR  Result Value Ref  Range   HM Pap smear      Negative for intraepithelial lesion or malignancy, No high risk HPV detected      Assessment & Plan:   Problem List Items Addressed This Visit    Major depression, recurrent, full remission (HCC)    Currently depression is in remission, seems chronic recurrent problem previous Last visit Dr Maryruth Bun Psychiatry 10/2019 Continues to remain off venlafaxine      Essential hypertension - Primary    Had elevated BP due to stress, now improved back on medication Lisinopril recently (old expired med however) Well-controlled HTN - Home BP readings was elevated, now improving    Plan:  1. Continue current BP regimen - RE ORDER Lisinopril 5mg  daily, new rx sent 2. Encourage improved lifestyle - low sodium diet, regular exercise 3. Continue monitor BP outside office, bring readings to next visit, if persistently >140/90 or new symptoms notify office sooner  May adjust dose up to 10mg  (x 2 of the 5mg  tab) if inadequate control F/u at next visit      Relevant Medications   lisinopril (ZESTRIL) 5 MG tablet      Meds ordered this encounter  Medications  . lisinopril (ZESTRIL) 5 MG tablet    Sig: Take 1 tablet (5 mg total) by mouth daily.    Dispense:  90 tablet    Refill:  3      Follow up plan: Return in about 4 weeks (around 03/27/2020) for Annual Physical.  Future labs already in system, ready to be released.   , DO [facility] 02/28/2020, 1:32 PM

## 2020-02-28 NOTE — Assessment & Plan Note (Signed)
Currently depression is in remission, seems chronic recurrent problem previous Last visit Dr Kapur Psychiatry 10/2019 Continues to remain off venlafaxine 

## 2020-03-12 ENCOUNTER — Telehealth: Payer: Self-pay | Admitting: Family Medicine

## 2020-03-12 NOTE — Telephone Encounter (Signed)
Copied from CRM [id number]. Topic: General - Other >> Mar 12, 2020  8:55 AM Tamela Oddi wrote: Reason for CRM: Patient would like to know if the doctor can prescribe some anxiety medication.  CB# [phone]

## 2020-03-12 NOTE — Telephone Encounter (Signed)
Left message for patient to make a face to face appointment to get anxiety meds prescribed.

## 2020-03-20 ENCOUNTER — Other Ambulatory Visit: Payer: Self-pay

## 2020-03-20 ENCOUNTER — Ambulatory Visit (INDEPENDENT_AMBULATORY_CARE_PROVIDER_SITE_OTHER): Payer: BC Managed Care – PPO | Admitting: Family Medicine

## 2020-03-20 ENCOUNTER — Encounter: Payer: Self-pay | Admitting: Family Medicine

## 2020-03-20 DIAGNOSIS — F5104 Psychophysiologic insomnia: Secondary | ICD-10-CM | POA: Insufficient documentation

## 2020-03-20 DIAGNOSIS — F419 Anxiety disorder, unspecified: Secondary | ICD-10-CM

## 2020-03-20 DIAGNOSIS — F3342 Major depressive disorder, recurrent, in full remission: Secondary | ICD-10-CM

## 2020-03-20 NOTE — Assessment & Plan Note (Signed)
Currently depression is in remission, seems chronic recurrent problem previous Last visit Dr Maryruth Bun Psychiatry 10/2019 Continues to remain off venlafaxine

## 2020-03-20 NOTE — Progress Notes (Signed)
Virtual Visit via Telephone The purpose of this virtual visit is to provide medical care while limiting exposure to the novel coronavirus (COVID19) for both patient and office staff.  Consent was obtained for phone visit:  Yes.   Answered questions that patient had about telehealth interaction:  Yes.   I discussed the limitations, risks, security and privacy concerns of performing an evaluation and management service by telephone. I also discussed with the patient that there may be a patient responsible charge related to this service. The patient expressed understanding and agreed to proceed.  Patient Location: Home Provider Location: [facility])  ---------------------------------------------------------------------- Chief Complaint  Patient presents with  . Anxiety    onset 2 weeks can't sleep    S: Reviewed CMA documentation. I have called patient and gathered additional HPI as follows:  Insomnia / Anxiety / Stress Major Depression, chronic recurrent - currently in remission Prior history on chart possible Bipolar, mood disorder Last visit with Dr Cephus Shelling 10/2019 She is doing well, without any complaints w mood. Remains off medication. Previously on venlafaxine. No further changes from Dr Nicolasa Ducking since that time.  Now today reports past 2 weeks difficult with inc stress and anxiety. Increasing stress, caring for mother, work and other anxiety factors - Not doing any therapy sessions or counseling. - She used to take Klonopin / Xanax in years past. Not on them for several years. - She is not on Melatonin or other supplement  Denies depression, racing thoughts mood swing, agitation, pain breathing problem or other factor for sleep   Past Medical History:  Diagnosis Date  . Anxiety   . Hyperlipidemia    Social History   Tobacco Use  . Smoking status: Never Smoker  . Smokeless tobacco: Never Used  Substance Use Topics  . Alcohol use: No   Comment: social   . Drug use: No    Current Outpatient Medications:  .  Biotin 1000 MCG tablet, Take by mouth., Disp: , Rfl:  .  calcium-vitamin D (OSCAL WITH D) 500-200 MG-UNIT per tablet, Take 1 tablet by mouth daily. For Vitamin D replacement and mood control., Disp: 30 tablet, Rfl: 0 .  lisinopril (ZESTRIL) 5 MG tablet, Take 1 tablet (5 mg total) by mouth daily., Disp: 90 tablet, Rfl: 3 .  Multiple Vitamin (MULTIVITAMIN WITH MINERALS) TABS, Take 1 tablet by mouth daily. For nutritional supplementation., Disp: 30 tablet, Rfl: 0  Depression screen [facility] 2/9 03/20/2020 02/28/2020 10/13/2019  Decreased Interest 0 0 0  Down, Depressed, Hopeless 0 0 0  PHQ - 2 Score 0 0 0  Altered sleeping 1 0 0  Tired, decreased energy 0 0 0  Change in appetite 0 0 0  Feeling bad or failure about yourself  0 0 0  Trouble concentrating 0 0 0  Moving slowly or fidgety/restless 0 0 0  Suicidal thoughts 0 0 0  PHQ-9 Score 1 0 0  Difficult doing work/chores Not difficult at all Not difficult at all Not difficult at all    GAD 7 : Generalized Anxiety Score 03/20/2020 02/28/2020 10/13/2019  Nervous, Anxious, on Edge 2 0 0  Control/stop worrying 0 0 0  Worry too much - different things 1 0 0  Trouble relaxing 1 0 0  Restless 0 0 0  Easily annoyed or irritable 0 2 0  Afraid - awful might happen 0 0 0  Total GAD 7 Score 4 2 0  Anxiety Difficulty Somewhat difficult Not difficult at all Not  difficult at all    -------------------------------------------------------------------------- O: No physical exam performed due to remote telephone encounter.  Lab results reviewed.  No results found for this or any previous visit (from the past 2160 hour(s)).  -------------------------------------------------------------------------- A&P:  Problem List Items Addressed This Visit    Psychophysiological insomnia - Primary   Major depression, recurrent, full remission (HCC)    Currently depression is in remission,  seems chronic recurrent problem previous Last visit Dr Maryruth Bun Psychiatry 10/2019 Continues to remain off venlafaxine      Anxiety     #Anxiety/Insomnia Seems recent onset acute past 2+ weeks affecting her more now, inc insomnia, unable to rest and sleep properly, seems related to anxiety/stress keeping her awake. - Does not seem mood or depression related  Checked PDMP. Patient not on BDZ Klonopin or Xanax for long time.  I advised her that these medications are not optimal acute insomnia therapy, and pose some risks long term with dependence and other concerns.  I recommended start with melatonin supplement 1-5mg  nightly titrate dose up to max 10 if need Improve sleep hygiene reviewed, per AVS  Next round of medication options could include resuming an SSRI such as Lexapro or the Venlafaxine, can do low dose, for period of some few weeks or months. Or consider Trazodone vs Remeron as an option. Possibly Ambien as hypnotic agent PRN. Or even buspar for anxiety. Also Hydroxyzine as needed for insomnia PRN.  I advised her to check back in with Dr Maryruth Bun for follow-up on insomnia/anxiety as this seems to be psychologically driven primarily and she would most likely be able to help, and I would be willing to rx or discuss further once she discuss with Psychiatry and gets their recommendations.  No orders of the defined types were placed in this encounter.   Follow-up: - Return in 4-6 weeks anxiety insomnia if not improving.  Patient verbalizes understanding with the above medical recommendations including the limitation of remote medical advice.  Specific follow-up and call-back criteria were given for patient to follow-up or seek medical care more urgently if needed.   - Time spent in direct consultation with patient on phone: 9 minutes   Saralyn Pilar, DO [facility] 03/20/2020, 10:31 AM

## 2020-03-20 NOTE — Patient Instructions (Addendum)
Try melatonin increase from 1mg  up to 5mg  30 min before bed gradually.  There are many other med options that I can offer in future if this doesn't work.  Hydroxyzine Trazodone Mirtazapine Buspar Venlafaxine/Effexor, Lexapro Ambien / Zolpidem  But I would ask that you review this concern first with Psychiatry. Please speak with Dr next if you are interested in other stronger rx for anxiety / insomnia  Sleep Hygiene Recommendations to promote healthy sleep in all patients, especially if symptoms of insomnia are worsening. Due to the nature of sleep rhythms, if your body gets "out of rhythm", it may take some time before your sleep cycle can be "reset".  Please try to follow as many of the following tips as you can, usually there are only a few of these are the primary cause of the problem.  ?To reset your sleep rhythm, go to bed and get up at the same time every day ?Sleep only long enough to feel rested and then get out of bed ?Do not try to force yourself to sleep. If you can't sleep, get out of bed and try again later. ?Avoid naps during the day, unless excessively tired. The more sleeping during the day, then the less sleep your body needs at night.  ?Have coffee, tea, and other foods that have caffeine only in the morning ?Exercise several days a week, but not right before bed ?If you drink alcohol, prefer to have appropriate drink with one meal, but prefer to avoid alcohol in the evening, and bedtime ?If you smoke, avoid smoking, especially in the evening  ?Avoid watching TV or looking at phones, computers, or reading devices ("e-books") that give off light at least 30 minutes before bed. This artificial light sends "awake signals" to your brain and can make it harder to fall asleep. ?Make your bedroom a comfortable place where it is easy to fall asleep: ? Put up shades or special blackout curtains to block light from outside. ? Use a white noise machine to block  noise. ? Keep the temperature cool. ?Try your best to solve or at least address your problems before you go to bed ?Use relaxation techniques to manage stress. Ask your health care provider to suggest some techniques that may work well for you. These may include: ? Breathing exercises. ? Routines to release muscle tension. ? Visualizing peaceful scenes.   Please schedule a Follow-up Appointment to: Return in about 4 weeks (around 04/17/2020), or if symptoms worsen or fail to improve, for insomnia, anxiety if not improved.  If you have any other questions or concerns, please feel free to call the office or send a message through MyChart. You may also schedule an earlier appointment if necessary.  Additionally, you may be receiving a survey about your experience at our office within a few days to 1 week by e-mail or mail. We value your feedback.  Maryruth Bun, DO [facility], Saralyn Pilar

## 2020-03-25 NOTE — Progress Notes (Deleted)
Office Visit Note  Patient: [name]             Date of Birth: [birth year]           MRN: [id number]             PCP: Smitty Cords, DO Referring: Freddy Finner, MD Visit Date: 04/02/2020 Occupation: @GUAROCC @  Subjective:  No chief complaint on file.   History of Present Illness: [name] is a 59 y.o. female ***   Activities of Daily Living:  Patient reports morning stiffness for *** {minute/hour:19697}.   Patient {ACTIONS;DENIES/REPORTS:21021675::"Denies"} nocturnal pain.  Difficulty dressing/grooming: {ACTIONS;DENIES/REPORTS:21021675::"Denies"} Difficulty climbing stairs: {ACTIONS;DENIES/REPORTS:21021675::"Denies"} Difficulty getting out of chair: {ACTIONS;DENIES/REPORTS:21021675::"Denies"} Difficulty using hands for taps, buttons, cutlery, and/or writing: {ACTIONS;DENIES/REPORTS:21021675::"Denies"}  No Rheumatology ROS completed.   PMFS History:  Patient Active Problem List   Diagnosis Date Noted  . Psychophysiological insomnia 03/20/2020  . Anxiety 03/20/2020  . Major depression, recurrent, full remission (HCC) 10/13/2019  . Overweight (BMI 25.0-29.9) 10/13/2019  . Essential hypertension 10/26/2017  . Fatigue 09/27/2017  . 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 Hyperlipidemia 01/19/2017  . Generalized anxiety disorder 08/19/2012    Class: Chronic    Past Medical History:  Diagnosis Date  . Anxiety   . Hyperlipidemia     Family History  Problem Relation Age of Onset  . Arthritis Mother   . Hypertension Mother   . Hyperlipidemia Mother   . Hypothyroidism Mother   . Heart attack Father 70  . Sarcoidosis Father        died of sarcoidosis   . Bipolar disorder Brother   . Anxiety disorder Brother   . Depression Brother   . Depression Maternal Aunt    Past Surgical History:  Procedure Laterality Date  . BUNIONECTOMY Left   . WISDOM TOOTH EXTRACTION     Social History   Social History Narrative   Single.   2 children.    Works as 41 at [employer].   Enjoys going to the movies, traveling.   Immunization History  Administered Date(s) Administered  . Influenza-Unspecified 09/11/2016  . Td 08/12/2017     Objective: Vital Signs: LMP 05/28/2013    Physical Exam   Musculoskeletal Exam: ***  CDAI Exam: CDAI Score: -- Patient Global: --; Provider Global: -- Swollen: --; Tender: -- Joint Exam 04/02/2020   No joint exam has been documented for this visit   There is currently no information documented on the homunculus. Go to the Rheumatology activity and complete the homunculus joint exam.  Investigation: No additional findings.  Imaging: No results found.  Recent Labs: Lab Results  Component Value Date   WBC 6.5 03/30/2018   HGB 13.4 03/30/2018   PLT 283.0 03/30/2018   NA 139 03/30/2018   K 4.6 03/30/2018   CL 102 03/30/2018   CO2 30 03/30/2018   GLUCOSE 93 03/30/2018   BUN 19 03/30/2018   CREATININE 0.73 03/30/2018   BILITOT 0.5 03/30/2018   ALKPHOS 76 03/30/2018   AST 22 03/30/2018   ALT 18 03/30/2018   PROT 6.8 03/30/2018   ALBUMIN 4.1 03/30/2018   CALCIUM 9.0 03/30/2018   GFRAA >60 08/30/2012    Speciality Comments: No specialty comments available.  Procedures:  No procedures performed Allergies: Sulfa antibiotics   Assessment / Plan:     Visit Diagnoses: No diagnosis found.  Orders: No orders of the defined types were placed in this encounter.  No orders of the defined types were  placed in this encounter.   Face-to-face time spent with patient was *** minutes. Greater than 50% of time was spent in counseling and coordination of care.  Follow-Up Instructions: No follow-ups on file.   Ofilia Neas, PA-C  Note - This record has been created using Dragon software.  Chart creation errors have been sought, but may not always  have been located. Such creation errors do not reflect on  the standard of medical care.

## 2020-04-02 ENCOUNTER — Ambulatory Visit: Payer: BC Managed Care – PPO | Admitting: Rheumatology

## 2020-04-02 DIAGNOSIS — Z8619 Personal history of other infectious and parasitic diseases: Secondary | ICD-10-CM

## 2020-04-02 DIAGNOSIS — F5104 Psychophysiologic insomnia: Secondary | ICD-10-CM

## 2020-04-02 DIAGNOSIS — F411 Generalized anxiety disorder: Secondary | ICD-10-CM

## 2020-04-02 DIAGNOSIS — Z8639 Personal history of other endocrine, nutritional and metabolic disease: Secondary | ICD-10-CM

## 2020-04-02 DIAGNOSIS — M25569 Pain in unspecified knee: Secondary | ICD-10-CM

## 2020-04-02 DIAGNOSIS — I10 Essential (primary) hypertension: Secondary | ICD-10-CM

## 2020-04-02 DIAGNOSIS — R5383 Other fatigue: Secondary | ICD-10-CM

## 2020-04-02 DIAGNOSIS — M8589 Other specified disorders of bone density and structure, multiple sites: Secondary | ICD-10-CM

## 2020-04-30 ENCOUNTER — Ambulatory Visit: Payer: BC Managed Care – PPO | Admitting: Rheumatology

## 2020-05-06 ENCOUNTER — Encounter: Payer: Self-pay | Admitting: Family Medicine

## 2020-05-06 ENCOUNTER — Other Ambulatory Visit: Payer: Self-pay

## 2020-05-06 ENCOUNTER — Ambulatory Visit (INDEPENDENT_AMBULATORY_CARE_PROVIDER_SITE_OTHER): Payer: BC Managed Care – PPO | Admitting: Family Medicine

## 2020-05-06 VITALS — BP 137/73 | HR 64 | Temp 97.7°F | Ht 63.0 in | Wt 160.2 lb

## 2020-05-06 DIAGNOSIS — M545 Low back pain, unspecified: Secondary | ICD-10-CM

## 2020-05-06 DIAGNOSIS — R220 Localized swelling, mass and lump, head: Secondary | ICD-10-CM

## 2020-05-06 LAB — POCT URINALYSIS DIPSTICK
Bilirubin, UA: NEGATIVE
Blood, UA: NEGATIVE
Glucose, UA: NEGATIVE
Ketones, UA: NEGATIVE
Leukocytes, UA: NEGATIVE
Nitrite, UA: NEGATIVE
Protein, UA: NEGATIVE
Spec Grav, UA: <=1.005 — AB (ref 1.010–1.025)
Urobilinogen, UA: 0.2 E.U./dL
pH, UA: 7 (ref 5.0–8.0)

## 2020-05-06 MED ORDER — PREDNISONE 20 MG PO TABS: 40.0000 mg | ORAL_TABLET | Freq: Every day | ORAL | 0 refills | Status: AC

## 2020-05-06 MED ORDER — DEXAMETHASONE SODIUM PHOSPHATE 10 MG/ML IJ SOLN
10.0000 mg | Freq: Once | INTRAMUSCULAR | Status: AC
  Administered 2020-05-06: 10 mg via INTRAMUSCULAR

## 2020-05-06 NOTE — Assessment & Plan Note (Signed)
U/A negative.  Likely muscular.  Discussed may find relief of right sided low back pain with current decadron injection and prednisone x 5 days.  To follow up in clinic if no relief in symptoms for re-assessment after completing medications.

## 2020-05-06 NOTE — Assessment & Plan Note (Addendum)
Left sided facial swelling with discomfort.  Redness and blanchable with palpation.  Does not cross midline, possible early shingles, no lesions noted vs. Allergic reaction.  Will treat with steroid injection and steroid taper.  Discussed to contact our office if notices any vesicles/blisters emerge.  Discussed importance of proceeding to ER if any vesicles/blisters appear on nose/near or around eyes.  Plan: 1. Decadron 10mg  IM given in clinic 2. To begin prednisone taper tomorrow 3. Strict ER precautions given 4. Follow up as needed

## 2020-05-06 NOTE — Patient Instructions (Addendum)
As we discussed, likely early shingles vs allergic reaction.  Is currently isolated to left side of face/neck.  We gave you a steroid injection today in clinic.    Can take benadryl 25-50mg  every 4-6 hours as needed for swelling/discomfort.  Begin Prednisone 40mg  tomorrow and take daily for the next 5 days.  If you notice any blisters/vesicles that begin to come up that are not on your nose or around your eyes, let know and I will send in medications to help with shingles.  If you begin to have any swelling in your mouth or throat, difficulty swallowing or breathing to proceed to the emergency room immediately.  If you begin to notice any blisters/vesicles on your face near your nose or eyes to proceed to the emergency room or your ophthalmologist's office immediately.  We will plan to see you back as needed for this  You will receive a survey after today's visit either digitally by e-mail or paper by USPS mail. Your experiences and feedback matter to [place].  Please respond so we know how we are doing as we provide care for you.  Call us with any questions/concerns/needs.  It is my goal to be available to you for your health concerns.  Thanks for choosing me to be a partner in your healthcare needs!  [place], FNP-C Family Nurse Practitioner [facility] Phone: [phone]

## 2020-05-06 NOTE — Progress Notes (Signed)
Subjective:    Patient ID: [name], female    DOB: 1961-09-29, 59 y.o.   MRN: [id number]  [name] is a 59 y.o. female presenting on 05/06/2020 for Back Pain ( Right side mid back pain w/ intermittent shar p pain x 45 minutes ) and Facial Swelling (left side face swelling and redness. under the left eye and down the left side of the neck x 45 minute )   HPI  [name] presents to clinic for evaluation of right sided mid back pain that has been intermittent over the past 45 minutes along with left sided facial swelling and redness from the left eye down the left side of the neck x 45 minutes.  Denies any new foods, detergents, fragrances, lotions, or sprays.  Denies any difficulty breathing, swallowing, visual changes or burning pain.  Has not taken anything for her symptoms prior to arrival in clinic.  Depression screen [name] 2/9 03/20/2020 02/28/2020 10/13/2019  Decreased Interest 0 0 0  Down, Depressed, Hopeless 0 0 0  PHQ - 2 Score 0 0 0  Altered sleeping 1 0 0  Tired, decreased energy 0 0 0  Change in appetite 0 0 0  Feeling bad or failure about yourself  0 0 0  Trouble concentrating 0 0 0  Moving slowly or fidgety/restless 0 0 0  Suicidal thoughts 0 0 0  PHQ-9 Score 1 0 0  Difficult doing work/chores Not difficult at all Not difficult at all Not difficult at all    Social History   Tobacco Use  . Smoking status: Never Smoker  . Smokeless tobacco: Never Used  Vaping Use  . Vaping Use: Never used  Substance Use Topics  . Alcohol use: No    Comment: social   . Drug use: No    Review of Systems  Constitutional: Negative.   HENT: Negative.   Eyes: Negative.   Respiratory: Negative.   Cardiovascular: Negative.   Gastrointestinal: Negative.   Endocrine: Negative.   Genitourinary: Negative.   Musculoskeletal: Positive for back pain. Negative for arthralgias, gait problem, joint swelling, myalgias, neck pain and neck stiffness.  Skin: Positive for rash. Negative for  pallor and wound.  Allergic/Immunologic: Negative.   Neurological: Negative.   Hematological: Negative.   Psychiatric/Behavioral: Negative.    Per HPI unless specifically indicated above     Objective:    BP 137/73 (BP Location: Right Arm, Patient Position: Sitting, Cuff Size: Normal)   Pulse 64   Temp 97.7 F (36.5 C) (Temporal)   Ht 5\' 3"  (1.6 m)   Wt 160 lb 3.2 oz (72.7 kg)   LMP 05/28/2013   BMI 28.38 kg/m   Wt Readings from Last 3 Encounters:  05/06/20 160 lb 3.2 oz (72.7 kg)  02/28/20 160 lb (72.6 kg)  10/13/19 160 lb (72.6 kg)    Physical Exam Vitals reviewed.  Constitutional:      General: She is not in acute distress.    Appearance: Normal appearance. She is well-developed, well-groomed and overweight. She is not ill-appearing or toxic-appearing.  HENT:     Head: Normocephalic.     Nose: Nose normal. No congestion or rhinorrhea.     Mouth/Throat:     Mouth: Mucous membranes are moist.     Pharynx: Oropharynx is clear. No oropharyngeal exudate or posterior oropharyngeal erythema.  Eyes:     General: Lids are normal. Vision grossly intact.        Right eye: No discharge.  Left eye: No discharge.     Extraocular Movements: Extraocular movements intact.     Conjunctiva/sclera: Conjunctivae normal.     Pupils: Pupils are equal, round, and reactive to light.  Neck:     Thyroid: No thyroid mass, thyromegaly or thyroid tenderness.  Cardiovascular:     Rate and Rhythm: Normal rate and regular rhythm.     Pulses: Normal pulses.     Heart sounds: Normal heart sounds. No murmur heard.  No friction rub. No gallop.   Pulmonary:     Effort: Pulmonary effort is normal. No respiratory distress.     Breath sounds: Normal breath sounds.  Musculoskeletal:        General: No tenderness. Normal range of motion.     Cervical back: Normal range of motion and neck supple. No rigidity or tenderness.     Right lower leg: No edema.     Left lower leg: No edema.    Lymphadenopathy:     Cervical: No cervical adenopathy.  Skin:    General: Skin is warm and dry.     Capillary Refill: Capillary refill takes less than 2 seconds.     Findings: Erythema present.     Comments: Slightly raised skin with erythema on left side of face through left side of neck.  No vesicles noted on exam.    Neurological:     General: No focal deficit present.     Mental Status: She is alert and oriented to person, place, and time.     Cranial Nerves: No cranial nerve deficit.     Sensory: No sensory deficit.     Motor: No weakness.     Coordination: Coordination normal.     Gait: Gait normal.  Psychiatric:        Attention and Perception: Attention and perception normal.        Mood and Affect: Mood and affect normal.        Speech: Speech normal.        Behavior: Behavior normal. Behavior is cooperative.        Thought Content: Thought content normal.        Cognition and Memory: Cognition and memory normal.        Judgment: Judgment normal.    Results for orders placed or performed in visit on 05/06/20  POCT Urinalysis Dipstick  Result Value Ref Range   Color, UA Yellow    Clarity, UA clear    Glucose, UA Negative Negative   Bilirubin, UA negative    Ketones, UA negative    Spec Grav, UA <=1.005 (A) 1.010 - 1.025   Blood, UA negative    pH, UA 7.0 5.0 - 8.0   Protein, UA Negative Negative   Urobilinogen, UA 0.2 0.2 or 1.0 E.U./dL   Nitrite, UA negative    Leukocytes, UA Negative Negative   Appearance     Odor        Assessment & Plan:   Problem List Items Addressed This Visit      Other   Left facial swelling - Primary    Left sided facial swelling with discomfort.  Redness and blanchable with palpation.  Does not cross midline, possible early shingles, no lesions noted vs. Allergic reaction.  Will treat with steroid injection and steroid taper.  Discussed to contact our office if notices any vesicles/blisters emerge.  Discussed importance of  proceeding to ER if any vesicles/blisters appear on nose/near or around eyes.  Plan: 1. Decadron 10mg   IM given in clinic 2. To begin prednisone taper tomorrow 3. Strict ER precautions given 4. Follow up as needed      Relevant Medications   predniSONE (DELTASONE) 20 MG tablet   Low back pain    U/A negative.  Likely muscular.  Discussed may find relief of right sided low back pain with current decadron injection and prednisone x 5 days.  To follow up in clinic if no relief in symptoms for re-assessment after completing medications.      Relevant Medications   predniSONE (DELTASONE) 20 MG tablet   Other Relevant Orders   POCT Urinalysis Dipstick (Completed)      Meds ordered this encounter  Medications  . dexamethasone (DECADRON) injection 10 mg  . predniSONE (DELTASONE) 20 MG tablet    Sig: Take 2 tablets (40 mg total) by mouth daily with breakfast for 5 days.    Dispense:  10 tablet    Refill:  0      Follow up plan: Return if symptoms worsen or fail to improve.   Charlaine Dalton, FNP Family Nurse Practitioner [facility] 05/06/2020, 11:33 AM

## 2020-05-07 ENCOUNTER — Telehealth: Payer: Self-pay

## 2020-05-07 NOTE — Telephone Encounter (Signed)
Copied from CRM [id number]. Topic: General - Other >> May 06, 2020  4:46 PM Dalphine Handing A wrote: Patient called to inform Dr. Kirtland Bouchard that she is still experiencing back pain and would like to know what he would advise she do next. Please advise

## 2020-05-07 NOTE — Telephone Encounter (Signed)
Spoke to the patient now she is feeling much better will call if gets worst.

## 2020-10-16 ENCOUNTER — Telehealth: Payer: Self-pay

## 2020-10-16 DIAGNOSIS — R7309 Other abnormal glucose: Secondary | ICD-10-CM

## 2020-10-16 DIAGNOSIS — Z1159 Encounter for screening for other viral diseases: Secondary | ICD-10-CM

## 2020-10-16 DIAGNOSIS — E7800 Pure hypercholesterolemia, unspecified: Secondary | ICD-10-CM

## 2020-10-16 DIAGNOSIS — Z0000 Encounter for general adult medical examination without abnormal findings: Secondary | ICD-10-CM

## 2020-10-16 DIAGNOSIS — F411 Generalized anxiety disorder: Secondary | ICD-10-CM

## 2020-10-16 DIAGNOSIS — E663 Overweight: Secondary | ICD-10-CM

## 2020-10-16 DIAGNOSIS — I10 Essential (primary) hypertension: Secondary | ICD-10-CM

## 2020-10-16 NOTE — Telephone Encounter (Signed)
Ordered future labs, ready to be released, she will need to schedule fasting lab and then physical.  Saralyn Pilar, DO [facility] 10/16/2020, 4:58 PM

## 2020-10-16 NOTE — Telephone Encounter (Signed)
Unable to reach the patient but left message that she was due for physical in 03/2020 and if she is requesting blood work then she can schedule lab appt week early and then physical with Dr Kirtland Bouchard.

## 2020-10-16 NOTE — Telephone Encounter (Signed)
Copied from CRM [id number]. Topic: General - Other >> Oct 16, 2020  8:56 AM Jaquita Rector A wrote: Reason for CRM: Patient called in to schedule for blood work but no labs are ordered from Dr Kirtland Bouchard. Please advise and call patient to schedule an appointment Ph# [phone]

## 2020-10-21 NOTE — Telephone Encounter (Signed)
Appointment is scheduled for lab tomorrow around 8:45 am 10/22/2020 ane next week 10/30/2020 around 8:20 am with Dr Kirtland Bouchard for physical.

## 2020-10-22 ENCOUNTER — Other Ambulatory Visit: Payer: BC Managed Care – PPO

## 2020-10-24 ENCOUNTER — Other Ambulatory Visit: Payer: BC Managed Care – PPO

## 2020-10-24 ENCOUNTER — Other Ambulatory Visit: Payer: Self-pay

## 2020-10-25 LAB — TSH: TSH: 1.7 mIU/L (ref 0.40–4.50)

## 2020-10-25 LAB — COMPLETE METABOLIC PANEL WITH GFR
AG Ratio: 1.8 (calc) (ref 1.0–2.5)
ALT: 20 U/L (ref 6–29)
AST: 24 U/L (ref 10–35)
Albumin: 4.3 g/dL (ref 3.6–5.1)
Alkaline phosphatase (APISO): 84 U/L (ref 37–153)
BUN: 15 mg/dL (ref 7–25)
CO2: 20 mmol/L (ref 20–32)
Calcium: 8.7 mg/dL (ref 8.6–10.4)
Chloride: 105 mmol/L (ref 98–110)
Creat: 0.87 mg/dL (ref 0.50–1.05)
GFR, Est African American: 86 mL/min/{1.73_m2} (ref >=60)
GFR, Est Non African American: 74 mL/min/{1.73_m2} (ref >=60)
Globulin: 2.4 g/dL (calc) (ref 1.9–3.7)
Glucose, Bld: 84 mg/dL (ref 65–99)
Potassium: 4.2 mmol/L (ref 3.5–5.3)
Sodium: 141 mmol/L (ref 135–146)
Total Bilirubin: 0.5 mg/dL (ref 0.2–1.2)
Total Protein: 6.7 g/dL (ref 6.1–8.1)

## 2020-10-25 LAB — LIPID PANEL
Cholesterol: 223 mg/dL — ABNORMAL HIGH (ref <200)
HDL: 68 mg/dL (ref >=50)
LDL Cholesterol (Calc): 132 mg/dL (calc) — ABNORMAL HIGH
Non-HDL Cholesterol (Calc): 155 mg/dL (calc) — ABNORMAL HIGH (ref <130)
Total CHOL/HDL Ratio: 3.3 (calc) (ref <5.0)
Triglycerides: 115 mg/dL (ref <150)

## 2020-10-25 LAB — CBC WITH DIFFERENTIAL/PLATELET
Absolute Monocytes: 449 cells/uL (ref 200–950)
Basophils Absolute: 78 cells/uL (ref 0–200)
Basophils Relative: 1.2 %
Eosinophils Absolute: 78 cells/uL (ref 15–500)
Eosinophils Relative: 1.2 %
HCT: 40.2 % (ref 35.0–45.0)
Hemoglobin: 13.7 g/dL (ref 11.7–15.5)
Lymphs Abs: 1437 cells/uL (ref 850–3900)
MCH: 30.6 pg (ref 27.0–33.0)
MCHC: 34.1 g/dL (ref 32.0–36.0)
MCV: 89.7 fL (ref 80.0–100.0)
MPV: 9.2 fL (ref 7.5–12.5)
Monocytes Relative: 6.9 %
Neutro Abs: 4459 cells/uL (ref 1500–7800)
Neutrophils Relative %: 68.6 %
Platelets: 310 10*3/uL (ref 140–400)
RBC: 4.48 10*6/uL (ref 3.80–5.10)
RDW: 13.1 % (ref 11.0–15.0)
Total Lymphocyte: 22.1 %
WBC: 6.5 10*3/uL (ref 3.8–10.8)

## 2020-10-25 LAB — HEPATITIS C ANTIBODY
Hepatitis C Ab: NONREACTIVE
SIGNAL TO CUT-OFF: 0.01 (ref <1.00)

## 2020-10-25 LAB — HEMOGLOBIN A1C
Hgb A1c MFr Bld: 5.4 % of total Hgb (ref <5.7)
Mean Plasma Glucose: 108 (calc)
eAG (mmol/L): 6 (calc)

## 2020-10-30 ENCOUNTER — Encounter: Payer: BC Managed Care – PPO | Admitting: Family Medicine

## 2020-11-01 ENCOUNTER — Other Ambulatory Visit: Payer: Self-pay | Admitting: Nurse Practitioner

## 2020-11-01 DIAGNOSIS — G8929 Other chronic pain: Secondary | ICD-10-CM

## 2020-11-07 ENCOUNTER — Encounter: Payer: BC Managed Care – PPO | Admitting: Family Medicine

## 2020-12-06 ENCOUNTER — Encounter: Payer: Self-pay | Admitting: Family Medicine

## 2020-12-06 ENCOUNTER — Telehealth (INDEPENDENT_AMBULATORY_CARE_PROVIDER_SITE_OTHER): Payer: BC Managed Care – PPO | Admitting: Family Medicine

## 2020-12-06 VITALS — Ht 62.0 in | Wt 165.0 lb

## 2020-12-06 DIAGNOSIS — J0110 Acute frontal sinusitis, unspecified: Secondary | ICD-10-CM

## 2020-12-06 MED ORDER — FLUTICASONE PROPIONATE 50 MCG/ACT NA SUSP: 2.0000 | Freq: Every day | NASAL | 3 refills | Status: DC

## 2020-12-06 MED ORDER — AMOXICILLIN-POT CLAVULANATE 875-125 MG PO TABS: 1.0000 | ORAL_TABLET | Freq: Two times a day (BID) | ORAL | 0 refills | Status: DC

## 2020-12-06 NOTE — Progress Notes (Signed)
Virtual Visit via Telephone The purpose of this virtual visit is to provide medical care while limiting exposure to the novel coronavirus (COVID19) for both patient and office staff.  Consent was obtained for phone visit:  Yes.   Answered questions that patient had about telehealth interaction:  Yes.   I discussed the limitations, risks, security and privacy concerns of performing an evaluation and management service by telephone. I also discussed with the patient that there may be a patient responsible charge related to this service. The patient expressed understanding and agreed to proceed.  Patient Location: Home Provider Location: [facility] (Office)  Participants in virtual visit: - Patient: [name] - CMA: Burnell Blanks, CMA - Provider: Dr Althea Charon  ---------------------------------------------------------------------- Chief Complaint  Patient presents with  . Cough  . nasel drainage    S: Reviewed CMA documentation. I have called patient and gathered additional HPI as follows:  Sinusitis Reports that symptoms started 1 month ago with sinus cough and congestion drainage. - No sick contact or exposure. No prior recent COVID testing - Tried OTC Mucinex for 3 weeks, Coricidin.  Up to date on COVID Vaccine last dose (#2) Pfizer 07/10/20. Next due booster in February.  Patient is currently working  Denies any known or suspected exposure to person with or possibly with COVID19.  Denies any fevers, chills, sweats, body ache, cough, shortness of breath, headache, abdominal pain, diarrhea  Past Medical History:  Diagnosis Date  . Anxiety   . Hyperlipidemia    Social History   Tobacco Use  . Smoking status: Never Smoker  . Smokeless tobacco: Never Used  Vaping Use  . Vaping Use: Never used  Substance Use Topics  . Alcohol use: No    Comment: social   . Drug use: No    Current Outpatient Medications:  .  amoxicillin-clavulanate (AUGMENTIN)  875-125 MG tablet, Take 1 tablet by mouth 2 (two) times daily., Disp: 20 tablet, Rfl: 0 .  calcium-vitamin D (OSCAL WITH D) 500-200 MG-UNIT per tablet, Take 1 tablet by mouth daily. For Vitamin D replacement and mood control., Disp: 30 tablet, Rfl: 0 .  fluticasone (FLONASE) 50 MCG/ACT nasal spray, Place 2 sprays into both nostrils daily. Use for 4-6 weeks then stop and use seasonally or as needed., Disp: 16 g, Rfl: 3 .  lisinopril (ZESTRIL) 5 MG tablet, Take 1 tablet (5 mg total) by mouth daily., Disp: 90 tablet, Rfl: 3 .  Multiple Vitamin (MULTIVITAMIN WITH MINERALS) TABS, Take 1 tablet by mouth daily. For nutritional supplementation., Disp: 30 tablet, Rfl: 0 .  venlafaxine XR (EFFEXOR-XR) 75 MG 24 hr capsule, venlafaxine ER 75 mg capsule,extended release 24 hr  TAKE 1 CAPSULE (75 MG TOTAL) BY MOUTH DAILY WITH BREAKFAST., Disp: , Rfl:  .  Biotin 1000 MCG tablet, Take by mouth. (Patient not taking: No sig reported), Disp: , Rfl:  .  traZODone (DESYREL) 50 MG tablet, Take 100 mg by mouth at bedtime. (Patient not taking: Reported on 12/06/2020), Disp: , Rfl:   Depression screen [facility] 2/9 03/20/2020 02/28/2020 10/13/2019  Decreased Interest 0 0 0  Down, Depressed, Hopeless 0 0 0  PHQ - 2 Score 0 0 0  Altered sleeping 1 0 0  Tired, decreased energy 0 0 0  Change in appetite 0 0 0  Feeling bad or failure about yourself  0 0 0  Trouble concentrating 0 0 0  Moving slowly or fidgety/restless 0 0 0  Suicidal thoughts 0 0 0  PHQ-9 Score 1 0 0  Difficult doing work/chores Not difficult at all Not difficult at all Not difficult at all    GAD 7 : Generalized Anxiety Score 03/20/2020 02/28/2020 10/13/2019  Nervous, Anxious, on Edge 2 0 0  Control/stop worrying 0 0 0  Worry too much - different things 1 0 0  Trouble relaxing 1 0 0  Restless 0 0 0  Easily annoyed or irritable 0 2 0  Afraid - awful might happen 0 0 0  Total GAD 7 Score 4 2 0  Anxiety Difficulty Somewhat difficult Not difficult at all Not  difficult at all    -------------------------------------------------------------------------- O: No physical exam performed due to remote telephone encounter.  Lab results reviewed.  Recent Results (from the past 2160 hour(s))  Hepatitis C antibody     Status: None   Collection Time: 10/24/20  9:12 AM  Result Value Ref Range   Hepatitis C Ab NON-REACTIVE NON-REACTI   SIGNAL TO CUT-OFF 0.01 <1.00    Comment: . HCV antibody was non-reactive. There is no laboratory  evidence of HCV infection. . In most cases, no further action is required. However, if recent HCV exposure is suspected, a test for HCV RNA (test code 80165) is suggested. . For additional information please refer to http://education.[facility].com/faq/FAQ22v1 (This link is being provided for informational/ educational purposes only.) .   TSH     Status: None   Collection Time: 10/24/20  9:12 AM  Result Value Ref Range   TSH 1.70 0.40 - 4.50 mIU/L  Lipid panel     Status: Abnormal   Collection Time: 10/24/20  9:12 AM  Result Value Ref Range   Cholesterol 223 (H) <200 mg/dL   HDL 68 > OR = 50 mg/dL   Triglycerides 537 <482 mg/dL   LDL Cholesterol (Calc) 132 (H) mg/dL (calc)    Comment: Reference range: <100 . Desirable range <100 mg/dL for primary prevention;   <70 mg/dL for patients with CHD or diabetic patients  with > or = 2 CHD risk factors. Marland Kitchen LDL-C is now calculated using the Martin-Hopkins  calculation, which is a validated novel method providing  better accuracy than the Friedewald equation in the  estimation of LDL-C.  Horald Pollen et al. Lenox Ahr. 7078;675(44): 2061-2068  (http://education.[facility].com/faq/FAQ164)    Total CHOL/HDL Ratio 3.3 <5.0 (calc)   Non-HDL Cholesterol (Calc) 155 (H) <130 mg/dL (calc)    Comment: For patients with diabetes plus 1 major ASCVD risk  factor, treating to a non-HDL-C goal of <100 mg/dL  (LDL-C of <92 mg/dL) is considered a therapeutic  option.    COMPLETE METABOLIC PANEL WITH GFR     Status: None   Collection Time: 10/24/20  9:12 AM  Result Value Ref Range   Glucose, Bld 84 65 - 99 mg/dL    Comment: .            Fasting reference interval .    BUN 15 7 - 25 mg/dL   Creat 0.10 0.71 - 2.19 mg/dL    Comment: For patients >43 years of age, the reference limit for Creatinine is approximately 13% higher for people identified as African-American. .    GFR, Est Non African American 74 > OR = 60 mL/min/1.58m2   GFR, Est African American 86 > OR = 60 mL/min/1.51m2   BUN/Creatinine Ratio NOT APPLICABLE 6 - 22 (calc)   Sodium 141 135 - 146 mmol/L   Potassium 4.2 3.5 - 5.3 mmol/L   Chloride 105 98 -  110 mmol/L   CO2 20 20 - 32 mmol/L   Calcium 8.7 8.6 - 10.4 mg/dL   Total Protein 6.7 6.1 - 8.1 g/dL   Albumin 4.3 3.6 - 5.1 g/dL   Globulin 2.4 1.9 - 3.7 g/dL (calc)   AG Ratio 1.8 1.0 - 2.5 (calc)   Total Bilirubin 0.5 0.2 - 1.2 mg/dL   Alkaline phosphatase (APISO) 84 37 - 153 U/L   AST 24 10 - 35 U/L   ALT 20 6 - 29 U/L  CBC with Differential/Platelet     Status: None   Collection Time: 10/24/20  9:12 AM  Result Value Ref Range   WBC 6.5 3.8 - 10.8 Thousand/uL   RBC 4.48 3.80 - 5.10 Million/uL   Hemoglobin 13.7 11.7 - 15.5 g/dL   HCT 26.2 03.5 - 59.7 %   MCV 89.7 80.0 - 100.0 fL   MCH 30.6 27.0 - 33.0 pg   MCHC 34.1 32.0 - 36.0 g/dL   RDW 41.6 38.4 - 53.6 %   Platelets 310 140 - 400 Thousand/uL   MPV 9.2 7.5 - 12.5 fL   Neutro Abs 4,459 1,500 - 7,800 cells/uL   Lymphs Abs 1,437 850 - 3,900 cells/uL   Absolute Monocytes 449 200 - 950 cells/uL   Eosinophils Absolute 78 15 - 500 cells/uL   Basophils Absolute 78 0 - 200 cells/uL   Neutrophils Relative % 68.6 %   Total Lymphocyte 22.1 %   Monocytes Relative 6.9 %   Eosinophils Relative 1.2 %   Basophils Relative 1.2 %  Hemoglobin A1c     Status: None   Collection Time: 10/24/20  9:12 AM  Result Value Ref Range   Hgb A1c MFr Bld 5.4 <5.7 % of total Hgb    Comment: For  the purpose of screening for the presence of diabetes: . <5.7%       Consistent with the absence of diabetes 5.7-6.4%    Consistent with increased risk for diabetes             (prediabetes) > or =6.5%  Consistent with diabetes . This assay result is consistent with a decreased risk of diabetes. . Currently, no consensus exists regarding use of hemoglobin A1c for diagnosis of diabetes in children. . According to American Diabetes Association (ADA) guidelines, hemoglobin A1c <7.0% represents optimal control in non-pregnant diabetic patients. Different metrics may apply to specific patient populations.  Standards of Medical Care in Diabetes(ADA). .    Mean Plasma Glucose 108 (calc)   eAG (mmol/L) 6.0 (calc)    -------------------------------------------------------------------------- A&P:  Problem List Items Addressed This Visit   None   Visit Diagnoses    Acute non-recurrent frontal sinusitis    -  Primary   Relevant Medications   amoxicillin-clavulanate (AUGMENTIN) 875-125 MG tablet   fluticasone (FLONASE) 50 MCG/ACT nasal spray     Consistent with acute frontal rhinosinusitis, likely initially viral URI vs allergic rhinitis component with worsening concern for bacterial infection now duration 1 month and second sickening  Vaccinated for COVID, waiting on booster, not yet 6 months. No sick contacts Less likely COVID symptoms Cannot rule out - offer testing, declined today  Plan: 1 Start Augmentin 875-125mg  PO BID x 10 days 2. Start Flonase 2 sprays in each nostril daily for next 4-6 weeks, then may stop and use seasonally or as needed  Return criteria reviewed   Meds ordered this encounter  Medications  . amoxicillin-clavulanate (AUGMENTIN) 875-125 MG tablet    Sig: Take  1 tablet by mouth 2 (two) times daily.    Dispense:  20 tablet    Refill:  0  . fluticasone (FLONASE) 50 MCG/ACT nasal spray    Sig: Place 2 sprays into both nostrils daily. Use for 4-6  weeks then stop and use seasonally or as needed.    Dispense:  16 g    Refill:  3    Follow-up: - Return in 1-2 weeks if not improved  Patient verbalizes understanding with the above medical recommendations including the limitation of remote medical advice.  Specific follow-up and call-back criteria were given for patient to follow-up or seek medical care more urgently if needed.   - Time spent in direct consultation with patient on phone: 9 minutes   Saralyn PilarAlexander Dianca Owensby, DO [facility] 12/06/2020, 10:59 AM

## 2021-02-25 ENCOUNTER — Other Ambulatory Visit: Payer: Self-pay | Admitting: Family Medicine

## 2021-02-25 DIAGNOSIS — I10 Essential (primary) hypertension: Secondary | ICD-10-CM

## 2021-03-06 ENCOUNTER — Other Ambulatory Visit: Payer: Self-pay | Admitting: Family Medicine

## 2021-03-06 DIAGNOSIS — J0110 Acute frontal sinusitis, unspecified: Secondary | ICD-10-CM

## 2021-08-01 ENCOUNTER — Other Ambulatory Visit: Payer: Self-pay

## 2021-08-01 ENCOUNTER — Ambulatory Visit: Payer: BC Managed Care – PPO | Admitting: Family Medicine

## 2021-08-01 ENCOUNTER — Encounter: Payer: Self-pay | Admitting: Family Medicine

## 2021-08-01 VITALS — BP 116/73 | HR 64 | Ht 62.0 in | Wt 167.8 lb

## 2021-08-01 DIAGNOSIS — M26609 Unspecified temporomandibular joint disorder, unspecified side: Secondary | ICD-10-CM | POA: Diagnosis not present

## 2021-08-01 DIAGNOSIS — M26622 Arthralgia of left temporomandibular joint: Secondary | ICD-10-CM

## 2021-08-01 MED ORDER — MELOXICAM 15 MG PO TABS: 15.0000 mg | ORAL_TABLET | Freq: Every day | ORAL | 2 refills | Status: DC | PRN

## 2021-08-01 NOTE — Progress Notes (Signed)
Subjective:    Patient ID: [name], female    DOB: Oct 27, 1961, 60 y.o.   MRN: [id number]  [name] is a 60 y.o. female presenting on 08/01/2021 for Jaw Pain   HPI  Left TMJ / Jaw Pain Reports onset 2-3 months ago, without known inciting injury. Has had similar flare before that was less severe that resolved. She was concerned about TMJ. She has some grinding noise with it and causes pain. Has limited how wide she can open mouth at times due to pain. She does chew on the R side of her mouth actually, she has some missing bottom teeth on left. - Taking Aleve OTC 1 pill 1-2 times a day - some relief when she takes it, reduces pain. - She used to chew gum a lot. She has tried softer foods now to avoid chewing. 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 Takes BC arthritis PRN   Depression screen [facility] 2/9 08/01/2021 03/20/2020 02/28/2020  Decreased Interest 0 0 0  Down, Depressed, Hopeless 0 0 0  PHQ - 2 Score 0 0 0  Altered sleeping 0 1 0  Tired, decreased energy 0 0 0  Change in appetite 0 0 0  Feeling bad or failure about yourself  0 0 0  Trouble concentrating 0 0 0  Moving slowly or fidgety/restless 0 0 0  Suicidal thoughts 0 0 0  PHQ-9 Score 0 1 0  Difficult doing work/chores Not difficult at all Not difficult at all Not difficult at all    Social History   Tobacco Use   Smoking status: Never   Smokeless tobacco: Never  Vaping Use   Vaping Use: Never used  Substance Use Topics   Alcohol use: No    Comment: social    Drug use: No    Review of Systems Per HPI unless specifically indicated above     Objective:    BP 116/73   Pulse 64   Ht 5\' 2"  (1.575 m)   Wt 167 lb 12.8 oz (76.1 kg)   LMP 05/28/2013   SpO2 96%   BMI 30.69 kg/m   Wt Readings from Last 3 Encounters:  08/01/21 167 lb 12.8 oz (76.1 kg)  12/06/20 165 lb (74.8 kg)  05/06/20 160 lb 3.2 oz (72.7 kg)    Physical Exam Vitals and nursing note reviewed.  Constitutional:       General: She is not in acute distress.    Appearance: Normal appearance. She is well-developed. She is not diaphoretic.     Comments: Well-appearing, comfortable, cooperative  HENT:     Head: Normocephalic and atraumatic.     Comments: Left jaw angle, no click or pop, mild tender, some reduced opening. Eyes:     General:        Right eye: No discharge.        Left eye: No discharge.     Conjunctiva/sclera: Conjunctivae normal.  Cardiovascular:     Rate and Rhythm: Normal rate.  Pulmonary:     Effort: Pulmonary effort is normal.  Skin:    General: Skin is warm and dry.     Findings: No erythema or rash.  Neurological:     Mental Status: She is alert and oriented to person, place, and time.  Psychiatric:        Mood and Affect: Mood normal.        Behavior: Behavior normal.        Thought Content:  Thought content normal.     Comments: Well groomed, good eye contact, normal speech and thoughts    Results for orders placed or performed in visit on 10/22/20  Hepatitis C antibody  Result Value Ref Range   Hepatitis C Ab NON-REACTIVE NON-REACTI   SIGNAL TO CUT-OFF 0.01 <1.00  TSH  Result Value Ref Range   TSH 1.70 0.40 - 4.50 mIU/L  Lipid panel  Result Value Ref Range   Cholesterol 223 (H) <200 mg/dL   HDL 68 > OR = 50 mg/dL   Triglycerides 175 <102 mg/dL   LDL Cholesterol (Calc) 132 (H) mg/dL (calc)   Total CHOL/HDL Ratio 3.3 <5.0 (calc)   Non-HDL Cholesterol (Calc) 155 (H) <130 mg/dL (calc)  COMPLETE METABOLIC PANEL WITH GFR  Result Value Ref Range   Glucose, Bld 84 65 - 99 mg/dL   BUN 15 7 - 25 mg/dL   Creat 5.85 2.77 - 8.24 mg/dL   GFR, Est Non African American 74 > OR = 60 mL/min/1.23m2   GFR, Est African American 86 > OR = 60 mL/min/1.46m2   BUN/Creatinine Ratio NOT APPLICABLE 6 - 22 (calc)   Sodium 141 135 - 146 mmol/L   Potassium 4.2 3.5 - 5.3 mmol/L   Chloride 105 98 - 110 mmol/L   CO2 20 20 - 32 mmol/L   Calcium 8.7 8.6 - 10.4 mg/dL   Total Protein 6.7  6.1 - 8.1 g/dL   Albumin 4.3 3.6 - 5.1 g/dL   Globulin 2.4 1.9 - 3.7 g/dL (calc)   AG Ratio 1.8 1.0 - 2.5 (calc)   Total Bilirubin 0.5 0.2 - 1.2 mg/dL   Alkaline phosphatase (APISO) 84 37 - 153 U/L   AST 24 10 - 35 U/L   ALT 20 6 - 29 U/L  CBC with Differential/Platelet  Result Value Ref Range   WBC 6.5 3.8 - 10.8 Thousand/uL   RBC 4.48 3.80 - 5.10 Million/uL   Hemoglobin 13.7 11.7 - 15.5 g/dL   HCT 23.5 36.1 - 44.3 %   MCV 89.7 80.0 - 100.0 fL   MCH 30.6 27.0 - 33.0 pg   MCHC 34.1 32.0 - 36.0 g/dL   RDW 15.4 00.8 - 67.6 %   Platelets 310 140 - 400 Thousand/uL   MPV 9.2 7.5 - 12.5 fL   Neutro Abs 4,459 1,500 - 7,800 cells/uL   Lymphs Abs 1,437 850 - 3,900 cells/uL   Absolute Monocytes 449 200 - 950 cells/uL   Eosinophils Absolute 78 15 - 500 cells/uL   Basophils Absolute 78 0 - 200 cells/uL   Neutrophils Relative % 68.6 %   Total Lymphocyte 22.1 %   Monocytes Relative 6.9 %   Eosinophils Relative 1.2 %   Basophils Relative 1.2 %  Hemoglobin A1c  Result Value Ref Range   Hgb A1c MFr Bld 5.4 <5.7 % of total Hgb   Mean Plasma Glucose 108 (calc)   eAG (mmol/L) 6.0 (calc)      Assessment & Plan:   Problem List Items Addressed This Visit   None Visit Diagnoses     TMJ (temporomandibular joint syndrome)    -  Primary   Relevant Medications   meloxicam (MOBIC) 15 MG tablet   TMJ tenderness, left           Clinically consistent with Left TMJ Syndrome with pain and stiffness Relatively new problem onset 2-3 months. No known inciting trigger or flare Followed by Dentist, has not had imaging X-ray Improved on OTC nsaids  and bc  Plan. Start rx NSAID Meloxicam 15mg  daily 1-2 weeks PRN avoid other nsaid Jaw rest, restrict certain foods, focus on soft or liquid food diet Recommend return to Dentist for further evaluation / imaging Future can refer to PT if indicated  Meds ordered this encounter  Medications   meloxicam (MOBIC) 15 MG tablet    Sig: Take 1 tablet (15 mg  total) by mouth daily as needed for pain.    Dispense:  30 tablet    Refill:  2      Follow up plan: Return in about 3 months (around 10/31/2021) for 3 month Annual Physical, AM apt fasting lab AFTER.   14/07/2021, DO [facility] 08/01/2021, 11:16 AM

## 2021-08-01 NOTE — Patient Instructions (Addendum)
Thank you for coming to the office today.  Take Meloxicam instead of aleve / bc. Try to take it maybe 1-2 weeks then pause then can restart - as needed.  Recommend Dentist next for jaw x-rays and evaluation, maybe some sort of dental oral appliance.  -------------------------------  Temporomandibular Joint Syndrome Temporomandibular joint syndrome (TMJ syndrome) is a condition that causes pain in the temporomandibular joints. These joints are located near your ears and allow your jaw to open and close. For people with TMJ syndrome, chewing, biting, or other movements of the jaw can be difficult or painful. TMJ syndrome is often mild and goes away within a few weeks. However, sometimes the condition becomes a long-term (chronic) problem. What are the causes? This condition may be caused by: Grinding your teeth or clenching your jaw. Some people do this when they are under stress. Arthritis. Injury to the jaw. Head or neck injury. Teeth or dentures that are not aligned well. In some cases, the cause of TMJ syndrome may not be known. What are the signs or symptoms? The most common symptom of this condition is an aching pain on the side of the head in the area of the TMJ. Other symptoms may include: Pain when moving your jaw, such as when chewing or biting. Being unable to open your jaw all the way. Making a clicking sound when you open your mouth. Headache. Earache. Neck or shoulder pain. How is this diagnosed? This condition may be diagnosed based on: Your symptoms and medical history. A physical exam. Your health care provider may check the range of motion of your jaw. Imaging tests, such as X-rays or an MRI. You may also need to see your dentist, who will determine if your teeth and jaw are lined up correctly. How is this treated? TMJ syndrome often goes away on its own. If treatment is needed, the options may include: Eating soft foods and applying ice or heat. Medicines to  relieve pain or inflammation. Medicines or massage to relax the muscles. A splint, bite plate, or mouthpiece to prevent teeth grinding or jaw clenching. Relaxation techniques or counseling to help reduce stress. A therapy for pain in which an electrical current is applied to the nerves through the skin (transcutaneous electrical nerve stimulation). Acupuncture. This is sometimes helpful to relieve pain. Jaw surgery. This is rarely needed. Follow these instructions at home: Eating and drinking Eat a soft diet if you are having trouble chewing. Avoid foods that require a lot of chewing. Do not chew gum. General instructions Take over-the-counter and prescription medicines only as told by your health care provider. If directed, put ice on the painful area. Put ice in a plastic bag. Place a towel between your skin and the bag. Leave the ice on for 20 minutes, 2-3 times a day. Apply a warm, wet cloth (warm compress) to the painful area as directed. Massage your jaw area and do any jaw stretching exercises as told by your health care provider. If you were given a splint, bite plate, or mouthpiece, wear it as told by your health care provider. Keep all follow-up visits as told by your health care provider. This is important. Contact a health care provider if: You are having trouble eating. You have new or worsening symptoms. Get help right away if: Your jaw locks open or closed. Summary Temporomandibular joint syndrome (TMJ syndrome) is a condition that causes pain in the temporomandibular joints. These joints are located near your ears and allow your jaw  to open and close. TMJ syndrome is often mild and goes away within a few weeks. However, sometimes the condition becomes a long-term (chronic) problem. Symptoms include an aching pain on the side of the head in the area of the TMJ, pain when chewing or biting, and being unable to open your jaw all the way. You may also make a clicking sound  when you open your mouth. TMJ syndrome often goes away on its own. If treatment is needed, it may include medicines to relieve pain, reduce inflammation, or relax the muscles. A splint, bite plate, or mouthpiece may also be used to prevent teeth grinding or jaw clenching. This information is not intended to replace advice given to you by your health care provider. Make sure you discuss any questions you have with your health care provider. Document Revised: 02/19/2021 Document Reviewed: 12/21/2017 Elsevier Patient Education  2022 Elsevier Inc.    Please schedule a Follow-up Appointment to: Return in about 3 months (around 10/31/2021) for 3 month Annual Physical, AM apt fasting lab AFTER.  If you have any other questions or concerns, please feel free to call the office or send a message through MyChart. You may also schedule an earlier appointment if necessary.  Additionally, you may be receiving a survey about your experience at our office within a few days to 1 week by e-mail or mail. We value your feedback.  Saralyn Pilar, DO [facility], [place]

## 2021-09-13 ENCOUNTER — Other Ambulatory Visit: Payer: Self-pay | Admitting: Family Medicine

## 2021-09-13 DIAGNOSIS — I10 Essential (primary) hypertension: Secondary | ICD-10-CM

## 2021-09-14 NOTE — Telephone Encounter (Signed)
Requested medication (s) are due for refill today: yes  Requested medication (s) are on the active medication list: yes  Last refill:  02/25/21 #90  Future visit scheduled: no  Notes to clinic:  called pt and LM on VM to call [facility] to make appt. Previous refill with note "schedule office visit prior to further refills." Practice phone number provided.   Requested Prescriptions  Pending Prescriptions Disp Refills   lisinopril (ZESTRIL) 5 MG tablet [Pharmacy Med Name: LISINOPRIL 5 MG TABLET] 90 tablet 0    Sig: TAKE 1 TABLET BY MOUTH EVERY DAY     Cardiovascular:  ACE Inhibitors Failed - 09/13/2021  3:15 PM      Failed - Cr in normal range and within 180 days    Creat  Date Value Ref Range Status  10/24/2020 0.87 0.50 - 1.05 mg/dL Final    Comment:    For patients >51 years of age, the reference limit for Creatinine is approximately 13% higher for people identified as African-American. .           Failed - K in normal range and within 180 days    Potassium  Date Value Ref Range Status  10/24/2020 4.2 3.5 - 5.3 mmol/L Final  08/30/2012 3.6 3.5 - 5.1 mmol/L Final          Passed - Patient is not pregnant      Passed - Last BP in normal range    BP Readings from Last 1 Encounters:  08/01/21 116/73          Passed - Valid encounter within last 6 months    Recent Outpatient Visits           1 month ago TMJ (temporomandibular joint syndrome)   [facility] Smitty Cords, DO   9 months ago Acute non-recurrent frontal sinusitis   [facility] [place], Netta Neat, DO   1 year ago Left facial swelling   [facility], Jodelle Gross, FNP   1 year ago Psychophysiological insomnia   [facility] [place], Netta Neat, DO   1 year ago Essential hypertension   [facility] [place], Netta Neat, DO

## 2021-09-24 ENCOUNTER — Other Ambulatory Visit: Payer: Self-pay | Admitting: Family Medicine

## 2021-09-24 DIAGNOSIS — I10 Essential (primary) hypertension: Secondary | ICD-10-CM

## 2021-09-24 NOTE — Telephone Encounter (Signed)
Requested medication (s) are due for refill today: Yes  Requested medication (s) are on the active medication list: Yes  Last refill:  02/25/21  Future visit scheduled: No  Notes to clinic:  Left pt. A message to call and make appointment.    Requested Prescriptions  Pending Prescriptions Disp Refills   lisinopril (ZESTRIL) 5 MG tablet [Pharmacy Med Name: LISINOPRIL 5 MG TABLET] 90 tablet 0    Sig: TAKE 1 TABLET BY MOUTH EVERY DAY     Cardiovascular:  ACE Inhibitors Failed - 09/24/2021  5:51 AM      Failed - Cr in normal range and within 180 days    Creat  Date Value Ref Range Status  10/24/2020 0.87 0.50 - 1.05 mg/dL Final    Comment:    For patients >44 years of age, the reference limit for Creatinine is approximately 13% higher for people identified as African-American. .           Failed - K in normal range and within 180 days    Potassium  Date Value Ref Range Status  10/24/2020 4.2 3.5 - 5.3 mmol/L Final  08/30/2012 3.6 3.5 - 5.1 mmol/L Final          Passed - Patient is not pregnant      Passed - Last BP in normal range    BP Readings from Last 1 Encounters:  08/01/21 116/73          Passed - Valid encounter within last 6 months    Recent Outpatient Visits           1 month ago TMJ (temporomandibular joint syndrome)   [facility] Smitty Cords, DO   9 months ago Acute non-recurrent frontal sinusitis   [facility] [place], Netta Neat, DO   1 year ago Left facial swelling   [facility], Jodelle Gross, FNP   1 year ago Psychophysiological insomnia   [facility] [place], Netta Neat, DO   1 year ago Essential hypertension   [facility] [place], Netta Neat, DO

## 2021-11-23 ENCOUNTER — Other Ambulatory Visit: Payer: Self-pay | Admitting: Family Medicine

## 2021-11-23 DIAGNOSIS — M26609 Unspecified temporomandibular joint disorder, unspecified side: Secondary | ICD-10-CM

## 2021-11-25 NOTE — Telephone Encounter (Signed)
Requested medications are due for refill today.  yes  Requested medications are on the active medications list.  yes  Last refill. 08/01/2021  Future visit scheduled.   no  Notes to clinic.  Labs are expired. Pt was to [facility] 10/31/2021. Dx code needed by pharmacy.    Requested Prescriptions  Pending Prescriptions Disp Refills   meloxicam (MOBIC) 15 MG tablet [Pharmacy Med Name: MELOXICAM 15 MG TABLET] 30 tablet 2    Sig: TAKE 1 TABLET BY MOUTH EVERY DAY AS NEEDED FOR PAIN     Analgesics:  COX2 Inhibitors Failed - 11/23/2021 10:52 AM      Failed - HGB in normal range and within 360 days    Hemoglobin  Date Value Ref Range Status  10/24/2020 13.7 11.7 - 15.5 g/dL Final   HGB  Date Value Ref Range Status  08/30/2012 14.2 12.0 - 16.0 g/dL Final          Failed - Cr in normal range and within 360 days    Creat  Date Value Ref Range Status  10/24/2020 0.87 0.50 - 1.05 mg/dL Final    Comment:    For patients >20 years of age, the reference limit for Creatinine is approximately 13% higher for people identified as African-American. Verna Czech - Patient is not pregnant      Passed - Valid encounter within last 12 months    Recent Outpatient Visits           3 months ago TMJ (temporomandibular joint syndrome)   [facility] Smitty Cords, DO   11 months ago Acute non-recurrent frontal sinusitis   [facility] Smitty Cords, DO   1 year ago Left facial swelling   [facility], Jodelle Gross, FNP   1 year ago Psychophysiological insomnia   [facility] Smitty Cords, DO   1 year ago Essential hypertension   [facility] [place], Netta Neat, DO

## 2021-12-18 ENCOUNTER — Other Ambulatory Visit: Payer: Self-pay | Admitting: Family Medicine

## 2021-12-18 DIAGNOSIS — I10 Essential (primary) hypertension: Secondary | ICD-10-CM

## 2021-12-18 NOTE — Telephone Encounter (Signed)
Requested medications are due for refill today.  yes  Requested medications are on the active medications list.  yes  Last refill. 09/24/2021  Future visit scheduled.   no  Notes to clinic.  Labs are expired.    Requested Prescriptions  Pending Prescriptions Disp Refills   lisinopril (ZESTRIL) 5 MG tablet [Pharmacy Med Name: LISINOPRIL 5 MG TABLET] 90 tablet 0    Sig: Take 1 tablet (5 mg total) by mouth daily. Need office visit for further refills     Cardiovascular:  ACE Inhibitors Failed - 12/18/2021  3:26 PM      Failed - Cr in normal range and within 180 days    Creat  Date Value Ref Range Status  10/24/2020 0.87 0.50 - 1.05 mg/dL Final    Comment:    For patients >55 years of age, the reference limit for Creatinine is approximately 13% higher for people identified as African-American. .           Failed - K in normal range and within 180 days    Potassium  Date Value Ref Range Status  10/24/2020 4.2 3.5 - 5.3 mmol/L Final  08/30/2012 3.6 3.5 - 5.1 mmol/L Final          Passed - Patient is not pregnant      Passed - Last BP in normal range    BP Readings from Last 1 Encounters:  08/01/21 116/73          Passed - Valid encounter within last 6 months    Recent Outpatient Visits           4 months ago TMJ (temporomandibular joint syndrome)   [facility] Smitty Cords, DO   1 year ago Acute non-recurrent frontal sinusitis   [facility] Smitty Cords, DO   1 year ago Left facial swelling   [facility], Jodelle Gross, FNP   1 year ago Psychophysiological insomnia   [facility] [place], Netta Neat, DO   1 year ago Essential hypertension   [facility] [place], Netta Neat, DO

## 2022-01-13 ENCOUNTER — Encounter: Payer: Self-pay | Admitting: Family Medicine

## 2022-01-13 ENCOUNTER — Other Ambulatory Visit: Payer: Self-pay

## 2022-01-13 ENCOUNTER — Ambulatory Visit (INDEPENDENT_AMBULATORY_CARE_PROVIDER_SITE_OTHER): Payer: BC Managed Care – PPO | Admitting: Family Medicine

## 2022-01-13 VITALS — BP 107/71 | HR 65 | Ht 62.0 in | Wt 163.4 lb

## 2022-01-13 DIAGNOSIS — E7800 Pure hypercholesterolemia, unspecified: Secondary | ICD-10-CM | POA: Diagnosis not present

## 2022-01-13 DIAGNOSIS — F3342 Major depressive disorder, recurrent, in full remission: Secondary | ICD-10-CM

## 2022-01-13 DIAGNOSIS — E663 Overweight: Secondary | ICD-10-CM | POA: Diagnosis not present

## 2022-01-13 DIAGNOSIS — M170 Bilateral primary osteoarthritis of knee: Secondary | ICD-10-CM

## 2022-01-13 DIAGNOSIS — I10 Essential (primary) hypertension: Secondary | ICD-10-CM | POA: Diagnosis not present

## 2022-01-13 DIAGNOSIS — R7309 Other abnormal glucose: Secondary | ICD-10-CM

## 2022-01-13 DIAGNOSIS — F411 Generalized anxiety disorder: Secondary | ICD-10-CM

## 2022-01-13 DIAGNOSIS — Z0000 Encounter for general adult medical examination without abnormal findings: Secondary | ICD-10-CM

## 2022-01-13 MED ORDER — DICLOFENAC SODIUM 75 MG PO TBEC: 75.0000 mg | DELAYED_RELEASE_TABLET | Freq: Two times a day (BID) | ORAL | 2 refills | Status: DC | PRN

## 2022-01-13 NOTE — Assessment & Plan Note (Signed)
Currently depression is in remission, seems chronic recurrent problem previous Last visit Dr Maryruth Bun Psychiatry Continues to remain off venlafaxine On Sertraline

## 2022-01-13 NOTE — Patient Instructions (Addendum)
Thank you for coming to the office today.  Stop taking Lisinopril 85m. You have the "ACEi cough" this should resolve completely once you stop the med the cough should resolve.  Stay on Sertraline as you are now  Keep track of BP readings, if at goal < 135 / 85 over next few weeks without Lisinopril, then you are good, and do not need any new BP med. You can notify me on mychart if you like, and if it is elevated >135/85 then we can switch to a new BP medication.  Labs today stay tuned for results.  Stop Meloxicam May switch to Diclofenac as needed, max is up to 2 times a day if need Do not take with anything other than Tylenol  START anti inflammatory topical - OTC Voltaren (generic Diclofenac) topical 2-4 times a day as needed for pain swelling of affected joint for 1-2 weeks or longer.  Colon Cancer Screening: - For all adults age 605+routine colon cancer screening is highly recommended.     - [place] 432- Early detection of colon cancer is important, because often there are no warning signs or symptoms, also if found early usually it can be cured. Late stage is hard to treat.  - If you are not interested in Colonoscopy screening (if done and normal you could be cleared for 5 to 10 years until next due), then Cologuard is an excellent alternative for screening test for Colon Cancer. It is highly sensitive for detecting DNA of colon cancer from even the earliest stages. Also, there is NO bowel prep required. - If Cologuard is NEGATIVE, then it is good for 3 years before next due - If Cologuard is POSITIVE, then it is strongly advised to get a Colonoscopy, which allows the GI doctor to locate the source of the cancer or polyp (even very early stage) and treat it by removing it. ------------------------- If you would like to proceed with Cologuard (stool DNA test) - FIRST, call your insurance company and tell them you want to  check cost of Cologuard tell them CPT Code [phone](it may be completely covered and you could get for no cost, OR max cost without any coverage is about $600). Also, keep in mind if you do NOT open the kit, and decide not to do the test, you will NOT be charged, you should contact the company if you decide not to do the test. - If you want to proceed, you can notify [place](phone message, [place] or at next visit) and we will order it for you. The test kit will be delivered to you house within about 1 week. Follow instructions to collect sample, you may call the company for any help or questions, 24/7 telephone support at [phone]  If you want to do Colonoscopy, let me know when / where. We can refer.  Otherwise if not quite due yet that is fine.   Please schedule a Follow-up Appointment to: Return in about 1 year (around 01/13/2023) for 1 year Annual Physical apt fasting lab AFTER.  If you have any other questions or concerns, please feel free to call the office or send a message through [place] You may also schedule an earlier appointment if necessary.  Additionally, you may be receiving a survey about your experience at our office within a few days to 1 week by e-mail or mail. We value your feedback.  ANobie Putnam DO [facility]

## 2022-01-13 NOTE — Assessment & Plan Note (Signed)
Followed by Psychiatry Continue Sertraline Remain off Venlafaxine

## 2022-01-13 NOTE — Assessment & Plan Note (Signed)
Lipids within range ASCVD risk is low Encourage lifestyle No statin

## 2022-01-13 NOTE — Assessment & Plan Note (Signed)
Well-controlled HTN ACEi cough - Home BP readings was elevated, now improving    Plan:  DC Lisinopril 5mg  due to ACEi cough Monitor BP off medication May no longer need rx med Can consider ARB as next option if indicated still based on home readings  Encourage improved lifestyle - low sodium diet, regular exercise Continue monitor BP outside office, bring readings to next visit, if persistently >140/90 or new symptoms notify office sooner

## 2022-01-13 NOTE — Progress Notes (Signed)
Subjective:    Patient ID: [name], female    DOB: September 08, 1961, 61 y.o.   MRN: [id number]  [name] is a 61 y.o. female presenting on 01/13/2022 for Annual Exam   HPI  Insomnia / Anxiety / Stress Major Depression, chronic recurrent - currently in remission Prior history on chart possible Bipolar, mood disorder Followed by Dr Nicolasa Ducking Psychiatry Off Venlafaxine since last 3 weeks due to elevated BP. [name] has remained instead on Sertraline 50mg   Now on Sertraline and doing well No longer taking Trazodone Not taking BDZ either Denies depression, racing thoughts mood swing, agitation, pain breathing problem or other factor for sleep  CHRONIC HTN: Off Venlafaxine thought to raise BP Still taking Lisinopril 5mg  daily but now having ACEi cough and asking about this Home BP readings 100-120 range Reports good compliance, took meds today. Tolerating well, w/o complaints. Lifestyle: - Diet: balanced, unchanged. Low sodium. No caffeine - Exercise: Limited due to time. Denies CP, dyspnea, HA, edema, dizziness / lightheadedness  PMH L TMJ has seen dentist, will need tooth pulled in future has done the dental eval and x-ray already, some improved   Knee Joint pain / Lumbar OA/DJD Spinal stenosis Future apt w [facility] Dr Estanislado Pandy - has upcoming apt 03/2020   Future apt return to [facility] Dr Theda Sers for knee, will proceed with PT     Health Maintenance:  Mammogram scheduled for 03/2022   Colonoscopy 2015 [facility], do not see record however, reported no polyps good for 5 year, they sent her reminder for repeat colonoscopy now [name] will work on this soon.  Pap smear, last done 2019 from [facility] in [place], request record. No abnormal  Shingrix declines for now.  Depression screen [facility] 2/9 01/13/2022 08/01/2021 03/20/2020  Decreased Interest 0 0 0  Down, Depressed, Hopeless 0 0 0  PHQ - 2 Score 0 0 0  Altered sleeping 0 0 1  Tired, decreased energy 0  0 0  Change in appetite 0 0 0  Feeling bad or failure about yourself  0 0 0  Trouble concentrating 0 0 0  Moving slowly or fidgety/restless 0 0 0  Suicidal thoughts 0 0 0  PHQ-9 Score 0 0 1  Difficult doing work/chores Not difficult at all Not difficult at all Not difficult at all    Past Medical History:  Diagnosis Date   Anxiety    Hyperlipidemia    Past Surgical History:  Procedure Laterality Date   BUNIONECTOMY Left    WISDOM TOOTH EXTRACTION     Social History   Socioeconomic History   Marital status: Divorced    Spouse name: Not on file   Number of children: 2   Years of education: Not on file   Highest education level: Some college, no degree  Occupational History   Occupation: [employer]    Comment: full time  Tobacco Use   Smoking status: Never   Smokeless tobacco: Never  Vaping Use   Vaping Use: Never used  Substance and Sexual Activity   Alcohol use: No    Comment: social    Drug use: No   Sexual activity: Never  Other Topics Concern   Not on file  Social History Narrative   Single.   2 children.   Works as Quarry manager at [employer].   Enjoys going to the movies, traveling.   Social Determinants of Health   Financial Resource Strain: Not on file  Food Insecurity: Not  on file  Transportation Needs: Not on file  Physical Activity: Not on file  Stress: Not on file  Social Connections: Not on file  Intimate Partner Violence: Not on file   Family History  Problem Relation Age of Onset   Arthritis Mother    Hypertension Mother    Hyperlipidemia Mother    Hypothyroidism Mother    Heart attack Father 20   Sarcoidosis Father        died of sarcoidosis    Bipolar disorder Brother    Anxiety disorder Brother    Depression Brother    Depression Maternal Aunt    Current Outpatient Medications on File Prior to Visit  Medication Sig   calcium-vitamin D (OSCAL WITH D) 500-200 MG-UNIT per tablet Take 1 tablet by mouth daily. For Vitamin D replacement and  mood control.   fluticasone (FLONASE) 50 MCG/ACT nasal spray PLACE 2 SPRAYS INTO BOTH NOSTRILS DAILY. USE FOR 4-6 WEEKS THEN STOP AND USE SEASONALLY OR AS NEEDED   Multiple Vitamin (MULTIVITAMIN WITH MINERALS) TABS Take 1 tablet by mouth daily. For nutritional supplementation.   sertraline (ZOLOFT) 50 MG tablet Take 50 mg by mouth daily.   Biotin 1000 MCG tablet Take by mouth. (Patient not taking: Reported on 05/06/2020)   No current facility-administered medications on file prior to visit.    Review of Systems  Constitutional:  Negative for activity change, appetite change, chills, diaphoresis, fatigue and fever.  HENT:  Negative for congestion and hearing loss.   Eyes:  Negative for visual disturbance.  Respiratory:  Negative for cough, chest tightness, shortness of breath and wheezing.   Cardiovascular:  Negative for chest pain, palpitations and leg swelling.  Gastrointestinal:  Negative for abdominal pain, constipation, diarrhea, nausea and vomiting.  Genitourinary:  Negative for dysuria, frequency and hematuria.  Musculoskeletal:  Negative for arthralgias and neck pain.  Skin:  Negative for rash.  Neurological:  Negative for dizziness, weakness, light-headedness, numbness and headaches.  Hematological:  Negative for adenopathy.  Psychiatric/Behavioral:  Negative for behavioral problems, dysphoric mood and sleep disturbance.   Per HPI unless specifically indicated above     Objective:    BP 107/71    Pulse 65    Ht 5\' 2"  (1.575 m)    Wt 163 lb 6.4 oz (74.1 kg)    LMP 05/28/2013    SpO2 96%    BMI 29.89 kg/m   Wt Readings from Last 3 Encounters:  01/13/22 163 lb 6.4 oz (74.1 kg)  08/01/21 167 lb 12.8 oz (76.1 kg)  12/06/20 165 lb (74.8 kg)    Physical Exam Vitals and nursing note reviewed.  Constitutional:      General: [name] is not in acute distress.    Appearance: [name] is well-developed. [name] is not diaphoretic.     Comments: Well-appearing, comfortable, cooperative  HENT:      Head: Normocephalic and atraumatic.  Eyes:     General:        Right eye: No discharge.        Left eye: No discharge.     Conjunctiva/sclera: Conjunctivae normal.     Pupils: Pupils are equal, round, and reactive to light.  Neck:     Thyroid: No thyromegaly.  Cardiovascular:     Rate and Rhythm: Normal rate and regular rhythm.     Pulses: Normal pulses.     Heart sounds: Normal heart sounds. No murmur heard. Pulmonary:     Effort: Pulmonary effort is normal. No respiratory distress.  Breath sounds: Normal breath sounds. No wheezing or rales.  Abdominal:     General: Bowel sounds are normal. There is no distension.     Palpations: Abdomen is soft. There is no mass.     Tenderness: There is no abdominal tenderness.  Musculoskeletal:        General: No tenderness. Normal range of motion.     Cervical back: Normal range of motion and neck supple.     Right lower leg: No edema.     Left lower leg: No edema.     Comments: Upper / Lower Extremities: - Normal muscle tone, strength bilateral upper extremities 5/5, lower extremities 5/5  Lymphadenopathy:     Cervical: No cervical adenopathy.  Skin:    General: Skin is warm and dry.     Findings: No erythema or rash.     Comments: Varicose veins, spider veins. Not bothering.  Neurological:     Mental Status: [name] is alert and oriented to person, place, and time.     Comments: Distal sensation intact to light touch all extremities  Psychiatric:        Mood and Affect: Mood normal.        Behavior: Behavior normal.        Thought Content: Thought content normal.     Comments: Well groomed, good eye contact, normal speech and thoughts     Results for orders placed or performed in visit on 10/22/20  Hepatitis C antibody  Result Value Ref Range   Hepatitis C Ab NON-REACTIVE NON-REACTI   SIGNAL TO CUT-OFF 0.01 <1.00  TSH  Result Value Ref Range   TSH 1.70 0.40 - 4.50 mIU/L  Lipid panel  Result Value Ref Range   Cholesterol  223 (H) <200 mg/dL   HDL 68 > OR = 50 mg/dL   Triglycerides 115 <150 mg/dL   LDL Cholesterol (Calc) 132 (H) mg/dL (calc)   Total CHOL/HDL Ratio 3.3 <5.0 (calc)   Non-HDL Cholesterol (Calc) 155 (H) <130 mg/dL (calc)  COMPLETE METABOLIC PANEL WITH GFR  Result Value Ref Range   Glucose, Bld 84 65 - 99 mg/dL   BUN 15 7 - 25 mg/dL   Creat 0.87 0.50 - 1.05 mg/dL   GFR, Est Non African American 74 > OR = 60 mL/min/1.46m2   GFR, Est African American 86 > OR = 60 mL/min/1.64m2   BUN/Creatinine Ratio NOT APPLICABLE 6 - 22 (calc)   Sodium 141 135 - 146 mmol/L   Potassium 4.2 3.5 - 5.3 mmol/L   Chloride 105 98 - 110 mmol/L   CO2 20 20 - 32 mmol/L   Calcium 8.7 8.6 - 10.4 mg/dL   Total Protein 6.7 6.1 - 8.1 g/dL   Albumin 4.3 3.6 - 5.1 g/dL   Globulin 2.4 1.9 - 3.7 g/dL (calc)   AG Ratio 1.8 1.0 - 2.5 (calc)   Total Bilirubin 0.5 0.2 - 1.2 mg/dL   Alkaline phosphatase (APISO) 84 37 - 153 U/L   AST 24 10 - 35 U/L   ALT 20 6 - 29 U/L  CBC with Differential/Platelet  Result Value Ref Range   WBC 6.5 3.8 - 10.8 Thousand/uL   RBC 4.48 3.80 - 5.10 Million/uL   Hemoglobin 13.7 11.7 - 15.5 g/dL   HCT 40.2 35.0 - 45.0 %   MCV 89.7 80.0 - 100.0 fL   MCH 30.6 27.0 - 33.0 pg   MCHC 34.1 32.0 - 36.0 g/dL   RDW 13.1 11.0 - 15.0 %  Platelets 310 140 - 400 Thousand/uL   MPV 9.2 7.5 - 12.5 fL   Neutro Abs 4,459 1,500 - 7,800 cells/uL   Lymphs Abs 1,437 850 - 3,900 cells/uL   Absolute Monocytes 449 200 - 950 cells/uL   Eosinophils Absolute 78 15 - 500 cells/uL   Basophils Absolute 78 0 - 200 cells/uL   Neutrophils Relative % 68.6 %   Total Lymphocyte 22.1 %   Monocytes Relative 6.9 %   Eosinophils Relative 1.2 %   Basophils Relative 1.2 %  Hemoglobin A1c  Result Value Ref Range   Hgb A1c MFr Bld 5.4 <5.7 % of total Hgb   Mean Plasma Glucose 108 (calc)   eAG (mmol/L) 6.0 (calc)      Assessment & Plan:   Problem List Items Addressed This Visit       Chronic   Generalized anxiety  disorder (Chronic)    Followed by Psychiatry Continue Sertraline Remain off Venlafaxine      Relevant Medications   sertraline (ZOLOFT) 50 MG tablet     Other   Overweight (BMI 25.0-29.9)   Relevant Orders   COMPLETE METABOLIC PANEL WITH GFR   Lipid panel   Hemoglobin A1c   Major depression, recurrent, full remission (HCC)    Currently depression is in remission, seems chronic recurrent problem previous Last visit Dr Nicolasa Ducking Psychiatry Continues to remain off venlafaxine On Sertraline      Relevant Medications   sertraline (ZOLOFT) 50 MG tablet   Hyperlipidemia    Lipids within range ASCVD risk is low Encourage lifestyle No statin      Relevant Orders   Lipid panel   TSH   Essential hypertension    Well-controlled HTN ACEi cough - Home BP readings was elevated, now improving    Plan:  DC Lisinopril 5mg  due to ACEi cough Monitor BP off medication May no longer need rx med Can consider ARB as next option if indicated still based on home readings  Encourage improved lifestyle - low sodium diet, regular exercise Continue monitor BP outside office, bring readings to next visit, if persistently >140/90 or new symptoms notify office sooner      Relevant Orders   COMPLETE METABOLIC PANEL WITH GFR   CBC with Differential/Platelet   Other Visit Diagnoses     Annual physical exam    -  Primary   Relevant Orders   COMPLETE METABOLIC PANEL WITH GFR   Lipid panel   CBC with Differential/Platelet   Hemoglobin A1c   Abnormal glucose       Relevant Orders   Hemoglobin A1c   Primary osteoarthritis of both knees       Relevant Medications   diclofenac (VOLTAREN) 75 MG EC tablet       Updated Health Maintenance information Fasting labs today to be drawn. Encouraged improvement to lifestyle with diet and exercise Goal of weight loss  Arthritis, rx Diclofenac switch from Meloxicam and use Voltaren topical PRN   Meds ordered this encounter  Medications    diclofenac (VOLTAREN) 75 MG EC tablet    Sig: Take 1 tablet (75 mg total) by mouth 2 (two) times daily as needed for moderate pain or mild pain.    Dispense:  60 tablet    Refill:  2      Follow up plan: Return in about 1 year (around 01/13/2023) for 1 year Annual Physical apt fasting lab AFTER.  Nobie Putnam, [facility] 01/13/2022, 2:11  PM

## 2022-01-14 LAB — CBC WITH DIFFERENTIAL/PLATELET
Absolute Monocytes: 602 cells/uL (ref 200–950)
Basophils Absolute: 49 cells/uL (ref 0–200)
Basophils Relative: 0.7 %
Eosinophils Absolute: 63 cells/uL (ref 15–500)
Eosinophils Relative: 0.9 %
HCT: 39.7 % (ref 35.0–45.0)
Hemoglobin: 13.1 g/dL (ref 11.7–15.5)
Lymphs Abs: 1610 cells/uL (ref 850–3900)
MCH: 29.2 pg (ref 27.0–33.0)
MCHC: 33 g/dL (ref 32.0–36.0)
MCV: 88.6 fL (ref 80.0–100.0)
MPV: 9.7 fL (ref 7.5–12.5)
Monocytes Relative: 8.6 %
Neutro Abs: 4676 cells/uL (ref 1500–7800)
Neutrophils Relative %: 66.8 %
Platelets: 266 10*3/uL (ref 140–400)
RBC: 4.48 10*6/uL (ref 3.80–5.10)
RDW: 13 % (ref 11.0–15.0)
Total Lymphocyte: 23 %
WBC: 7 10*3/uL (ref 3.8–10.8)

## 2022-01-14 LAB — LIPID PANEL
Cholesterol: 195 mg/dL (ref <200)
HDL: 61 mg/dL (ref >=50)
LDL Cholesterol (Calc): 113 mg/dL (calc) — ABNORMAL HIGH
Non-HDL Cholesterol (Calc): 134 mg/dL (calc) — ABNORMAL HIGH (ref <130)
Total CHOL/HDL Ratio: 3.2 (calc) (ref <5.0)
Triglycerides: 105 mg/dL (ref <150)

## 2022-01-14 LAB — HEMOGLOBIN A1C
Hgb A1c MFr Bld: 5.3 % of total Hgb (ref <5.7)
Mean Plasma Glucose: 105 mg/dL
eAG (mmol/L): 5.8 mmol/L

## 2022-01-14 LAB — COMPLETE METABOLIC PANEL WITH GFR
AG Ratio: 1.9 (calc) (ref 1.0–2.5)
ALT: 13 U/L (ref 6–29)
AST: 17 U/L (ref 10–35)
Albumin: 4.3 g/dL (ref 3.6–5.1)
Alkaline phosphatase (APISO): 56 U/L (ref 37–153)
BUN: 20 mg/dL (ref 7–25)
CO2: 26 mmol/L (ref 20–32)
Calcium: 9 mg/dL (ref 8.6–10.4)
Chloride: 103 mmol/L (ref 98–110)
Creat: 0.67 mg/dL (ref 0.50–1.03)
Globulin: 2.3 g/dL (calc) (ref 1.9–3.7)
Glucose, Bld: 82 mg/dL (ref 65–139)
Potassium: 3.9 mmol/L (ref 3.5–5.3)
Sodium: 138 mmol/L (ref 135–146)
Total Bilirubin: 0.5 mg/dL (ref 0.2–1.2)
Total Protein: 6.6 g/dL (ref 6.1–8.1)
eGFR: 101 mL/min/{1.73_m2} (ref >=60)

## 2022-01-14 LAB — TSH: TSH: 1.21 mIU/L (ref 0.40–4.50)

## 2022-02-02 ENCOUNTER — Encounter: Payer: Self-pay | Admitting: Family Medicine

## 2022-02-02 DIAGNOSIS — I10 Essential (primary) hypertension: Secondary | ICD-10-CM

## 2022-02-02 MED ORDER — OLMESARTAN MEDOXOMIL 5 MG PO TABS: 5.0000 mg | ORAL_TABLET | Freq: Every day | ORAL | 0 refills | Status: DC

## 2022-02-06 ENCOUNTER — Other Ambulatory Visit: Payer: Self-pay

## 2022-02-06 ENCOUNTER — Telehealth (INDEPENDENT_AMBULATORY_CARE_PROVIDER_SITE_OTHER): Payer: BC Managed Care – PPO | Admitting: Family Medicine

## 2022-02-06 ENCOUNTER — Encounter: Payer: Self-pay | Admitting: Family Medicine

## 2022-02-06 VITALS — Ht 62.0 in | Wt 163.0 lb

## 2022-02-06 DIAGNOSIS — J0110 Acute frontal sinusitis, unspecified: Secondary | ICD-10-CM | POA: Diagnosis not present

## 2022-02-06 MED ORDER — AMOXICILLIN-POT CLAVULANATE 875-125 MG PO TABS: 1.0000 | ORAL_TABLET | Freq: Two times a day (BID) | ORAL | 0 refills | Status: DC

## 2022-02-06 NOTE — Progress Notes (Signed)
Virtual Visit via Telephone The purpose of this virtual visit is to provide medical care while limiting exposure to the novel coronavirus (COVID19) for both patient and office staff.  Consent was obtained for phone visit:  Yes.   Answered questions that patient had about telehealth interaction:  Yes.   I discussed the limitations, risks, security and privacy concerns of performing an evaluation and management service by telephone. I also discussed with the patient that there may be a patient responsible charge related to this service. The patient expressed understanding and agreed to proceed.  Patient Location: Home Provider Location: [facility] (Office)  Participants in virtual visit: - Patient: [name] - CMA: Burnell Blanks, CMA - Provider: Dr Althea Charon  ---------------------------------------------------------------------- Chief Complaint  Patient presents with   Nasal Congestion   Sore Throat   Cough    S: Reviewed CMA documentation. I have called patient and gathered additional HPI as follows:  Sinusitis Reports that symptoms started onset within past week, now improving. - Tried OTC Mucinex - Has Flonase  Denies any known or suspected exposure to person with or possibly with COVID19.  sinus pain or pressure  Denies any fevers, chills, sweats, body ache, cough, shortness of breath, headache, abdominal pain, diarrhea  Past Medical History:  Diagnosis Date   Anxiety    Hyperlipidemia    Social History   Tobacco Use   Smoking status: Never   Smokeless tobacco: Never  Vaping Use   Vaping Use: Never used  Substance Use Topics   Alcohol use: No    Comment: social    Drug use: No    Current Outpatient Medications:    amoxicillin-clavulanate (AUGMENTIN) 875-125 MG tablet, Take 1 tablet by mouth 2 (two) times daily., Disp: 14 tablet, Rfl: 0   Biotin 1000 MCG tablet, Take by mouth. (Patient not taking: Reported on 05/06/2020), Disp: , Rfl:     calcium-vitamin D (OSCAL WITH D) 500-200 MG-UNIT per tablet, Take 1 tablet by mouth daily. For Vitamin D replacement and mood control., Disp: 30 tablet, Rfl: 0   diclofenac (VOLTAREN) 75 MG EC tablet, Take 1 tablet (75 mg total) by mouth 2 (two) times daily as needed for moderate pain or mild pain., Disp: 60 tablet, Rfl: 2   fluticasone (FLONASE) 50 MCG/ACT nasal spray, PLACE 2 SPRAYS INTO BOTH NOSTRILS DAILY. USE FOR 4-6 WEEKS THEN STOP AND USE SEASONALLY OR AS NEEDED, Disp: 48 mL, Rfl: 1   Multiple Vitamin (MULTIVITAMIN WITH MINERALS) TABS, Take 1 tablet by mouth daily. For nutritional supplementation., Disp: 30 tablet, Rfl: 0   olmesartan (BENICAR) 5 MG tablet, Take 1 tablet (5 mg total) by mouth daily., Disp: 30 tablet, Rfl: 0   sertraline (ZOLOFT) 50 MG tablet, Take 50 mg by mouth daily., Disp: , Rfl:   Depression screen [facility] 2/9 01/13/2022 08/01/2021 03/20/2020  Decreased Interest 0 0 0  Down, Depressed, Hopeless 0 0 0  PHQ - 2 Score 0 0 0  Altered sleeping 0 0 1  Tired, decreased energy 0 0 0  Change in appetite 0 0 0  Feeling bad or failure about yourself  0 0 0  Trouble concentrating 0 0 0  Moving slowly or fidgety/restless 0 0 0  Suicidal thoughts 0 0 0  PHQ-9 Score 0 0 1  Difficult doing work/chores Not difficult at all Not difficult at all Not difficult at all    GAD 7 : Generalized Anxiety Score 01/13/2022 08/01/2021 03/20/2020 02/28/2020  Nervous, Anxious, on Edge  0 0 2 0  Control/stop worrying 0 0 0 0  Worry too much - different things 0 0 1 0  Trouble relaxing 0 0 1 0  Restless 0 0 0 0  Easily annoyed or irritable 0 0 0 2  Afraid - awful might happen 0 0 0 0  Total GAD 7 Score 0 0 4 2  Anxiety Difficulty Not difficult at all Not difficult at all Somewhat difficult Not difficult at all    -------------------------------------------------------------------------- O: No physical exam performed due to remote telephone encounter.  Lab results reviewed.  Recent Results (from the  past 2160 hour(s))  COMPLETE METABOLIC PANEL WITH GFR     Status: None   Collection Time: 01/13/22  2:27 PM  Result Value Ref Range   Glucose, Bld 82 65 - 139 mg/dL    Comment: .        Non-fasting reference interval .    BUN 20 7 - 25 mg/dL   Creat 3.24 4.01 - 0.27 mg/dL   eGFR 253 > OR = 60 GU/YQI/3.47Q2    Comment: The eGFR is based on the CKD-EPI 2021 equation. To calculate  the new eGFR from a previous Creatinine or Cystatin C result, go to https://www.kidney.org/professionals/ kdoqi/gfr%5Fcalculator    BUN/Creatinine Ratio NOT APPLICABLE 6 - 22 (calc)   Sodium 138 135 - 146 mmol/L   Potassium 3.9 3.5 - 5.3 mmol/L   Chloride 103 98 - 110 mmol/L   CO2 26 20 - 32 mmol/L   Calcium 9.0 8.6 - 10.4 mg/dL   Total Protein 6.6 6.1 - 8.1 g/dL   Albumin 4.3 3.6 - 5.1 g/dL   Globulin 2.3 1.9 - 3.7 g/dL (calc)   AG Ratio 1.9 1.0 - 2.5 (calc)   Total Bilirubin 0.5 0.2 - 1.2 mg/dL   Alkaline phosphatase (APISO) 56 37 - 153 U/L   AST 17 10 - 35 U/L   ALT 13 6 - 29 U/L  Lipid panel     Status: Abnormal   Collection Time: 01/13/22  2:27 PM  Result Value Ref Range   Cholesterol 195 <200 mg/dL   HDL 61 > OR = 50 mg/dL   Triglycerides 595 <638 mg/dL   LDL Cholesterol (Calc) 113 (H) mg/dL (calc)    Comment: Reference range: <100 . Desirable range <100 mg/dL for primary prevention;   <70 mg/dL for patients with CHD or diabetic patients  with > or = 2 CHD risk factors. Marland Kitchen LDL-C is now calculated using the Martin-Hopkins  calculation, which is a validated novel method providing  better accuracy than the Friedewald equation in the  estimation of LDL-C.  Horald Pollen et al. Lenox Ahr. 7564;332(95): 2061-2068  (http://education.[facility].com/faq/FAQ164)    Total CHOL/HDL Ratio 3.2 <5.0 (calc)   Non-HDL Cholesterol (Calc) 134 (H) <130 mg/dL (calc)    Comment: For patients with diabetes plus 1 major ASCVD risk  factor, treating to a non-HDL-C goal of <100 mg/dL  (LDL-C of <18 mg/dL) is  considered a therapeutic  option.   CBC with Differential/Platelet     Status: None   Collection Time: 01/13/22  2:27 PM  Result Value Ref Range   WBC 7.0 3.8 - 10.8 Thousand/uL   RBC 4.48 3.80 - 5.10 Million/uL   Hemoglobin 13.1 11.7 - 15.5 g/dL   HCT 84.1 66.0 - 63.0 %   MCV 88.6 80.0 - 100.0 fL   MCH 29.2 27.0 - 33.0 pg   MCHC 33.0 32.0 - 36.0 g/dL   RDW 13.0  11.0 - 15.0 %   Platelets 266 140 - 400 Thousand/uL   MPV 9.7 7.5 - 12.5 fL   Neutro Abs 4,676 1,500 - 7,800 cells/uL   Lymphs Abs 1,610 850 - 3,900 cells/uL   Absolute Monocytes 602 200 - 950 cells/uL   Eosinophils Absolute 63 15 - 500 cells/uL   Basophils Absolute 49 0 - 200 cells/uL   Neutrophils Relative % 66.8 %   Total Lymphocyte 23.0 %   Monocytes Relative 8.6 %   Eosinophils Relative 0.9 %   Basophils Relative 0.7 %  Hemoglobin A1c     Status: None   Collection Time: 01/13/22  2:27 PM  Result Value Ref Range   Hgb A1c MFr Bld 5.3 <5.7 % of total Hgb    Comment: For the purpose of screening for the presence of diabetes: . <5.7%       Consistent with the absence of diabetes 5.7-6.4%    Consistent with increased risk for diabetes             (prediabetes) > or =6.5%  Consistent with diabetes . This assay result is consistent with a decreased risk of diabetes. . Currently, no consensus exists regarding use of hemoglobin A1c for diagnosis of diabetes in children. . According to American Diabetes Association (ADA) guidelines, hemoglobin A1c <7.0% represents optimal control in non-pregnant diabetic patients. Different metrics may apply to specific patient populations.  Standards of Medical Care in Diabetes(ADA). .    Mean Plasma Glucose 105 mg/dL   eAG (mmol/L) 5.8 mmol/L  TSH     Status: None   Collection Time: 01/13/22  2:27 PM  Result Value Ref Range   TSH 1.21 0.40 - 4.50 mIU/L    -------------------------------------------------------------------------- A&P:  Problem List Items Addressed  This Visit   None Visit Diagnoses     Acute non-recurrent frontal sinusitis    -  Primary   Relevant Medications   amoxicillin-clavulanate (AUGMENTIN) 875-125 MG tablet      Sinusitis mild, likely initially viral Improving now Use OTC mucinex, flonase If not improved can fill Rx Augmentin BID x 7 days  Meds ordered this encounter  Medications   amoxicillin-clavulanate (AUGMENTIN) 875-125 MG tablet    Sig: Take 1 tablet by mouth 2 (two) times daily.    Dispense:  14 tablet    Refill:  0    Follow-up: PRN  Patient verbalizes understanding with the above medical recommendations including the limitation of remote medical advice.  Specific follow-up and call-back criteria were given for patient to follow-up or seek medical care more urgently if needed.   - Time spent in direct consultation with patient on phone: 7 minutes   Saralyn Pilar, DO [facility] 02/06/2022, 4:09 PM

## 2022-02-06 NOTE — Patient Instructions (Signed)
° °  Please schedule a Follow-up Appointment to: No follow-ups on file. ° °If you have any other questions or concerns, please feel free to call the office or send a message through MyChart. You may also schedule an earlier appointment if necessary. ° °Additionally, you may be receiving a survey about your experience at our office within a few days to 1 week by e-mail or mail. We value your feedback. ° °Micahel Omlor, DO °[facility] °

## 2022-02-25 ENCOUNTER — Other Ambulatory Visit: Payer: Self-pay | Admitting: Family Medicine

## 2022-02-25 DIAGNOSIS — I10 Essential (primary) hypertension: Secondary | ICD-10-CM

## 2022-02-26 NOTE — Telephone Encounter (Signed)
Requested Prescriptions  ?Pending Prescriptions Disp Refills  ?? olmesartan (BENICAR) 5 MG tablet [Pharmacy Med Name: OLMESARTAN MEDOXOMIL 5 MG TAB] 90 tablet 0  ?  Sig: TAKE 1 TABLET (5 MG TOTAL) BY MOUTH DAILY.  ?  ? Cardiovascular:  Angiotensin Receptor Blockers Passed - 02/25/2022  8:33 AM  ?  ?  Passed - Cr in normal range and within 180 days  ?  Creat  ?Date Value Ref Range Status  ?01/13/2022 0.67 0.50 - 1.03 mg/dL Final  ?   ?  ?  Passed - K in normal range and within 180 days  ?  Potassium  ?Date Value Ref Range Status  ?01/13/2022 3.9 3.5 - 5.3 mmol/L Final  ?08/30/2012 3.6 3.5 - 5.1 mmol/L Final  ?   ?  ?  Passed - Patient is not pregnant  ?  ?  Passed - Last BP in normal range  ?  BP Readings from Last 1 Encounters:  ?01/13/22 107/71  ?   ?  ?  Passed - Valid encounter within last 6 months  ?  Recent Outpatient Visits   ?      ? 2 weeks ago Acute non-recurrent frontal sinusitis  ? [facility] Smitty Cords, DO  ? 1 month ago Annual physical exam  ? [facility] Smitty Cords, DO  ? 6 months ago TMJ (temporomandibular joint syndrome)  ? [facility] [place], Netta Neat, DO  ? 1 year ago Acute non-recurrent frontal sinusitis  ? [facility] [place], Netta Neat, DO  ? 1 year ago Left facial swelling  ? [facility], Jodelle Gross, FNP  ?  ?  ? ?  ?  ?  ? ?Called pt and LM on VM. Informed her it was time to do a 90 day refill if she was doing well. Advised pt to call and update if she is not doing well on new medication. ? ?

## 2022-03-11 ENCOUNTER — Encounter: Payer: Self-pay | Admitting: Family Medicine

## 2022-03-11 DIAGNOSIS — I10 Essential (primary) hypertension: Secondary | ICD-10-CM

## 2022-03-11 MED ORDER — OLMESARTAN MEDOXOMIL 20 MG PO TABS: 20.0000 mg | ORAL_TABLET | Freq: Every day | ORAL | 2 refills | Status: DC

## 2022-04-09 ENCOUNTER — Other Ambulatory Visit: Payer: Self-pay | Admitting: Family Medicine

## 2022-04-09 DIAGNOSIS — M170 Bilateral primary osteoarthritis of knee: Secondary | ICD-10-CM

## 2022-04-09 NOTE — Telephone Encounter (Signed)
Requested Prescriptions  Pending Prescriptions Disp Refills  . diclofenac (VOLTAREN) 75 MG EC tablet [Pharmacy Med Name: DICLOFENAC SOD EC 75 MG TAB] 60 tablet 2    Sig: TAKE 1 TABLET (75 MG TOTAL) BY MOUTH 2 (TWO) TIMES DAILY AS NEEDED FOR MODERATE PAIN OR MILD PAIN.     Analgesics:  NSAIDS Failed - 04/09/2022  2:18 AM      Failed - Manual Review: Labs are only required if the patient has taken medication for more than 8 weeks.      Passed - Cr in normal range and within 360 days    Creat  Date Value Ref Range Status  01/13/2022 0.67 0.50 - 1.03 mg/dL Final         Passed - HGB in normal range and within 360 days    Hemoglobin  Date Value Ref Range Status  01/13/2022 13.1 11.7 - 15.5 g/dL Final   HGB  Date Value Ref Range Status  08/30/2012 14.2 12.0 - 16.0 g/dL Final         Passed - PLT in normal range and within 360 days    Platelets  Date Value Ref Range Status  01/13/2022 266 140 - 400 Thousand/uL Final   Platelet  Date Value Ref Range Status  08/30/2012 298 150 - 440 x10 3/mm 3 Final         Passed - HCT in normal range and within 360 days    HCT  Date Value Ref Range Status  01/13/2022 39.7 35.0 - 45.0 % Final  08/30/2012 41.4 35.0 - 47.0 % Final         Passed - eGFR is 30 or above and within 360 days    GFR, Est African American  Date Value Ref Range Status  10/24/2020 86 > OR = 60 mL/min/1.42m Final   GFR, Est Non African American  Date Value Ref Range Status  10/24/2020 74 > OR = 60 mL/min/1.749mFinal   GFR  Date Value Ref Range Status  03/30/2018 87.93 >60.00 mL/min Final   eGFR  Date Value Ref Range Status  01/13/2022 101 > OR = 60 mL/min/1.7339minal    Comment:    The eGFR is based on the CKD-EPI 2021 equation. To calculate  the new eGFR from a previous Creatinine or Cystatin C result, go to https://www.kidney.org/professionals/ kdoqi/gfr%5Fcalculator          Passed - Patient is not pregnant      Passed - Valid encounter within  last 12 months    Recent Outpatient Visits          2 months ago Acute non-recurrent frontal sinusitis   [place]   2 months ago Annual physical exam   [facility]   8 months ago TMJ (temporomandibular joint syndrome)   [place]   1 year ago Acute non-recurrent frontal sinusitis   [place]   1 year ago Left facial swelling   [facility] [place]

## 2022-05-26 ENCOUNTER — Other Ambulatory Visit: Payer: Self-pay | Admitting: Family Medicine

## 2022-05-26 DIAGNOSIS — I10 Essential (primary) hypertension: Secondary | ICD-10-CM

## 2022-05-27 NOTE — Telephone Encounter (Signed)
Requested Prescriptions  Pending Prescriptions Disp Refills  . olmesartan (BENICAR) 20 MG tablet [Pharmacy Med Name: OLMESARTAN MEDOXOMIL 20 MG TAB] 90 tablet 0    Sig: TAKE 1 TABLET BY MOUTH EVERY DAY     Cardiovascular:  Angiotensin Receptor Blockers Passed - 05/26/2022 10:13 AM      Passed - Cr in normal range and within 180 days    Creat  Date Value Ref Range Status  01/13/2022 0.67 0.50 - 1.03 mg/dL Final         Passed - K in normal range and within 180 days    Potassium  Date Value Ref Range Status  01/13/2022 3.9 3.5 - 5.3 mmol/L Final  08/30/2012 3.6 3.5 - 5.1 mmol/L Final         Passed - Patient is not pregnant      Passed - Last BP in normal range    BP Readings from Last 1 Encounters:  01/13/22 107/71         Passed - Valid encounter within last 6 months    Recent Outpatient Visits          3 months ago Acute non-recurrent frontal sinusitis   [facility] Smitty Cords, DO   4 months ago Annual physical exam   [facility] Smitty Cords, DO   9 months ago TMJ (temporomandibular joint syndrome)   [facility] Smitty Cords, DO   1 year ago Acute non-recurrent frontal sinusitis   [facility] [place], Netta Neat, DO   2 years ago Left facial swelling   [facility], Jodelle Gross, [place]

## 2022-08-01 ENCOUNTER — Other Ambulatory Visit: Payer: Self-pay | Admitting: Family Medicine

## 2022-08-01 DIAGNOSIS — M170 Bilateral primary osteoarthritis of knee: Secondary | ICD-10-CM

## 2022-08-04 NOTE — Telephone Encounter (Signed)
Requested Prescriptions  Pending Prescriptions Disp Refills  . diclofenac (VOLTAREN) 75 MG EC tablet [Pharmacy Med Name: DICLOFENAC SOD EC 75 MG TAB] 180 tablet 0    Sig: TAKE 1 TABLET (75 MG TOTAL) BY MOUTH 2 (TWO) TIMES DAILY AS NEEDED FOR MODERATE PAIN OR MILD PAIN.     Analgesics:  NSAIDS Failed - 08/01/2022  9:36 AM      Failed - Manual Review: Labs are only required if the patient has taken medication for more than 8 weeks.      Passed - Cr in normal range and within 360 days    Creat  Date Value Ref Range Status  01/13/2022 0.67 0.50 - 1.03 mg/dL Final         Passed - HGB in normal range and within 360 days    Hemoglobin  Date Value Ref Range Status  01/13/2022 13.1 11.7 - 15.5 g/dL Final   HGB  Date Value Ref Range Status  08/30/2012 14.2 12.0 - 16.0 g/dL Final         Passed - PLT in normal range and within 360 days    Platelets  Date Value Ref Range Status  01/13/2022 266 140 - 400 Thousand/uL Final   Platelet  Date Value Ref Range Status  08/30/2012 298 150 - 440 x10 3/mm 3 Final         Passed - HCT in normal range and within 360 days    HCT  Date Value Ref Range Status  01/13/2022 39.7 35.0 - 45.0 % Final  08/30/2012 41.4 35.0 - 47.0 % Final         Passed - eGFR is 30 or above and within 360 days    GFR, Est African American  Date Value Ref Range Status  10/24/2020 86 > OR = 60 mL/min/1.57m Final   GFR, Est Non African American  Date Value Ref Range Status  10/24/2020 74 > OR = 60 mL/min/1.72mFinal   GFR  Date Value Ref Range Status  03/30/2018 87.93 >60.00 mL/min Final   eGFR  Date Value Ref Range Status  01/13/2022 101 > OR = 60 mL/min/1.7348minal    Comment:    The eGFR is based on the CKD-EPI 2021 equation. To calculate  the new eGFR from a previous Creatinine or Cystatin C result, go to https://www.kidney.org/professionals/ kdoqi/gfr%5Fcalculator          Passed - Patient is not pregnant      Passed - Valid encounter within  last 12 months    Recent Outpatient Visits          5 months ago Acute non-recurrent frontal sinusitis   [facility]   6 months ago Annual physical exam   [facility]   1 year ago TMJ (temporomandibular joint syndrome)   [place]   1 year ago Acute non-recurrent frontal sinusitis   [place]   2 years ago Left facial swelling   [facility] [place]

## 2022-08-27 ENCOUNTER — Other Ambulatory Visit: Payer: Self-pay | Admitting: Family Medicine

## 2022-08-27 DIAGNOSIS — I10 Essential (primary) hypertension: Secondary | ICD-10-CM

## 2022-08-27 NOTE — Telephone Encounter (Signed)
Attempted to call patient to schedule appointment- left message to call office. Courtesy 30 day Rx sent to pharmacy Requested Prescriptions  Pending Prescriptions Disp Refills  . olmesartan (BENICAR) 20 MG tablet [Pharmacy Med Name: OLMESARTAN MEDOXOMIL 20 MG TAB] 30 tablet 0    Sig: TAKE 1 TABLET BY MOUTH EVERY DAY     Cardiovascular:  Angiotensin Receptor Blockers Failed - 08/27/2022  2:48 AM      Failed - Cr in normal range and within 180 days    Creat  Date Value Ref Range Status  01/13/2022 0.67 0.50 - 1.03 mg/dL Final         Failed - K in normal range and within 180 days    Potassium  Date Value Ref Range Status  01/13/2022 3.9 3.5 - 5.3 mmol/L Final  08/30/2012 3.6 3.5 - 5.1 mmol/L Final         Failed - Valid encounter within last 6 months    Recent Outpatient Visits          6 months ago Acute non-recurrent frontal sinusitis   [place], DO   7 months ago Annual physical exam   [place], DO   1 year ago TMJ (temporomandibular joint syndrome)   [place], DO   1 year ago Acute non-recurrent frontal sinusitis   [facility] [place], Devonne Doughty, DO   2 years ago Left facial swelling   [employer], [place], [place]             Passed - Patient is not pregnant      Passed - Last BP in normal range    BP Readings from Last 1 Encounters:  01/13/22 107/71

## 2022-09-10 ENCOUNTER — Other Ambulatory Visit: Payer: Self-pay | Admitting: Family Medicine

## 2022-09-10 DIAGNOSIS — I10 Essential (primary) hypertension: Secondary | ICD-10-CM

## 2022-09-10 NOTE — Telephone Encounter (Signed)
Requested Prescriptions  Pending Prescriptions Disp Refills  . olmesartan (BENICAR) 20 MG tablet [Pharmacy Med Name: OLMESARTAN MEDOXOMIL 20 MG TAB] 90 tablet 1    Sig: TAKE 1 TABLET BY MOUTH EVERY DAY     Cardiovascular:  Angiotensin Receptor Blockers Failed - 09/10/2022  8:57 AM      Failed - Cr in normal range and within 180 days    Creat  Date Value Ref Range Status  01/13/2022 0.67 0.50 - 1.03 mg/dL Final         Failed - K in normal range and within 180 days    Potassium  Date Value Ref Range Status  01/13/2022 3.9 3.5 - 5.3 mmol/L Final  08/30/2012 3.6 3.5 - 5.1 mmol/L Final         Failed - Valid encounter within last 6 months    Recent Outpatient Visits          7 months ago Acute non-recurrent frontal sinusitis   [place], DO   8 months ago Annual physical exam   [place], DO   1 year ago TMJ (temporomandibular joint syndrome)   [place], DO   1 year ago Acute non-recurrent frontal sinusitis   [facility] [place], Devonne Doughty, DO   2 years ago Left facial swelling   [place], [place], [place]             Passed - Patient is not pregnant      Passed - Last BP in normal range    BP Readings from Last 1 Encounters:  01/13/22 107/71

## 2022-10-22 ENCOUNTER — Other Ambulatory Visit: Payer: Self-pay | Admitting: Family Medicine

## 2022-10-22 DIAGNOSIS — M170 Bilateral primary osteoarthritis of knee: Secondary | ICD-10-CM

## 2022-10-22 NOTE — Telephone Encounter (Signed)
Requested Prescriptions  Pending Prescriptions Disp Refills   diclofenac (VOLTAREN) 75 MG EC tablet [Pharmacy Med Name: DICLOFENAC SOD EC 75 MG TAB] 180 tablet 0    Sig: TAKE 1 TABLET (75 MG TOTAL) BY MOUTH 2 (TWO) TIMES DAILY AS NEEDED FOR MODERATE PAIN OR MILD PAIN.     Analgesics:  NSAIDS Failed - 10/22/2022  9:03 AM      Failed - Manual Review: Labs are only required if the patient has taken medication for more than 8 weeks.      Passed - Cr in normal range and within 360 days    Creat  Date Value Ref Range Status  01/13/2022 0.67 0.50 - 1.03 mg/dL Final         Passed - HGB in normal range and within 360 days    Hemoglobin  Date Value Ref Range Status  01/13/2022 13.1 11.7 - 15.5 g/dL Final   HGB  Date Value Ref Range Status  08/30/2012 14.2 12.0 - 16.0 g/dL Final         Passed - PLT in normal range and within 360 days    Platelets  Date Value Ref Range Status  01/13/2022 266 140 - 400 Thousand/uL Final   Platelet  Date Value Ref Range Status  08/30/2012 298 150 - 440 x10 3/mm 3 Final         Passed - HCT in normal range and within 360 days    HCT  Date Value Ref Range Status  01/13/2022 39.7 35.0 - 45.0 % Final  08/30/2012 41.4 35.0 - 47.0 % Final         Passed - eGFR is 30 or above and within 360 days    GFR, Est African American  Date Value Ref Range Status  10/24/2020 86 > OR = 60 mL/min/1.31m Final   GFR, Est Non African American  Date Value Ref Range Status  10/24/2020 74 > OR = 60 mL/min/1.739mFinal   GFR  Date Value Ref Range Status  03/30/2018 87.93 >60.00 mL/min Final   eGFR  Date Value Ref Range Status  01/13/2022 101 > OR = 60 mL/min/1.7347minal    Comment:    The eGFR is based on the CKD-EPI 2021 equation. To calculate  the new eGFR from a previous Creatinine or Cystatin C result, go to https://www.kidney.org/professionals/ kdoqi/gfr%5Fcalculator          Passed - Patient is not pregnant      Passed - Valid encounter within  last 12 months    Recent Outpatient Visits           8 months ago Acute non-recurrent frontal sinusitis   [place]   9 months ago Annual physical exam   [facility]   1 year ago TMJ (temporomandibular joint syndrome)   [place]   1 year ago Acute non-recurrent frontal sinusitis   [place]   2 years ago Left facial swelling   [facility] [place]ider[facility]

## 2022-10-27 ENCOUNTER — Other Ambulatory Visit: Payer: Self-pay | Admitting: Family Medicine

## 2022-10-27 DIAGNOSIS — I10 Essential (primary) hypertension: Secondary | ICD-10-CM

## 2022-10-27 NOTE — Telephone Encounter (Signed)
Requested medications are due for refill today.  yes  Requested medications are on the active medications list.  yes  Last refill. 08/27/2022 #30 0 rf  Future visit scheduled.   no  Notes to clinic.  Pt already given a courtesy refill    Requested Prescriptions  Pending Prescriptions Disp Refills   olmesartan (BENICAR) 20 MG tablet [Pharmacy Med Name: OLMESARTAN MEDOXOMIL 20 MG TAB] 90 tablet 1    Sig: TAKE 1 TABLET BY MOUTH EVERY DAY     Cardiovascular:  Angiotensin Receptor Blockers Failed - 10/27/2022  1:32 PM      Failed - Cr in normal range and within 180 days    Creat  Date Value Ref Range Status  01/13/2022 0.67 0.50 - 1.03 mg/dL Final         Failed - K in normal range and within 180 days    Potassium  Date Value Ref Range Status  01/13/2022 3.9 3.5 - 5.3 mmol/L Final  08/30/2012 3.6 3.5 - 5.1 mmol/L Final         Failed - Valid encounter within last 6 months    Recent Outpatient Visits           8 months ago Acute non-recurrent frontal sinusitis   [facility] Smitty Cords, DO   9 months ago Annual physical exam   [facility] Smitty Cords, DO   1 year ago TMJ (temporomandibular joint syndrome)   [facility] Smitty Cords, DO   1 year ago Acute non-recurrent frontal sinusitis   [facility] [place], Netta Neat, DO   2 years ago Left facial swelling   [facility] [place], [place], [place]              Passed - Patient is not pregnant      Passed - Last BP in normal range    BP Readings from Last 1 Encounters:  01/13/22 107/71

## 2022-11-26 ENCOUNTER — Ambulatory Visit: Payer: BC Managed Care – PPO | Admitting: Family Medicine

## 2022-11-26 ENCOUNTER — Encounter: Payer: Self-pay | Admitting: Family Medicine

## 2022-11-26 VITALS — BP 118/70 | HR 64 | Ht 62.0 in | Wt 170.0 lb

## 2022-11-26 DIAGNOSIS — J3089 Other allergic rhinitis: Secondary | ICD-10-CM | POA: Diagnosis not present

## 2022-11-26 DIAGNOSIS — I10 Essential (primary) hypertension: Secondary | ICD-10-CM

## 2022-11-26 DIAGNOSIS — Z23 Encounter for immunization: Secondary | ICD-10-CM

## 2022-11-26 DIAGNOSIS — M170 Bilateral primary osteoarthritis of knee: Secondary | ICD-10-CM

## 2022-11-26 MED ORDER — FLUTICASONE PROPIONATE 50 MCG/ACT NA SUSP: 2.0000 | Freq: Every day | NASAL | 3 refills | Status: DC

## 2022-11-26 MED ORDER — DICLOFENAC SODIUM 75 MG PO TBEC: 75.0000 mg | DELAYED_RELEASE_TABLET | Freq: Two times a day (BID) | ORAL | 3 refills | Status: AC | PRN

## 2022-11-26 NOTE — Progress Notes (Signed)
Subjective:    Patient ID: [name], female    DOB: 1961/11/03, 62 y.o.   MRN: [id number]  TAMANIKA HEINEY is a 62 y.o. female presenting on 11/26/2022 for Hypertension   HPI  Insomnia / Anxiety / Stress Major Depression, chronic recurrent - currently in remission Prior history on chart possible Bipolar, mood disorder Followed by Dr Nicolasa Ducking Psychiatry Off Venlafaxine since last 3 weeks due to elevated BP. 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  Off Venlafaxine thought to raise BP Reports good compliance, took meds today. Tolerating well, w/o complaints. Lifestyle: - Diet: balanced, unchanged. Low sodium. No caffeine - Exercise: Limited due to time. Denies CP, dyspnea, HA, edema, dizziness / lightheadedness   Arthritis Voltaren, AS NEEDED use re ordered refills not taking every day        01/13/2022    2:03 PM 08/01/2021   11:03 AM 03/20/2020   10:25 AM  Depression screen PHQ 2/9  Decreased Interest 0 0 0  Down, Depressed, Hopeless 0 0 0  PHQ - 2 Score 0 0 0  Altered sleeping 0 0 1  Tired, decreased energy 0 0 0  Change in appetite 0 0 0  Feeling bad or failure about yourself  0 0 0  Trouble concentrating 0 0 0  Moving slowly or fidgety/restless 0 0 0  Suicidal thoughts 0 0 0  PHQ-9 Score 0 0 1  Difficult doing work/chores Not difficult at all Not difficult at all Not difficult at all    Social History    Tobacco Use   Smoking status: Never   Smokeless tobacco: Never  Vaping Use   Vaping Use: Never used  Substance Use Topics   Alcohol use: No    Comment: social    Drug use: No    Review of Systems Per HPI unless specifically indicated above     Objective:    BP 118/70   Pulse 64   Ht _0  (1.575 m)   Wt 170 lb (77.1 kg)   LMP 05/28/2013   SpO2 94%   BMI 31.09 kg/m   Wt Readings from Last 3 Encounters:  11/26/22 170 lb (77.1 kg)  02/06/22 163 lb (73.9 kg)  01/13/22 163 lb 6.4 oz (74.1 kg)    Physical Exam Vitals and nursing note reviewed.  Constitutional:      General: She is not in acute distress.    Appearance: She is well-developed. She is not diaphoretic.     Comments: Well-appearing, comfortable, cooperative  HENT:     Head: Normocephalic and atraumatic.  Eyes:     General:        Right eye: No discharge.        Left eye: No discharge.     Conjunctiva/sclera: Conjunctivae normal.  Neck:     Thyroid: No thyromegaly.  Cardiovascular:     Rate and Rhythm: Normal rate and regular rhythm.     Heart  sounds: Normal heart sounds. No murmur heard. Pulmonary:     Effort: Pulmonary effort is normal. No respiratory distress.     Breath sounds: Normal breath sounds. No wheezing or rales.  Musculoskeletal:        General: Normal range of motion.     Cervical back: Normal range of motion and neck supple.  Lymphadenopathy:     Cervical: No cervical adenopathy.  Skin:    General: Skin is warm and dry.     Findings: No erythema or rash.  Neurological:     Mental Status: She is alert and oriented to person, place, and time.  Psychiatric:        Behavior: Behavior normal.     Comments: Well groomed, good eye contact, normal speech and thoughts    Results for orders placed or performed in visit on 01/13/22  COMPLETE METABOLIC PANEL WITH GFR  Result Value Ref Range   Glucose, Bld 82 65 - 139 mg/dL   BUN 20 7 - 25 mg/dL   Creat 0.67 0.50 - 1.03 mg/dL   eGFR  101 > OR = 60 mL/min/1.81m   BUN/Creatinine Ratio NOT APPLICABLE 6 - 22 (calc)   Sodium 138 135 - 146 mmol/L   Potassium 3.9 3.5 - 5.3 mmol/L   Chloride 103 98 - 110 mmol/L   CO2 26 20 - 32 mmol/L   Calcium 9.0 8.6 - 10.4 mg/dL   Total Protein 6.6 6.1 - 8.1 g/dL   Albumin 4.3 3.6 - 5.1 g/dL   Globulin 2.3 1.9 - 3.7 g/dL (calc)   AG Ratio 1.9 1.0 - 2.5 (calc)   Total Bilirubin 0.5 0.2 - 1.2 mg/dL   Alkaline phosphatase (APISO) 56 37 - 153 U/L   AST 17 10 - 35 U/L   ALT 13 6 - 29 U/L  Lipid panel  Result Value Ref Range   Cholesterol 195 <200 mg/dL   HDL 61 > OR = 50 mg/dL   Triglycerides 105 <150 mg/dL   LDL Cholesterol (Calc) 113 (H) mg/dL (calc)   Total CHOL/HDL Ratio 3.2 <5.0 (calc)   Non-HDL Cholesterol (Calc) 134 (H) <130 mg/dL (calc)  CBC with Differential/Platelet  Result Value Ref Range   WBC 7.0 3.8 - 10.8 Thousand/uL   RBC 4.48 3.80 - 5.10 Million/uL   Hemoglobin 13.1 11.7 - 15.5 g/dL   HCT 39.7 35.0 - 45.0 %   MCV 88.6 80.0 - 100.0 fL   MCH 29.2 27.0 - 33.0 pg   MCHC 33.0 32.0 - 36.0 g/dL   RDW 13.0 11.0 - 15.0 %   Platelets 266 140 - 400 Thousand/uL   MPV 9.7 7.5 - 12.5 fL   Neutro Abs 4,676 1,500 - 7,800 cells/uL   Lymphs Abs 1,610 850 - 3,900 cells/uL   Absolute Monocytes 602 200 - 950 cells/uL   Eosinophils Absolute 63 15 - 500 cells/uL   Basophils Absolute 49 0 - 200 cells/uL   Neutrophils Relative % 66.8 %   Total Lymphocyte 23.0 %   Monocytes Relative 8.6 %   Eosinophils Relative 0.9 %   Basophils Relative 0.7 %  Hemoglobin A1c  Result Value Ref Range   Hgb A1c MFr Bld 5.3 <5.7 % of total Hgb   Mean Plasma Glucose 105 mg/dL   eAG (mmol/L) 5.8 mmol/L  TSH  Result Value Ref Range   TSH 1.21 0.40 - 4.50 mIU/L      Assessment & Plan:   Problem List Items Addressed This Visit  Essential hypertension - Primary   Other Visit Diagnoses     Seasonal allergic rhinitis due to other allergic trigger       Relevant Medications   fluticasone  (FLONASE) 50 MCG/ACT nasal spray   Needs flu shot       Relevant Orders   Flu Vaccine QUAD 64moIM (Fluarix, Fluzone & Alfiuria Quad PF) (Completed)   Primary osteoarthritis of both knees       Relevant Medications   diclofenac (VOLTAREN) 75 MG EC tablet       Keep a watch on BP  Currently seems mostly controlled  Continue Olmesartan 239mdaily, you have an additional refill available for now.  For cough likely sinus drainage Start nasal steroid Flonase 2 sprays in each nostril daily for 4-6 weeks, may repeat course seasonally or as needed  Choose an anti histamine - either CLaritin / Zyrtec or Allegra - once daily, these are all non drowsy. I usually prefer the generic claritin Loratadine.  Refills added to Diclofenac voltaren pill for arthritis  Check with Dr KaNicolasa Duckingn Zoloft refills  Meds ordered this encounter  Medications   fluticasone (FLONASE) 50 MCG/ACT nasal spray    Sig: Place 2 sprays into both nostrils daily. Use for 4-6 weeks then stop and use seasonally or as needed.    Dispense:  48 mL    Refill:  3   diclofenac (VOLTAREN) 75 MG EC tablet    Sig: Take 1 tablet (75 mg total) by mouth 2 (two) times daily as needed for moderate pain or mild pain.    Dispense:  180 tablet    Refill:  3    Add refills for future      Follow up plan: Return in about 2 months (around 01/25/2023) for 2 month fasting lab only then return 1 week later for Annual Physical.  Future labs ordered for  CMET CBC Lipid A1c TSH T4 Vitamin D   AlNobie PutnamDO [facility] 11/26/2022, 9:53 AM

## 2022-11-26 NOTE — Patient Instructions (Addendum)
Thank you for coming to the office today.  Keep a watch on BP  Currently seems mostly controlled  Continue Olmesartan 20mg  daily, you have an additional refill available for now.  For cough likely sinus drainage Start nasal steroid Flonase 2 sprays in each nostril daily for 4-6 weeks, may repeat course seasonally or as needed  Choose an anti histamine - either CLaritin / Zyrtec or Allegra - once daily, these are all non drowsy. I usually prefer the generic claritin Loratadine.  Refills added to Diclofenac voltaren pill for arthritis  Check with Dr Nicolasa Ducking on Zoloft refills  DUE for FASTING BLOOD WORK (no food or drink after midnight before the lab appointment, only water or coffee without cream/sugar on the morning of)  SCHEDULE "Lab Only" visit in the morning at the clinic for lab draw in 2 MONTHS   - Make sure Lab Only appointment is at about 1 week before your next appointment, so that results will be available  For Lab Results, once available within 2-3 days of blood draw, you can can log in to MyChart online to view your results and a brief explanation. Also, we can discuss results at next follow-up visit.   Please schedule a Follow-up Appointment to: Return in about 2 months (around 01/25/2023) for 2 month fasting lab only then return 1 week later for Annual Physical.  If you have any other questions or concerns, please feel free to call the office or send a message through [facility]. You may also schedule an earlier appointment if necessary.  Additionally, you may be receiving a survey about your experience at our office within a few days to 1 week by e-mail or mail. We value your feedback.  Nobie Putnam, DO [place]

## 2022-11-27 ENCOUNTER — Other Ambulatory Visit: Payer: Self-pay | Admitting: Family Medicine

## 2022-11-27 DIAGNOSIS — E559 Vitamin D deficiency, unspecified: Secondary | ICD-10-CM

## 2022-11-27 DIAGNOSIS — R7309 Other abnormal glucose: Secondary | ICD-10-CM

## 2022-11-27 DIAGNOSIS — F411 Generalized anxiety disorder: Secondary | ICD-10-CM

## 2022-11-27 DIAGNOSIS — E663 Overweight: Secondary | ICD-10-CM

## 2022-11-27 DIAGNOSIS — Z0000 Encounter for general adult medical examination without abnormal findings: Secondary | ICD-10-CM

## 2022-11-27 DIAGNOSIS — I10 Essential (primary) hypertension: Secondary | ICD-10-CM

## 2022-11-27 DIAGNOSIS — E7800 Pure hypercholesterolemia, unspecified: Secondary | ICD-10-CM

## 2023-03-03 ENCOUNTER — Other Ambulatory Visit: Payer: Self-pay | Admitting: Family Medicine

## 2023-03-03 DIAGNOSIS — I10 Essential (primary) hypertension: Secondary | ICD-10-CM

## 2023-03-04 NOTE — Telephone Encounter (Signed)
Unable to refill per protocol, Rx request is too soon. Last refill 10/27/22 for 90 and 1 refill.  Requested Prescriptions  Pending Prescriptions Disp Refills   olmesartan (BENICAR) 20 MG tablet [Pharmacy Med Name: OLMESARTAN MEDOXOMIL 20 MG TAB] 90 tablet 1    Sig: TAKE 1 TABLET BY MOUTH EVERY DAY     Cardiovascular:  Angiotensin Receptor Blockers Failed - 03/03/2023  1:41 PM      Failed - Cr in normal range and within 180 days    Creat  Date Value Ref Range Status  01/13/2022 0.67 0.50 - 1.03 mg/dL Final         Failed - K in normal range and within 180 days    Potassium  Date Value Ref Range Status  01/13/2022 3.9 3.5 - 5.3 mmol/L Final  08/30/2012 3.6 3.5 - 5.1 mmol/L Final         Passed - Patient is not pregnant      Passed - Last BP in normal range    BP Readings from Last 1 Encounters:  11/26/22 118/70         Passed - Valid encounter within last 6 months    Recent Outpatient Visits           3 months ago Essential hypertension   [facility] Smitty Cords, DO   1 year ago Acute non-recurrent frontal sinusitis   [facility] Smitty Cords, DO   1 year ago Annual physical exam   [facility] Smitty Cords, DO   1 year ago TMJ (temporomandibular joint syndrome)   [facility] Smitty Cords, DO   2 years ago Acute non-recurrent frontal sinusitis    [facility], [place] Ne[place], [place]

## 2023-04-02 ENCOUNTER — Ambulatory Visit
Admission: EM | Admit: 2023-04-02 | Discharge: 2023-04-02 | Disposition: A | Discharge status: Home / Self Care | Payer: BC Managed Care – PPO

## 2023-04-02 DIAGNOSIS — J069 Acute upper respiratory infection, unspecified: Secondary | ICD-10-CM | POA: Diagnosis not present

## 2023-04-02 NOTE — ED Provider Notes (Signed)
Renaldo Fiddler    CSN: [id number] Arrival date & time: 04/02/23  0801      History   Chief Complaint Chief Complaint  Patient presents with   Fever    HPI [name] is a 62 y.o. female.    Fever   Patient presents to urgent care with complaint of "low-grade fever", myalgias, nasal congestion, "wet sounding" cough, but unproductive.  Symptoms x 2 days.  Patient reports concern with pneumonia as her mother was recently hospitalized with that disorder.  She has been using Coricidin to manage her cough.  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      Home Medications    Prior to Admission medications   Medication Sig Start Date End Date Taking? Authorizing Provider  Biotin 1000 MCG tablet Take by mouth. Patient not taking: Reported on 05/06/2020    [provider]  calcium-vitamin D (OSCAL WITH D) 500-200 MG-UNIT per tablet Take 1 tablet by mouth daily. For Vitamin D replacement and mood control. 08/25/12   Mike Craze, MD  diclofenac (VOLTAREN) 75 MG EC tablet Take 1 tablet (75 mg total) by mouth 2 (two) times daily as needed for moderate pain or mild pain. 11/26/22   Karamalegos, Netta Neat, DO  fluticasone (FLONASE) 50 MCG/ACT nasal spray Place 2 sprays into both nostrils daily. Use for 4-6 weeks  then stop and use seasonally or as needed. 11/26/22   Karamalegos, Netta Neat, DO  Multiple Vitamin (MULTIVITAMIN WITH MINERALS) TABS Take 1 tablet by mouth daily. For nutritional supplementation. 08/25/12   Mike Craze, MD  olmesartan (BENICAR) 20 MG tablet TAKE 1 TABLET BY MOUTH EVERY DAY 10/27/22   Althea Charon, Netta Neat, DO  sertraline (ZOLOFT) 50 MG tablet Take 50 mg by mouth daily. 01/09/22   [provider]    Family History Family History  Problem Relation Age of Onset   Arthritis Mother    Hypertension Mother    Hyperlipidemia Mother    Hypothyroidism Mother    Heart attack Father 44   Sarcoidosis Father        died of sarcoidosis    Bipolar disorder Brother    Anxiety disorder Brother    Depression Brother    Depression Maternal Aunt     Social History Social History   Tobacco Use   Smoking status: Never   Smokeless tobacco: Never  Vaping Use   Vaping Use: Never used  Substance Use Topics   Alcohol use: No    Comment: social    Drug use: No     Allergies   Sulfa antibiotics   Review of Systems Review of Systems  Constitutional:  Positive for fever.     Physical Exam Triage Vital Signs ED Triage Vitals  Enc Vitals Group  BP 04/02/23 0809 118/61     Pulse Rate 04/02/23 0809 67     Resp 04/02/23 0809 18     Temp 04/02/23 0809 98.3 F (36.8 C)     Temp src --      SpO2 04/02/23 0809 95 %     Weight --      Height --      Head Circumference --      Peak Flow --      Pain Score 04/02/23 0807 3     Pain Loc --      Pain Edu? --      Excl. in GC? --    No data found.  Updated Vital Signs BP 118/61   Pulse 67   Temp 98.3 F (36.8 C)   Resp 18   LMP 05/28/2013   SpO2 95%   Visual Acuity Right Eye Distance:   Left Eye Distance:   Bilateral Distance:    Right Eye Near:   Left Eye Near:    Bilateral Near:     Physical Exam Vitals reviewed.  Constitutional:      Appearance: Normal appearance. She is ill-appearing.   HENT:     Right Ear: Tympanic membrane normal.     Left Ear: Tympanic membrane normal.     Nose: Congestion present.     Mouth/Throat:     Pharynx: Posterior oropharyngeal erythema present. No oropharyngeal exudate.  Eyes:     Conjunctiva/sclera: Conjunctivae normal.     Pupils: Pupils are equal, round, and reactive to light.  Cardiovascular:     Rate and Rhythm: Normal rate and regular rhythm.     Pulses: Normal pulses.     Heart sounds: Normal heart sounds.  Pulmonary:     Effort: Pulmonary effort is normal.     Breath sounds: Normal breath sounds.  Lymphadenopathy:     Cervical: No cervical adenopathy.  Skin:    General: Skin is warm and dry.  Neurological:     General: No focal deficit present.     Mental Status: She is alert and oriented to person, place, and time.  Psychiatric:        Mood and Affect: Mood normal.        Behavior: Behavior normal.      UC Treatments / Results  Labs (all labs ordered are listed, but only abnormal results are displayed) Labs Reviewed - No data to display  EKG   Radiology No results found.  Procedures Procedures (including critical care time)  Medications Ordered in UC Medications - No data to display  Initial Impression / Assessment and Plan / UC Course  I have reviewed the triage vital signs and the nursing notes.  Pertinent labs & imaging results that were available during my care of the patient were reviewed by me and considered in my medical decision making (see chart for details).   [name] is a 62 y.o. female presenting with URI symptoms. Patient is afebrile without recent antipyretics, satting well on room air. Overall is well appearing and non-toxic, well hydrated, without respiratory distress. Pulmonary exam is unremarkable.  Lungs CTAB without wheezing, rhonchi, rales.  TMs are WNL bilaterally.  She is positive for nasal congestion with mild pharyngeal erythema.  No peritonsillar exudates.  No cervical  lymphadenopathy.  Patient's symptoms are consistent with an acute viral process.  Discussed antibiotic stewardship and that they would be ineffective to treat her virus.  She is using OTC cold/flu medication which  is adequately treating her symptoms.  Continue to recommend supportive care and symptom management with OTC medication.  Counseled patient on potential for adverse effects with medications prescribed/recommended today, ER and return-to-clinic precautions discussed, patient verbalized understanding and agreement with care plan.   Final Clinical Impressions(s) / UC Diagnoses   Final diagnoses:  None   Discharge Instructions   None    ED Prescriptions   None    PDMP not reviewed this encounter.   Charma Igo, [place] 04/02/23 [phone]

## 2023-04-02 NOTE — ED Triage Notes (Signed)
Patient to Urgent Care with complaints of  "low grade" fevers, generalized body aches, nasal congestion, wet sounding cough but unable to produce anything.  Symptoms started Wednesday. Reports her mother was recently hospitalized with pneumonia.   Has been taking coricidin.

## 2023-04-02 NOTE — Discharge Instructions (Addendum)
Follow up here or with your primary care provider if your symptoms are worsening or not improving.     

## 2023-04-05 ENCOUNTER — Encounter: Payer: Self-pay | Admitting: Family Medicine

## 2023-04-05 ENCOUNTER — Ambulatory Visit: Payer: BC Managed Care – PPO | Admitting: Family Medicine

## 2023-04-05 VITALS — BP 122/80 | HR 64 | Temp 96.8°F | Wt 167.0 lb

## 2023-04-05 DIAGNOSIS — R0981 Nasal congestion: Secondary | ICD-10-CM

## 2023-04-05 DIAGNOSIS — J039 Acute tonsillitis, unspecified: Secondary | ICD-10-CM | POA: Diagnosis not present

## 2023-04-05 DIAGNOSIS — J029 Acute pharyngitis, unspecified: Secondary | ICD-10-CM

## 2023-04-05 LAB — POCT RAPID STREP A (OFFICE): Rapid Strep A Screen: NEGATIVE

## 2023-04-05 MED ORDER — IPRATROPIUM BROMIDE 0.06 % NA SOLN: 2.0000 | Freq: Four times a day (QID) | NASAL | 0 refills | Status: DC

## 2023-04-05 MED ORDER — AMOXICILLIN-POT CLAVULANATE 875-125 MG PO TABS: 1.0000 | ORAL_TABLET | Freq: Two times a day (BID) | ORAL | 0 refills | Status: DC

## 2023-04-05 NOTE — Progress Notes (Signed)
Subjective:    Patient ID: [name], female    DOB: [birth year], 62 y.o.   MRN: [id number]  [name] is a 62 y.o. female presenting on 04/05/2023 for Sore Throat (Symptoms started 03/31/2023 ) and URI   HPI  URI / Sore Throat Reports initial onset symptoms 5/8 with sore throat and chills and fever aches. No sick contact She went to [facility] 04/02/23, no testing completed there, continued OTC medications Coricidin throat. She took Tylenol AS NEEDED with episodic relief from fever She did home test COVID negative x 2 Admits occaisonal cough at night Denies dyspnea       04/05/2023   10:32 AM 01/13/2022    2:03 PM 08/01/2021   11:03 AM  Depression screen PHQ 2/9  Decreased Interest 0 0 0  Down, Depressed, Hopeless 0 0 0  PHQ - 2 Score 0 0 0  Altered sleeping 0 0 0  Tired, decreased energy 0 0 0  Change in appetite 0 0 0  Feeling bad or failure about yourself  0 0 0  Trouble concentrating 0 0 0  Moving slowly or fidgety/restless 0 0 0  Suicidal thoughts 0 0 0  PHQ-9 Score 0 0 0  Difficult doing work/chores Not difficult at all Not difficult at all Not difficult at all    Social History   Tobacco Use   Smoking status: Never   Smokeless tobacco: Never  Vaping Use   Vaping Use: Never used  Substance Use Topics   Alcohol use: No    Comment: social    Drug use: No    Review of Systems Per HPI unless specifically indicated above     Objective:    BP 122/80 (BP Location: Left Arm, Patient Position: Sitting, Cuff Size: Normal)   Pulse 64   Temp (!) 96.8 F (36 C) (Temporal)   Wt 167 lb (75.8 kg)   LMP 05/28/2013   SpO2 96%   BMI 30.54 kg/m   Wt Readings from Last 3 Encounters:  04/05/23 167 lb (75.8 kg)  11/26/22 170 lb (77.1 kg)  02/06/22 163 lb (73.9 kg)    Physical Exam Vitals and nursing note reviewed.  Constitutional:      General: She is not in acute distress.    Appearance: Normal appearance. She is well-developed. She is not diaphoretic.      Comments: Well-appearing, comfortable, cooperative  HENT:     Head: Normocephalic and atraumatic.     Right Ear: Ear canal normal. No middle ear effusion. Tympanic membrane is not erythematous.     Left Ear: Tympanic membrane and ear canal normal.  No middle ear effusion. Tympanic membrane is not erythematous.     Nose: Congestion present.     Mouth/Throat:     Mouth: Mucous membranes are moist.     Pharynx: Oropharyngeal exudate and posterior oropharyngeal erythema present. No uvula swelling.  Eyes:     General:        Right eye: No discharge.        Left eye: No discharge.     Conjunctiva/sclera: Conjunctivae normal.  Neck:     Thyroid: No thyromegaly.  Cardiovascular:     Rate and Rhythm: Normal rate and regular rhythm.     Heart sounds: Normal heart sounds. No murmur heard. Pulmonary:     Effort: Pulmonary effort is normal. No respiratory distress.     Breath sounds: Normal breath sounds. No wheezing or rales.  Musculoskeletal:  General: Normal range of motion.     Cervical back: Normal range of motion and neck supple.  Lymphadenopathy:     Cervical: No cervical adenopathy.  Skin:    General: Skin is warm and dry.     Findings: No erythema or rash.  Neurological:     Mental Status: She is alert and oriented to person, place, and time.  Psychiatric:        Mood and Affect: Mood normal.        Behavior: Behavior normal.        Thought Content: Thought content normal.     Comments: Well groomed, good eye contact, normal speech and thoughts    Results for orders placed or performed in visit on 04/05/23  POCT rapid strep A  Result Value Ref Range   Rapid Strep A Screen Negative Negative      Assessment & Plan:   Problem List Items Addressed This Visit   None Visit Diagnoses     Tonsillitis    -  Primary   Relevant Medications   amoxicillin-clavulanate (AUGMENTIN) 875-125 MG tablet   Sore throat       Relevant Orders   POCT rapid strep A (Completed)    Sinus congestion       Relevant Medications   ipratropium (ATROVENT) 0.06 % nasal spray       Acute URI / Pharyngitis within 1 week No obvious sick contact Rapid strep negative Ears clear, lungs clear Negative COVID home test  Likely tonsillitis infection R side based on exam with purulence. No midline shift or other acute abnormality  Rx Augmentin TWICE A DAY x 10 days  Start Atrovent nasal spray decongestant 2 sprays in each nostril up to 4 times daily for 7 days  Supportive care OTC options as well  Follow up if unresolved.  Meds ordered this encounter  Medications   amoxicillin-clavulanate (AUGMENTIN) 875-125 MG tablet    Sig: Take 1 tablet by mouth 2 (two) times daily.    Dispense:  20 tablet    Refill:  0   ipratropium (ATROVENT) 0.06 % nasal spray    Sig: Place 2 sprays into both nostrils 4 (four) times daily. For up to 5-7 days then stop.    Dispense:  15 mL    Refill:  0      Follow up plan: Return if symptoms worsen or fail to improve.   Saralyn Pilar, DO [facility] 04/05/2023, 10:39 AM

## 2023-04-05 NOTE — Patient Instructions (Addendum)
Thank you for coming to the office today.  Likely tonsillitis infection R side  Strep was negative  We will use Augmentin antibiotic twice a day.  Start Atrovent nasal spray decongestant 2 sprays in each nostril up to 4 times daily for 7 days  DUE for FASTING BLOOD WORK (no food or drink after midnight before the lab appointment, only water or coffee without cream/sugar on the morning of)  SCHEDULE "Lab Only" visit in the morning at the clinic for lab draw in 2  WEEKS   - Make sure Lab Only appointment is at about 1 week before your next appointment, so that results will be available  For Lab Results, once available within 2-3 days of blood draw, you can can log in to MyChart online to view your results and a brief explanation. Also, we can discuss results at next follow-up visit.   Please schedule a Follow-up Appointment to: Return if symptoms worsen or fail to improve.  If you have any other questions or concerns, please feel free to call the office or send a message through MyChart. You may also schedule an earlier appointment if necessary.  Additionally, you may be receiving a survey about your experience at our office within a few days to 1 week by e-mail or mail. We value your feedback.  Saralyn Pilar, DO [facility], [place]

## 2023-04-20 ENCOUNTER — Other Ambulatory Visit: Payer: BC Managed Care – PPO

## 2023-04-20 DIAGNOSIS — E7800 Pure hypercholesterolemia, unspecified: Secondary | ICD-10-CM

## 2023-04-20 DIAGNOSIS — E559 Vitamin D deficiency, unspecified: Secondary | ICD-10-CM

## 2023-04-20 DIAGNOSIS — Z0000 Encounter for general adult medical examination without abnormal findings: Secondary | ICD-10-CM

## 2023-04-20 DIAGNOSIS — R7309 Other abnormal glucose: Secondary | ICD-10-CM

## 2023-04-20 DIAGNOSIS — I10 Essential (primary) hypertension: Secondary | ICD-10-CM

## 2023-04-20 LAB — CBC WITH DIFFERENTIAL/PLATELET
Basophils Absolute: 97 cells/uL (ref 0–200)
MCHC: 32.4 g/dL (ref 32.0–36.0)
MPV: 9.2 fL (ref 7.5–12.5)
Neutro Abs: 5921 cells/uL (ref 1500–7800)
Platelets: 302 10*3/uL (ref 140–400)
Total Lymphocyte: 16.9 %

## 2023-04-21 LAB — COMPLETE METABOLIC PANEL WITH GFR
AG Ratio: 1.7 (calc) (ref 1.0–2.5)
ALT: 17 U/L (ref 6–29)
AST: 18 U/L (ref 10–35)
Albumin: 4.2 g/dL (ref 3.6–5.1)
Alkaline phosphatase (APISO): 95 U/L (ref 37–153)
BUN: 14 mg/dL (ref 7–25)
CO2: 29 mmol/L (ref 20–32)
Calcium: 9.1 mg/dL (ref 8.6–10.4)
Chloride: 101 mmol/L (ref 98–110)
Creat: 0.74 mg/dL (ref 0.50–1.05)
Globulin: 2.5 g/dL (calc) (ref 1.9–3.7)
Glucose, Bld: 97 mg/dL (ref 65–99)
Potassium: 4.5 mmol/L (ref 3.5–5.3)
Sodium: 138 mmol/L (ref 135–146)
Total Bilirubin: 0.5 mg/dL (ref 0.2–1.2)
Total Protein: 6.7 g/dL (ref 6.1–8.1)
eGFR: 93 mL/min/{1.73_m2} (ref >=60)

## 2023-04-21 LAB — LIPID PANEL
Cholesterol: 189 mg/dL (ref <200)
HDL: 51 mg/dL (ref >=50)
LDL Cholesterol (Calc): 105 mg/dL (calc) — ABNORMAL HIGH
Non-HDL Cholesterol (Calc): 138 mg/dL (calc) — ABNORMAL HIGH (ref <130)
Total CHOL/HDL Ratio: 3.7 (calc) (ref <5.0)
Triglycerides: 213 mg/dL — ABNORMAL HIGH (ref <150)

## 2023-04-21 LAB — CBC WITH DIFFERENTIAL/PLATELET
Absolute Monocytes: 567 cells/uL (ref 200–950)
Basophils Relative: 1.2 %
Eosinophils Absolute: 146 cells/uL (ref 15–500)
Eosinophils Relative: 1.8 %
HCT: 38 % (ref 35.0–45.0)
Hemoglobin: 12.3 g/dL (ref 11.7–15.5)
Lymphs Abs: 1369 cells/uL (ref 850–3900)
MCH: 28.3 pg (ref 27.0–33.0)
MCV: 87.4 fL (ref 80.0–100.0)
Monocytes Relative: 7 %
Neutrophils Relative %: 73.1 %
RBC: 4.35 10*6/uL (ref 3.80–5.10)
RDW: 13.3 % (ref 11.0–15.0)
WBC: 8.1 10*3/uL (ref 3.8–10.8)

## 2023-04-21 LAB — HEMOGLOBIN A1C
Hgb A1c MFr Bld: 5.9 % of total Hgb — ABNORMAL HIGH (ref <5.7)
Mean Plasma Glucose: 123 mg/dL
eAG (mmol/L): 6.8 mmol/L

## 2023-04-21 LAB — T4, FREE: Free T4: 1 ng/dL (ref 0.8–1.8)

## 2023-04-21 LAB — TSH: TSH: 0.88 mIU/L (ref 0.40–4.50)

## 2023-04-21 LAB — VITAMIN D 25 HYDROXY (VIT D DEFICIENCY, FRACTURES): Vit D, 25-Hydroxy: 42 ng/mL (ref 30–100)

## 2023-04-28 ENCOUNTER — Ambulatory Visit (INDEPENDENT_AMBULATORY_CARE_PROVIDER_SITE_OTHER): Payer: BC Managed Care – PPO | Admitting: Family Medicine

## 2023-04-28 ENCOUNTER — Encounter: Payer: Self-pay | Admitting: Family Medicine

## 2023-04-28 VITALS — BP 104/68 | HR 68 | Temp 98.8°F | Ht 62.0 in | Wt 168.4 lb

## 2023-04-28 DIAGNOSIS — F3342 Major depressive disorder, recurrent, in full remission: Secondary | ICD-10-CM

## 2023-04-28 DIAGNOSIS — Z0000 Encounter for general adult medical examination without abnormal findings: Secondary | ICD-10-CM

## 2023-04-28 DIAGNOSIS — E669 Obesity, unspecified: Secondary | ICD-10-CM | POA: Diagnosis not present

## 2023-04-28 DIAGNOSIS — E7800 Pure hypercholesterolemia, unspecified: Secondary | ICD-10-CM

## 2023-04-28 DIAGNOSIS — I10 Essential (primary) hypertension: Secondary | ICD-10-CM

## 2023-04-28 MED ORDER — OLMESARTAN MEDOXOMIL 20 MG PO TABS: 20.0000 mg | ORAL_TABLET | Freq: Every day | ORAL | 3 refills | Status: DC

## 2023-04-28 NOTE — Progress Notes (Signed)
Subjective:    Patient ID: [name], female    DOB: February 10, 1961, 62 y.o.   MRN: [id number]  [name] is a 62 y.o. female presenting on 04/28/2023 for Annual Exam   HPI  Here for Annual Physical and Lab Review  Elevated A1c New concern. Previous A1c normal. Last lab result A1c up to 5.9, prior range normal 5.3. No history of PreDM Admits drinking too much sweet tea and soda Goal to improve exercise  Insomnia / Anxiety / Stress Major Depression, chronic recurrent - currently in remission Prior history on chart possible Bipolar, mood disorder Followed by Dr Maryruth Bun Psychiatry + Therapist every other week Still on Sertraline (Zoloft) 50mg  daily No longer taking Trazodone Not taking BDZ either Denies depression, racing thoughts mood swing, agitation, pain breathing problem or other factor for sleep   CHRONIC HTN: last update 02/2022 she had ACEi cough likely on Lisinopril and we switched to ARB Doing well on Olmesartan 20mg  daily, needs re order Also admits 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 Tolerating well, w/o complaints. Lifestyle: - Diet: balanced, unchanged. Low sodium. No caffeine - Exercise: Limited due to time. Denies CP, dyspnea, HA, edema, dizziness / lightheadedness    Arthritis Voltaren, AS NEEDED use re ordered refills not taking every day   Health Maintenance:  Last pap and Mammogram 2022, request copies. She may prefer local GYN in future  GYN Arlie Solomons NP  [facility] [address] [place], [place] [address] [phone]      04/28/2023    8:07 AM 04/05/2023   10:32 AM 01/13/2022    2:03 PM  Depression screen PHQ 2/9  Decreased Interest 0 0 0  Down, Depressed, Hopeless 0 0 0  PHQ - 2 Score 0 0 0  Altered sleeping 0 0 0  Tired, decreased energy 0 0 0  Change in appetite 0 0 0  Feeling bad or  failure about yourself  0 0 0  Trouble concentrating 0 0 0  Moving slowly or fidgety/restless 0 0 0  Suicidal thoughts 0 0 0  PHQ-9 Score 0 0 0  Difficult doing work/chores Not difficult at all Not difficult at all Not difficult at all      04/28/2023    8:07 AM 04/05/2023   10:32 AM 01/13/2022    2:03 PM 08/01/2021   11:03 AM  GAD 7 : Generalized Anxiety Score  Nervous, Anxious, on Edge 1 1 0 0  Control/stop worrying 0 0 0 0  Worry too much - different things 0 0 0 0  Trouble relaxing 0 1 0 0  Restless 1 0 0 0  Easily annoyed or irritable 0 1 0 0  Afraid - awful might happen 0 0 0 0  Total GAD 7 Score 2 3 0 0  Anxiety Difficulty Not difficult at all  Not difficult at all Not difficult at all      Social History   Tobacco Use   Smoking status: Never   Smokeless tobacco: Never  Vaping Use   Vaping Use: Never used  Substance Use Topics   Alcohol use: No    Comment: social    Drug use: No    Review of Systems  Constitutional:  Negative for activity change, appetite change, chills, diaphoresis, fatigue and fever.  HENT:  Negative for congestion and hearing loss.   Eyes:  Negative for visual disturbance.  Respiratory:  Negative for cough, chest tightness, shortness of breath and wheezing.   Cardiovascular:  Negative for chest pain, palpitations and leg swelling.  Gastrointestinal:  Negative for abdominal pain, constipation, diarrhea, nausea and vomiting.  Genitourinary:  Negative for dysuria, frequency and hematuria.  Musculoskeletal:  Negative for arthralgias and neck pain.  Skin:  Negative for rash.  Neurological:  Negative for dizziness, weakness, light-headedness, numbness and headaches.  Hematological:  Negative for adenopathy.  Psychiatric/Behavioral:  Negative for behavioral problems, dysphoric mood and sleep disturbance.    Per HPI unless specifically indicated above     Objective:    BP 104/68 (BP Location: Left Arm)   Pulse 68   Temp 98.8 F (37.1 C)  (Oral)   Ht 5\' 2"  (1.575 m)   Wt 168 lb 6.4 oz (76.4 kg)   LMP 05/28/2013   SpO2 95%   BMI 30.80 kg/m   Wt Readings from Last 3 Encounters:  04/28/23 168 lb 6.4 oz (76.4 kg)  04/05/23 167 lb (75.8 kg)  11/26/22 170 lb (77.1 kg)    Physical Exam Vitals and nursing note reviewed.  Constitutional:      General: She is not in acute distress.    Appearance: She is well-developed. She is not diaphoretic.     Comments: Well-appearing, comfortable, cooperative  HENT:     Head: Normocephalic and atraumatic.  Eyes:     General:        Right eye: No discharge.        Left eye: No discharge.     Conjunctiva/sclera: Conjunctivae normal.     Pupils: Pupils are equal, round, and reactive to light.  Neck:     Thyroid: No thyromegaly.     Vascular: No carotid bruit.  Cardiovascular:     Rate and Rhythm: Normal rate and regular rhythm.     Pulses: Normal pulses.     Heart sounds: Normal heart sounds. No murmur heard. Pulmonary:     Effort: Pulmonary effort is normal. No respiratory distress.     Breath sounds: Normal breath sounds. No wheezing or rales.  Abdominal:     General: Bowel sounds are normal. There is no distension.     Palpations: Abdomen is soft. There is no mass.     Tenderness: There is no abdominal tenderness.  Musculoskeletal:        General: No tenderness. Normal range of motion.     Cervical back: Normal range of motion and neck supple.     Right lower leg: No edema.     Left lower leg: No edema.     Comments: Upper / Lower Extremities: - Normal muscle tone, strength bilateral upper extremities 5/5, lower extremities 5/5  Lymphadenopathy:     Cervical: No cervical adenopathy.  Skin:    General: Skin is warm and dry.     Findings: No erythema or rash.  Neurological:     Mental Status: She is alert and oriented to person, place, and time.     Comments: Distal sensation intact to light touch all extremities  Psychiatric:        Mood and Affect: Mood normal.         Behavior: Behavior normal.        Thought Content: Thought content normal.     Comments: Well groomed, good eye contact, normal speech and thoughts       Results for orders placed or performed in visit on 04/20/23  Hemoglobin  A1c  Result Value Ref Range   Hgb A1c MFr Bld 5.9 (H) <5.7 % of total Hgb   Mean Plasma Glucose 123 mg/dL   eAG (mmol/L) 6.8 mmol/L  Lipid panel  Result Value Ref Range   Cholesterol 189 <200 mg/dL   HDL 51 > OR = 50 mg/dL   Triglycerides 098 (H) <150 mg/dL   LDL Cholesterol (Calc) 105 (H) mg/dL (calc)   Total CHOL/HDL Ratio 3.7 <5.0 (calc)   Non-HDL Cholesterol (Calc) 138 (H) <130 mg/dL (calc)  CBC with Differential/Platelet  Result Value Ref Range   WBC 8.1 3.8 - 10.8 Thousand/uL   RBC 4.35 3.80 - 5.10 Million/uL   Hemoglobin 12.3 11.7 - 15.5 g/dL   HCT 11.9 14.7 - 82.9 %   MCV 87.4 80.0 - 100.0 fL   MCH 28.3 27.0 - 33.0 pg   MCHC 32.4 32.0 - 36.0 g/dL   RDW 56.2 13.0 - 86.5 %   Platelets 302 140 - 400 Thousand/uL   MPV 9.2 7.5 - 12.5 fL   Neutro Abs 5,921 1,500 - 7,800 cells/uL   Lymphs Abs 1,369 850 - 3,900 cells/uL   Absolute Monocytes 567 200 - 950 cells/uL   Eosinophils Absolute 146 15 - 500 cells/uL   Basophils Absolute 97 0 - 200 cells/uL   Neutrophils Relative % 73.1 %   Total Lymphocyte 16.9 %   Monocytes Relative 7.0 %   Eosinophils Relative 1.8 %   Basophils Relative 1.2 %  COMPLETE METABOLIC PANEL WITH GFR  Result Value Ref Range   Glucose, Bld 97 65 - 99 mg/dL   BUN 14 7 - 25 mg/dL   Creat 7.84 6.96 - 2.95 mg/dL   eGFR 93 > OR = 60 MW/UXL/2.44W1   BUN/Creatinine Ratio SEE NOTE: 6 - 22 (calc)   Sodium 138 135 - 146 mmol/L   Potassium 4.5 3.5 - 5.3 mmol/L   Chloride 101 98 - 110 mmol/L   CO2 29 20 - 32 mmol/L   Calcium 9.1 8.6 - 10.4 mg/dL   Total Protein 6.7 6.1 - 8.1 g/dL   Albumin 4.2 3.6 - 5.1 g/dL   Globulin 2.5 1.9 - 3.7 g/dL (calc)   AG Ratio 1.7 1.0 - 2.5 (calc)   Total Bilirubin 0.5 0.2 - 1.2 mg/dL   Alkaline  phosphatase (APISO) 95 37 - 153 U/L   AST 18 10 - 35 U/L   ALT 17 6 - 29 U/L  VITAMIN D 25 Hydroxy (Vit-D Deficiency, Fractures)  Result Value Ref Range   Vit D, 25-Hydroxy 42 30 - 100 ng/mL  T4, free  Result Value Ref Range   Free T4 1.0 0.8 - 1.8 ng/dL  TSH  Result Value Ref Range   TSH 0.88 0.40 - 4.50 mIU/L      Assessment & Plan:   Problem List Items Addressed This Visit     Essential hypertension    Well-controlled HTN ACEi cough, Off Lisinopril - Home BP readings was elevated, now improving    Plan:  Continue Olmesartan 20mg  daily Encourage improved lifestyle - low sodium diet, regular exercise Continue monitor BP outside office, bring readings to next visit, if persistently >140/90 or new symptoms notify office sooner      Relevant Medications   olmesartan (BENICAR) 20 MG tablet   Hyperlipidemia    Controlled cholesterol on lifestyle The 10-year ASCVD risk score (Arnett DK, et al., 2019) is: 3%  Plan: No statin therapy required Encourage improved lifestyle - low carb/cholesterol, reduce  portion size, continue improving regular exercise Follow-up yearly      Relevant Medications   olmesartan (BENICAR) 20 MG tablet   Major depression, recurrent, full remission (HCC)    Currently depression is in remission, seems chronic recurrent problem previous Followed by Dr Maryruth Bun Psychiatry + Therapist OFF Venlafaxine On Sertraline 50mg       Obesity (BMI 30.0-34.9)   Other Visit Diagnoses     Annual physical exam    -  Primary       Updated Health Maintenance information Reviewed recent lab results with patient Encouraged improvement to lifestyle with diet and exercise Goal of weight loss  Request copy of last Pap Smear + Mammogram from [facility] - Arlie Solomons NP, last record I see is from 2022). She will contact to schedule updated visit for screening or if prefer we can refer locally to [facility]  No orders of the defined types  were placed in this encounter.    Meds ordered this encounter  Medications   olmesartan (BENICAR) 20 MG tablet    Sig: Take 1 tablet (20 mg total) by mouth daily.    Dispense:  90 tablet    Refill:  3    I10     Follow up plan: Return in about 6 months (around 10/28/2023) for 6 month follow-up PreDM A1c, HTN, Insomnia updates.   Saralyn Pilar, DO [facility] 04/28/2023, 8:13 AM

## 2023-04-28 NOTE — Assessment & Plan Note (Signed)
Controlled cholesterol on lifestyle The 10-year ASCVD risk score (Arnett DK, et al., 2019) is: 3%  Plan: No statin therapy required Encourage improved lifestyle - low carb/cholesterol, reduce portion size, continue improving regular exercise Follow-up yearly

## 2023-04-28 NOTE — Assessment & Plan Note (Signed)
Currently depression is in remission, seems chronic recurrent problem previous Followed by Dr Maryruth Bun Psychiatry + Therapist OFF Venlafaxine On Sertraline 50mg 

## 2023-04-28 NOTE — Patient Instructions (Addendum)
Thank you for coming to the office today.  Recent Labs    04/20/23 0809  HGBA1C 5.9*   Goal to keep limiting carb starch sugars - emphasis on the drinks.  Triglyceride elevated due to the sugar. LDL cholesterol is good. You do not need cholesterol medication  All other lab results are within range.  ---------- Due for Mammogram + Pap Smear  Check in with previous GYN or a new location  Arlie Solomons NP  [facility] [address] [place], [place] [address] [phone]  [facility] [address] [place],  [place]  [address] Main: [phone] Fax: [phone]  ----------------  Colon Cancer Screening: - For all adults age 74+ routine colon cancer screening is highly recommended.     - Recent guidelines from American Cancer Society recommend starting age of 51 - Early detection of colon cancer is important, because often there are no warning signs or symptoms, also if found early usually it can be cured. Late stage is hard to treat.  - If you are not interested in Colonoscopy screening (if done and normal you could be cleared for 5 to 10 years until next due), then Cologuard is an excellent alternative for screening test for Colon Cancer. It is highly sensitive for detecting DNA of colon cancer from even the earliest stages. Also, there is NO bowel prep required. - If Cologuard is NEGATIVE, then it is good for 3 years before next due - If Cologuard is POSITIVE, then it is strongly advised to get a Colonoscopy, which allows the GI doctor to locate the source of the cancer or polyp (even very early stage) and treat it by removing it. ------------------------- If you would like to proceed with Cologuard (stool DNA test) - FIRST, call your insurance company and tell them you want to check cost of Cologuard tell them CPT Code 40347 (it may be completely covered and you could get for no cost, OR max cost  without any coverage is about $600). Also, keep in mind if you do NOT open the kit, and decide not to do the test, you will NOT be charged, you should contact the company if you decide not to do the test. - If you want to proceed, you can notify us (phone message, MyChart Message, or at next visit) and we will order it for you. The test kit will be delivered to you house within about 1 week. Follow instructions to collect sample, you may call the company for any help or questions, 24/7 telephone support at [phone].   Please schedule a Follow-up Appointment to: Return in about 6 months (around 10/28/2023) for 6 month follow-up PreDM A1c, HTN, Insomnia updates.  If you have any other questions or concerns, please feel free to call the office or send a message through MyChart. You may also schedule an earlier appointment if necessary.  Additionally, you may be receiving a survey about your experience at our office within a few days to 1 week by e-mail or mail. We value your feedback.  Saralyn Pilar, DO [facility], [place]

## 2023-04-28 NOTE — Assessment & Plan Note (Signed)
Well-controlled HTN ACEi cough, Off Lisinopril - Home BP readings was elevated, now improving    Plan:  Continue Olmesartan 20mg  daily Encourage improved lifestyle - low sodium diet, regular exercise Continue monitor BP outside office, bring readings to next visit, if persistently >140/90 or new symptoms notify office sooner

## 2023-05-04 ENCOUNTER — Ambulatory Visit: Payer: Self-pay

## 2023-05-04 ENCOUNTER — Encounter: Payer: Self-pay | Admitting: Family Medicine

## 2023-05-04 ENCOUNTER — Telehealth (INDEPENDENT_AMBULATORY_CARE_PROVIDER_SITE_OTHER): Payer: BC Managed Care – PPO | Admitting: Family Medicine

## 2023-05-04 DIAGNOSIS — L237 Allergic contact dermatitis due to plants, except food: Secondary | ICD-10-CM | POA: Diagnosis not present

## 2023-05-04 MED ORDER — TRIAMCINOLONE ACETONIDE 0.5 % EX CREA
1.0000 | TOPICAL_CREAM | Freq: Two times a day (BID) | CUTANEOUS | 0 refills | Status: AC
Start: 2023-05-04 — End: ?

## 2023-05-04 MED ORDER — PREDNISONE 20 MG PO TABS
ORAL_TABLET | ORAL | 0 refills | Status: DC
Start: 2023-05-04 — End: 2023-06-17

## 2023-05-04 NOTE — Patient Instructions (Addendum)
Thank you for coming to the office today.  It looks like you do have Poison Ivy / Oak Recommend to use topical treatments with Calamine, (may try oatmeal bath), and may take Benadryl at night as needed for itching and sleep.  Start Prednisone taper  Hold Diclofenac while on prednisone  Topical steroid rx  If symptoms get worse or do not resolve at all within 2 weeks, please return for re-evaluation. Otherwise, if improved but start to come back, you may call for refill on Prednisone - if develop swelling, redness, fever, and warmth, drainage of pus concern for infection please return sooner. - Try your best to avoid scratching (reduce chances of infection)  In the future, to help prevent poison ivy skin rash it is recommended to thoroughly wash potentially exposed areas with soap or even liquid dish soap (if it is hand safe) within 30 minutes of initial exposure. You can reduce risk of flare up if you wash within 1 to 3 hours, but if you wait more than 3 hours, high likelihood of developing the rash.   Please schedule a Follow-up Appointment to: Return if symptoms worsen or fail to improve.  If you have any other questions or concerns, please feel free to call the office or send a message through MyChart. You may also schedule an earlier appointment if necessary.  Additionally, you may be receiving a survey about your experience at our office within a few days to 1 week by e-mail or mail. We value your feedback.  Saralyn Pilar, DO [facility], [place]

## 2023-05-04 NOTE — Progress Notes (Signed)
Subjective:    Patient ID: [name], female    DOB: 1961-07-02, 62 y.o.   MRN: [id number]  [name] is a 62 y.o. female presenting on 05/04/2023 for Rash (Complains of  a rash on her Face, cheeks, neck and chin x 1 day. She reports working out in the yard and possible coming into contact with poison ivy.)  Multimedia programmer / Telehealth Encounter - Video Visit via MyChart The purpose of this virtual visit is to provide medical care while limiting exposure to the novel coronavirus (COVID19) for both patient and office staff.  Consent was obtained for remote visit:  Yes.   Answered questions that patient had about telehealth interaction:  Yes.   I discussed the limitations, risks, security and privacy concerns of performing an evaluation and management service by video/telephone. I also discussed with the patient that there may be a patient responsible charge related to this service. The patient expressed understanding and agreed to proceed.  Patient Location: Home Provider Location: [facility] (Office)  Participants in virtual visit: - Patient: [name] - CMA: Laurel Dimmer CMA - Provider: Dr Althea Charon   HPI  Poison Ivy Dermaitis  Reports new onset poison ivy rash with dermatitis on face cheek neck chin, likely outdoor exposure trigger 1 day ago. History of prior poison ivy reaction. Not on medication at this time. Denies fever chills or infection     04/28/2023    8:07 AM 04/05/2023   10:32 AM 01/13/2022    2:03 PM  Depression screen PHQ 2/9  Decreased Interest 0 0 0  Down, Depressed, Hopeless 0 0 0  PHQ - 2 Score 0 0 0  Altered sleeping 0 0 0  Tired, decreased energy 0 0 0  Change in appetite 0 0 0  Feeling bad or failure about yourself  0 0 0  Trouble concentrating 0 0 0  Moving slowly or fidgety/restless 0 0 0  Suicidal thoughts 0 0 0  PHQ-9 Score 0 0 0  Difficult doing work/chores Not difficult at all Not difficult at all Not difficult at all     Social History   Tobacco Use   Smoking status: Never   Smokeless tobacco: Never  Vaping Use   Vaping Use: Never used  Substance Use Topics   Alcohol use: No    Comment: social    Drug use: No    Review of Systems Per HPI unless specifically indicated above     Objective:    LMP 05/28/2013   Wt Readings from Last 3 Encounters:  04/28/23 168 lb 6.4 oz (76.4 kg)  04/05/23 167 lb (75.8 kg)  11/26/22 170 lb (77.1 kg)    Physical Exam  Note examination was completely remotely via video observation objective data only however unable to get her camera to work, so no visual seen on call  Gen - well-appearing, no acute distress or apparent pain, comfortable HEENT - eyes appear clear without discharge or redness Heart/Lungs - cannot examine virtually - observed no evidence of coughing or labored breathing. Abd - cannot examine virtually  Skin - unable to view - difficulty with her camera Neuro - awake, alert, oriented Psych - not anxious appearing   Results for orders placed or performed in visit on 04/20/23  Hemoglobin A1c  Result Value Ref Range   Hgb A1c MFr Bld 5.9 (H) <5.7 % of total Hgb   Mean Plasma Glucose 123 mg/dL   eAG (mmol/L) 6.8 mmol/L  Lipid panel  Result Value Ref Range   Cholesterol 189 <200 mg/dL   HDL 51 > OR = 50 mg/dL   Triglycerides 161 (H) <150 mg/dL   LDL Cholesterol (Calc) 105 (H) mg/dL (calc)   Total CHOL/HDL Ratio 3.7 <5.0 (calc)   Non-HDL Cholesterol (Calc) 138 (H) <130 mg/dL (calc)  CBC with Differential/Platelet  Result Value Ref Range   WBC 8.1 3.8 - 10.8 Thousand/uL   RBC 4.35 3.80 - 5.10 Million/uL   Hemoglobin 12.3 11.7 - 15.5 g/dL   HCT 09.6 04.5 - 40.9 %   MCV 87.4 80.0 - 100.0 fL   MCH 28.3 27.0 - 33.0 pg   MCHC 32.4 32.0 - 36.0 g/dL   RDW 81.1 91.4 - 78.2 %   Platelets 302 140 - 400 Thousand/uL   MPV 9.2 7.5 - 12.5 fL   Neutro Abs 5,921 1,500 - 7,800 cells/uL   Lymphs Abs 1,369 850 - 3,900 cells/uL   Absolute Monocytes  567 200 - 950 cells/uL   Eosinophils Absolute 146 15 - 500 cells/uL   Basophils Absolute 97 0 - 200 cells/uL   Neutrophils Relative % 73.1 %   Total Lymphocyte 16.9 %   Monocytes Relative 7.0 %   Eosinophils Relative 1.8 %   Basophils Relative 1.2 %  COMPLETE METABOLIC PANEL WITH GFR  Result Value Ref Range   Glucose, Bld 97 65 - 99 mg/dL   BUN 14 7 - 25 mg/dL   Creat 9.56 2.13 - 0.86 mg/dL   eGFR 93 > OR = 60 VH/QIO/9.62X5   BUN/Creatinine Ratio SEE NOTE: 6 - 22 (calc)   Sodium 138 135 - 146 mmol/L   Potassium 4.5 3.5 - 5.3 mmol/L   Chloride 101 98 - 110 mmol/L   CO2 29 20 - 32 mmol/L   Calcium 9.1 8.6 - 10.4 mg/dL   Total Protein 6.7 6.1 - 8.1 g/dL   Albumin 4.2 3.6 - 5.1 g/dL   Globulin 2.5 1.9 - 3.7 g/dL (calc)   AG Ratio 1.7 1.0 - 2.5 (calc)   Total Bilirubin 0.5 0.2 - 1.2 mg/dL   Alkaline phosphatase (APISO) 95 37 - 153 U/L   AST 18 10 - 35 U/L   ALT 17 6 - 29 U/L  VITAMIN D 25 Hydroxy (Vit-D Deficiency, Fractures)  Result Value Ref Range   Vit D, 25-Hydroxy 42 30 - 100 ng/mL  T4, free  Result Value Ref Range   Free T4 1.0 0.8 - 1.8 ng/dL  TSH  Result Value Ref Range   TSH 0.88 0.40 - 4.50 mIU/L      Assessment & Plan:   Problem List Items Addressed This Visit   None Visit Diagnoses     Poison ivy dermatitis    -  Primary   Relevant Medications   predniSONE (DELTASONE) 20 MG tablet   triamcinolone cream (KENALOG) 0.5 %       Consistent with poison ivy contact dermatitis, x 1 day duration.  Patient with known h/o poison ivy, similar with previous exposures - Afebrile, no evidence of bacterial superinfection from scratching  Plan: Prednisone taper, hold oral NSAID Diclofenac while on this Rx topical Triamcinolone steroid Okay to use Benadryl or other alternative Follow up if not improving. Or new complications   Meds ordered this encounter  Medications   predniSONE (DELTASONE) 20 MG tablet    Sig: Take daily with food. Start with 60mg  (3 pills) x  2 days, then reduce to 40mg  (2 pills) x 2  days, then 20mg  (1 pill) x 3 days    Dispense:  13 tablet    Refill:  0   triamcinolone cream (KENALOG) 0.5 %    Sig: Apply 1 Application topically 2 (two) times daily. To affected areas, for up to 2 weeks.    Dispense:  30 g    Refill:  0      Follow up plan: Return if symptoms worsen or fail to improve.   Patient verbalizes understanding with the above medical recommendations including the limitation of remote medical advice.  Specific follow-up and call-back criteria were given for patient to follow-up or seek medical care more urgently if needed.  Total duration of direct patient care provided via video conference: 10 minutes   Saralyn Pilar, DO [facility] 05/04/2023, 11:28 AM

## 2023-05-04 NOTE — Telephone Encounter (Signed)
  Chief Complaint: rash from poison ivy Symptoms: red bumps and rash to forehead down to face, neck and chest, forearms, ear   Frequency: yesterday, itching and swelling to face  Pertinent Negatives: Patient denies difficulty breathing, swollen tongue, difficulty swallowing Disposition: [] ED /[] Urgent Care (no appt availability in office) / [x] Appointment(In office/virtual)/ []  [facility]/ [] Home Care/ [] Refused Recommended Disposition /[] [facility]/ []  Follow-up with PCP Additional Notes: pt requested VV- advised pt to upload pics of rash.  Reason for Disposition  [1] Face or genitals are involved AND [2] more than a small rash  Answer Assessment - Initial Assessment Questions 1. APPEARANCE of RASH: "Describe the rash."      Red, bumps.  2. LOCATION: "Where is the rash located?"  (e.g., face, genitals, hands, legs)     Face red hot swollen - forearms, chest, neck, ear  3. SIZE: "How large is the rash?"      Left side of face to front chest  4. ONSET: "When did the rash begin?"      Yesterday  5. ITCHING: "Does the rash itch?" If Yes, ask: "How bad is it?"   - MILD - doesn't interfere with normal activities   - MODERATE-SEVERE: interferes with work, school, sleep, or other activities      mild 6. EXPOSURE:  "How were you exposed to the plant (poison ivy, poison oak, sumac)"  "When were you exposed?"       Poison ivy  7. PAST HISTORY: "Have you had a poison ivy rash before?" If Yes, ask: "How bad was it?"     Yes/ less of a reaction  8. PREGNANCY: "Is there any chance you are pregnant?" "When was your last menstrual period?"     N/a  Protocols used: Poison Ivy - Oak - [facility]

## 2023-06-17 ENCOUNTER — Telehealth (INDEPENDENT_AMBULATORY_CARE_PROVIDER_SITE_OTHER): Payer: BC Managed Care – PPO | Admitting: Family Medicine

## 2023-06-17 ENCOUNTER — Ambulatory Visit: Payer: Self-pay

## 2023-06-17 ENCOUNTER — Encounter: Payer: Self-pay | Admitting: Family Medicine

## 2023-06-17 DIAGNOSIS — U071 COVID-19: Secondary | ICD-10-CM | POA: Diagnosis not present

## 2023-06-17 MED ORDER — NIRMATRELVIR/RITONAVIR (PAXLOVID)TABLET
3.0000 | ORAL_TABLET | Freq: Two times a day (BID) | ORAL | 0 refills | Status: AC
Start: 2023-06-17 — End: 2023-06-22

## 2023-06-17 NOTE — Telephone Encounter (Signed)
I am willing to help with add on double book MyChart Video Visit for COVID Positive either end of morning session or end of afternoon session.  Alternatively, she can do the [facility] / connect with a provider if need and they can do it as well if this time does not work for her.  Thanks  Saralyn Pilar, DO [facility] 06/17/2023, 10:22 AM

## 2023-06-17 NOTE — Patient Instructions (Addendum)
Thank you for coming to the office today.  COVID Positive  Rx Paxlovid, let us know if any issues getting it filled, take within 5 days of onset, only if severe symptoms or worsening, cough shortness of breath fever. Otherwise can skip.  Keep on OTC meds.  Follow up if not improving.  Please schedule a Follow-up Appointment to: Return if symptoms worsen or fail to improve.  If you have any other questions or concerns, please feel free to call the office or send a message through MyChart. You may also schedule an earlier appointment if necessary.  Additionally, you may be receiving a survey about your experience at our office within a few days to 1 week by e-mail or mail. We value your feedback.  Saralyn Pilar, DO [facility], [place]

## 2023-06-17 NOTE — Telephone Encounter (Signed)
Patient aware and appointment scheduled.  

## 2023-06-17 NOTE — Progress Notes (Signed)
Subjective:    Patient ID: [name], female    DOB: Oct 29, 1961, 62 y.o.   MRN: [id number]  [name] is a 62 y.o. female presenting on 06/17/2023 for Covid Positive  Virtual / Telehealth Encounter - Video Visit via MyChart The purpose of this virtual visit is to provide medical care while limiting exposure to the novel coronavirus (COVID19) for both patient and office staff.  Consent was obtained for remote visit:  Yes.   Answered questions that patient had about telehealth interaction:  Yes.   I discussed the limitations, risks, security and privacy concerns of performing an evaluation and management service by video/telephone. I also discussed with the patient that there may be a patient responsible charge related to this service. The patient expressed understanding and agreed to proceed.  Patient Location: Home Provider Location: [facility] (Office)  Participants in virtual visit: - Patient: [name] - CMA: Darrol Angel CMA - Provider: Dr Althea Charon   HPI  COVID-19 Positive Reports symptoms onset yesterday with headache sinus congestion coughing. She tested positive COVID19 today Today some improvement already and is on OTC meds Coricidin and Tylenol She lives with mother who is age 62 and trying to quarantine Son diagnosed with COVID-19 No prior covid infection Denies dyspnea, productive cough fever chills aches  Missed work yesterday and today.  Chronic irritating cough Not using nasal spray regularly She already switched ACEi to ARB Olmesartan      04/28/2023    8:07 AM 04/05/2023   10:32 AM 01/13/2022    2:03 PM  Depression screen PHQ 2/9  Decreased Interest 0 0 0  Down, Depressed, Hopeless 0 0 0  PHQ - 2 Score 0 0 0  Altered sleeping 0 0 0  Tired, decreased energy 0 0 0  Change in appetite 0 0 0  Feeling bad or failure about yourself  0 0 0  Trouble concentrating 0 0 0  Moving slowly or fidgety/restless 0 0 0  Suicidal  thoughts 0 0 0  PHQ-9 Score 0 0 0  Difficult doing work/chores Not difficult at all Not difficult at all Not difficult at all    Social History   Tobacco Use   Smoking status: Never   Smokeless tobacco: Never  Vaping Use   Vaping status: Never Used  Substance Use Topics   Alcohol use: No    Comment: social    Drug use: No    Review of Systems Per HPI unless specifically indicated above     Objective:    LMP 05/28/2013   Wt Readings from Last 3 Encounters:  04/28/23 168 lb 6.4 oz (76.4 kg)  04/05/23 167 lb (75.8 kg)  11/26/22 170 lb (77.1 kg)    Physical Exam  Note examination was completely remotely via video observation objective data only  Gen - well-appearing, no acute distress or apparent pain, comfortable HEENT - eyes appear clear without discharge or redness Heart/Lungs - cannot examine virtually - observed no evidence of coughing or labored breathing. Abd - cannot examine virtually  Skin - face visible today- no rash Neuro - awake, alert, oriented Psych - not anxious appearing    Results for orders placed or performed in visit on 04/20/23  Hemoglobin A1c  Result Value Ref Range   Hgb A1c MFr Bld 5.9 (H) <5.7 % of total Hgb   Mean Plasma Glucose 123 mg/dL   eAG (mmol/L) 6.8 mmol/L  Lipid panel  Result Value Ref Range  Cholesterol 189 <200 mg/dL   HDL 51 > OR = 50 mg/dL   Triglycerides 811 (H) <150 mg/dL   LDL Cholesterol (Calc) 105 (H) mg/dL (calc)   Total CHOL/HDL Ratio 3.7 <5.0 (calc)   Non-HDL Cholesterol (Calc) 138 (H) <130 mg/dL (calc)  CBC with Differential/Platelet  Result Value Ref Range   WBC 8.1 3.8 - 10.8 Thousand/uL   RBC 4.35 3.80 - 5.10 Million/uL   Hemoglobin 12.3 11.7 - 15.5 g/dL   HCT 91.4 78.2 - 95.6 %   MCV 87.4 80.0 - 100.0 fL   MCH 28.3 27.0 - 33.0 pg   MCHC 32.4 32.0 - 36.0 g/dL   RDW 21.3 08.6 - 57.8 %   Platelets 302 140 - 400 Thousand/uL   MPV 9.2 7.5 - 12.5 fL   Neutro Abs 5,921 1,500 - 7,800 cells/uL   Lymphs Abs  1,369 850 - 3,900 cells/uL   Absolute Monocytes 567 200 - 950 cells/uL   Eosinophils Absolute 146 15 - 500 cells/uL   Basophils Absolute 97 0 - 200 cells/uL   Neutrophils Relative % 73.1 %   Total Lymphocyte 16.9 %   Monocytes Relative 7.0 %   Eosinophils Relative 1.8 %   Basophils Relative 1.2 %  COMPLETE METABOLIC PANEL WITH GFR  Result Value Ref Range   Glucose, Bld 97 65 - 99 mg/dL   BUN 14 7 - 25 mg/dL   Creat 4.69 6.29 - 5.28 mg/dL   eGFR 93 > OR = 60 UX/LKG/4.01U2   BUN/Creatinine Ratio SEE NOTE: 6 - 22 (calc)   Sodium 138 135 - 146 mmol/L   Potassium 4.5 3.5 - 5.3 mmol/L   Chloride 101 98 - 110 mmol/L   CO2 29 20 - 32 mmol/L   Calcium 9.1 8.6 - 10.4 mg/dL   Total Protein 6.7 6.1 - 8.1 g/dL   Albumin 4.2 3.6 - 5.1 g/dL   Globulin 2.5 1.9 - 3.7 g/dL (calc)   AG Ratio 1.7 1.0 - 2.5 (calc)   Total Bilirubin 0.5 0.2 - 1.2 mg/dL   Alkaline phosphatase (APISO) 95 37 - 153 U/L   AST 18 10 - 35 U/L   ALT 17 6 - 29 U/L  VITAMIN D 25 Hydroxy (Vit-D Deficiency, Fractures)  Result Value Ref Range   Vit D, 25-Hydroxy 42 30 - 100 ng/mL  T4, free  Result Value Ref Range   Free T4 1.0 0.8 - 1.8 ng/dL  TSH  Result Value Ref Range   TSH 0.88 0.40 - 4.50 mIU/L      Assessment & Plan:   Problem List Items Addressed This Visit   None Visit Diagnoses     COVID-19 virus infection    -  Primary   Relevant Medications   nirmatrelvir/ritonavir (PAXLOVID) 20 x 150 MG & 10 x 100MG  TABS      COVID19 positive Symptom 1st onset 06/16/23 Sick Contact Confirm home test positive today 06/17/23 Mild symptoms improved No red flags. No dyspnea. Only risk factor age 70+, no other significant risk factors. No pulm disease  Supportive care OTC only, monitoring Work note sent Rx as precaution if worsening - ONLY if worse Start Paxlovid, GFR >60, 5 day course as prescribed, regular dosing with normal kidney function GFR >60. Reviewed med interaction checker without interactions Counseling  provided Follow-up criteria given.    Meds ordered this encounter  Medications   nirmatrelvir/ritonavir (PAXLOVID) 20 x 150 MG & 10 x 100MG  TABS    Sig: Take 3 tablets  by mouth 2 (two) times daily for 5 days. (Take nirmatrelvir 150 mg two tablets twice daily for 5 days and ritonavir 100 mg one tablet twice daily for 5 days) Patient GFR is >60    Dispense:  30 tablet    Refill:  0      Follow up plan: Return if symptoms worsen or fail to improve.   Patient verbalizes understanding with the above medical recommendations including the limitation of remote medical advice.  Specific follow-up and call-back criteria were given for patient to follow-up or seek medical care more urgently if needed.  Total duration of direct patient care provided via video conference: 11 minutes   Saralyn Pilar, DO [facility] 06/17/2023, 11:45 AM

## 2023-06-17 NOTE — Telephone Encounter (Signed)
  Aches, low grade fever, cough... tested positive for covid (home)  please advise     Chief Complaint: COVID positive today. Low grade fever, cough, body aches.Would like VV to discuss Paxlovid and needs note for work. Symptoms: Above Frequency: Yesterday Pertinent Negatives: Patient denies SOB Disposition: [] ED /[] Urgent Care (no appt availability in office) / [] Appointment(In office/virtual)/ []  [place] Virtual [facility]/ [] Home Care/ [] Refused Recommended Disposition /[]  Mobile Bus/ [x]  Follow-up with PCP Additional Notes: No answer on FC line.Please advise pt.  Reason for Disposition  [1] COVID-19 diagnosed by positive lab test (e.g., PCR, rapid self-test kit) AND [2] mild symptoms (e.g., cough, fever, others) AND [3] no complications or SOB  Answer Assessment - Initial Assessment Questions 1. COVID-19 DIAGNOSIS: "How do you know that you have COVID?" (e.g., positive lab test or self-test, diagnosed by doctor or NP/PA, symptoms after exposure).     Home test 2. COVID-19 EXPOSURE: "Was there any known exposure to COVID before the symptoms began?" CDC Definition of close contact: within 6 feet (2 meters) for a total of 15 minutes or more over a 24-hour period.      N/a 3. ONSET: "When did the COVID-19 symptoms start?"      Yesterday 4. WORST SYMPTOM: "What is your worst symptom?" (e.g., cough, fever, shortness of breath, muscle aches)     Body aches 5. COUGH: "Do you have a cough?" If Yes, ask: "How bad is the cough?"       Yes 6. FEVER: "Do you have a fever?" If Yes, ask: "What is your temperature, how was it measured, and when did it start?"     99 7. RESPIRATORY STATUS: "Describe your breathing?" (e.g., normal; shortness of breath, wheezing, unable to speak)      Normal 8. BETTER-SAME-WORSE: "Are you getting better, staying the same or getting worse compared to yesterday?"  If getting worse, ask, "In what way?"     Better 9. OTHER SYMPTOMS: "Do you have any other  symptoms?"  (e.g., chills, fatigue, headache, loss of smell or taste, muscle pain, sore throat)     Cough 10. HIGH RISK DISEASE: "Do you have any chronic medical problems?" (e.g., asthma, heart or lung disease, weak immune system, obesity, etc.)       No 11. VACCINE: "Have you had the COVID-19 vaccine?" If Yes, ask: "Which one, how many shots, when did you get it?"       N/a 12. PREGNANCY: "Is there any chance you are pregnant?" "When was your last menstrual period?"       No 13. O2 SATURATION MONITOR:  "Do you use an oxygen saturation monitor (pulse oximeter) at home?" If Yes, ask "What is your reading (oxygen level) today?" "What is your usual oxygen saturation reading?" (e.g., 95%)       N/a  Protocols used: Coronavirus (COVID-19) Diagnosed or Suspected-A-AH

## 2023-11-04 ENCOUNTER — Encounter: Payer: Self-pay | Admitting: Family Medicine

## 2023-11-04 ENCOUNTER — Ambulatory Visit (INDEPENDENT_AMBULATORY_CARE_PROVIDER_SITE_OTHER): Payer: BC Managed Care – PPO | Admitting: Family Medicine

## 2023-11-04 VITALS — BP 124/80 | HR 64 | Ht 62.0 in | Wt 173.0 lb

## 2023-11-04 DIAGNOSIS — R7309 Other abnormal glucose: Secondary | ICD-10-CM | POA: Diagnosis not present

## 2023-11-04 DIAGNOSIS — G8929 Other chronic pain: Secondary | ICD-10-CM

## 2023-11-04 DIAGNOSIS — Z1231 Encounter for screening mammogram for malignant neoplasm of breast: Secondary | ICD-10-CM | POA: Diagnosis not present

## 2023-11-04 DIAGNOSIS — M170 Bilateral primary osteoarthritis of knee: Secondary | ICD-10-CM

## 2023-11-04 DIAGNOSIS — M479 Spondylosis, unspecified: Secondary | ICD-10-CM

## 2023-11-04 DIAGNOSIS — M25561 Pain in right knee: Secondary | ICD-10-CM

## 2023-11-04 DIAGNOSIS — E7800 Pure hypercholesterolemia, unspecified: Secondary | ICD-10-CM

## 2023-11-04 DIAGNOSIS — Z8249 Family history of ischemic heart disease and other diseases of the circulatory system: Secondary | ICD-10-CM | POA: Diagnosis not present

## 2023-11-04 DIAGNOSIS — I10 Essential (primary) hypertension: Secondary | ICD-10-CM

## 2023-11-04 DIAGNOSIS — M25562 Pain in left knee: Secondary | ICD-10-CM

## 2023-11-04 LAB — POCT GLYCOSYLATED HEMOGLOBIN (HGB A1C): Hemoglobin A1C: 5.5 % (ref 4.0–5.6)

## 2023-11-04 NOTE — Progress Notes (Signed)
Subjective:    Patient ID: [name], female    DOB: Sep 18, 1961, 62 y.o.   MRN: [id number]  [name] is a 62 y.o. female presenting on 11/04/2023 for Medical Management of Chronic Issues   HPI  Discussed the use of AI scribe software for clinical note transcription with the patient, who gave verbal consent to proceed.       Elevated A1c New concern. Previous A1c normal. Last lab result A1c up to 5.9, prior range normal 5.3. No history of PreDM Due for repeat POC A1c today Admits drinking too much sweet tea and soda Goal to improve exercise Admits drinking more sodas, drinking more water as well.   Insomnia / Anxiety / Stress Major Depression, chronic recurrent - currently in remission Prior history on chart possible Bipolar, mood disorder Followed by Dr Maryruth Bun Psychiatry + Therapist every other week Still on Sertraline (Zoloft) 50mg  daily No longer taking Trazodone Not taking BDZ either Denies depression, racing thoughts mood swing, agitation, pain breathing problem or other factor for sleep   CHRONIC HTN: Currently controlled Reports good compliance, took meds today. Tolerating well, w/o complaints. Meds - Olmesartan 20mg  daily Lifestyle: - Diet: balanced, unchanged. Low sodium. No caffeine - Exercise: Limited due to time. Denies CP, dyspnea, HA, edema, dizziness / lightheadedness  Fam History Heart Disease The patient also expressed concerns about her family history of heart disease, with both parents having heart issues and her father passing away from a heart attack at the age of 11. She expressed interest in establishing a baseline for her heart health and is considering a consultation with a cardiologist.    Osteoarthritis, multiple joints Tried medicines in past, including Voltaren, AS NEEDED use re ordered refills not taking every day She is interested in [facility] PT for low back and both knees   Health Maintenance: Mammogram ordered, she will  schedule [facility]  She is due for cervical CA screening, she will schedule w GYN     11/04/2023    8:44 AM 04/28/2023    8:07 AM 04/05/2023   10:32 AM  Depression screen PHQ 2/9  Decreased Interest 0 0 0  Down, Depressed, Hopeless 0 0 0  PHQ - 2 Score 0 0 0  Altered sleeping  0 0  Tired, decreased energy  0 0  Change in appetite  0 0  Feeling bad or failure about yourself   0 0  Trouble concentrating  0 0  Moving slowly or fidgety/restless  0 0  Suicidal thoughts  0 0  PHQ-9 Score  0 0  Difficult doing work/chores  Not difficult at all Not difficult at all       11/04/2023    8:44 AM 04/28/2023    8:07 AM 04/05/2023   10:32 AM 01/13/2022    2:03 PM  GAD 7 : Generalized Anxiety Score  Nervous, Anxious, on Edge 0 1 1 0  Control/stop worrying 0 0 0 0  Worry too much - different things 0 0 0 0  Trouble relaxing 0 0 1 0  Restless 0 1 0 0  Easily annoyed or irritable 0 0 1 0  Afraid - awful might happen 0 0 0 0  Total GAD 7 Score 0 2 3 0  Anxiety Difficulty  Not difficult at all  Not difficult at all    Social History   Tobacco Use   Smoking status: Never   Smokeless tobacco: Never  Vaping Use   Vaping status:  Never Used  Substance Use Topics   Alcohol use: No    Comment: social    Drug use: No    Review of Systems Per HPI unless specifically indicated above     Objective:    BP 124/80   Pulse 64   Ht 5\' 2"  (1.575 m)   Wt 173 lb (78.5 kg)   LMP 05/28/2013   BMI 31.64 kg/m   Wt Readings from Last 3 Encounters:  11/04/23 173 lb (78.5 kg)  04/28/23 168 lb 6.4 oz (76.4 kg)  04/05/23 167 lb (75.8 kg)    Physical Exam Vitals and nursing note reviewed.  Constitutional:      General: She is not in acute distress.    Appearance: She is well-developed. She is not diaphoretic.     Comments: Well-appearing, comfortable, cooperative  HENT:     Head: Normocephalic and atraumatic.  Eyes:     General:        Right eye: No discharge.        Left eye: No discharge.      Conjunctiva/sclera: Conjunctivae normal.  Neck:     Thyroid: No thyromegaly.  Cardiovascular:     Rate and Rhythm: Normal rate and regular rhythm.     Heart sounds: Normal heart sounds. No murmur heard. Pulmonary:     Effort: Pulmonary effort is normal. No respiratory distress.     Breath sounds: Normal breath sounds. No wheezing or rales.  Musculoskeletal:        General: Normal range of motion.     Cervical back: Normal range of motion and neck supple.  Lymphadenopathy:     Cervical: No cervical adenopathy.  Skin:    General: Skin is warm and dry.     Findings: No erythema or rash.  Neurological:     Mental Status: She is alert and oriented to person, place, and time.  Psychiatric:        Behavior: Behavior normal.     Comments: Well groomed, good eye contact, normal speech and thoughts     Results for orders placed or performed in visit on 11/04/23  POCT glycosylated hemoglobin (Hb A1C)   Collection Time: 11/04/23  8:33 AM  Result Value Ref Range   Hemoglobin A1C 5.5 4.0 - 5.6 %   HbA1c POC (<> result, manual entry)     HbA1c, POC (prediabetic range)     HbA1c, POC (controlled diabetic range)        Assessment & Plan:   Problem List Items Addressed This Visit     Essential hypertension   Relevant Orders   Ambulatory referral to Cardiology   Hyperlipidemia   Relevant Orders   Ambulatory referral to Cardiology   Other Visit Diagnoses       Elevated hemoglobin A1c    -  Primary   Relevant Orders   POCT glycosylated hemoglobin (Hb A1C) (Completed)     Encounter for screening mammogram for malignant neoplasm of breast       Relevant Orders   MM 3D SCREENING MAMMOGRAM BILATERAL BREAST     Family history of heart disease       Relevant Orders   Ambulatory referral to Cardiology     Osteoarthritis of lower back       Relevant Orders   Ambulatory referral to Physical Therapy     Chronic pain of both knees       Relevant Orders   Ambulatory referral to  Physical Therapy  Primary osteoarthritis of both knees       Relevant Orders   Ambulatory referral to Physical Therapy        Prediabetes Improved to A1c 5.5 from 5.9 -Continue monitoring diet and exercise. Improve water intake, limit soda  Family History of Heart Disease Patient expressed concern due to family history of heart disease. No current symptoms reported. Offer Coronary Calcium CT, we opted for referral instead. -Referral to cardiology for consultation and baseline assessment.  Osteoarthritis Arthritis in Knees bilateral Low Back, arthritis / Pain Previously on topical and other conservative therapy Prior imaging -Referral to physical therapy for both knees and back  General Health Maintenance -Plan for mammogram. Order placed, patient to schedule. -Plan for Pap smear. Patient to schedule GYN appointment. Provided contact information for local clinics. -Colon screening due next year. Plan to discuss at future visit.  Follow-up in March to assess progress with referrals and overall health status.         Orders Placed This Encounter  Procedures   MM 3D SCREENING MAMMOGRAM BILATERAL BREAST    Standing Status:   Future    Expiration Date:   11/03/2024    Reason for Exam (SYMPTOM  OR DIAGNOSIS REQUIRED):   Screening bilateral 3D Mammogram Tomo    Preferred imaging location?:   [facility]   Ambulatory referral to Cardiology    Referral Priority:   Routine    Referral Type:   Consultation    Referral Reason:   Specialty Services Required    Number of Visits Requested:   1   Ambulatory referral to Physical Therapy    Referral Priority:   Routine    Referral Type:   Physical Medicine    Referral Reason:   Specialty Services Required    Requested Specialty:   Physical Therapy    Number of Visits Requested:   1   POCT glycosylated hemoglobin (Hb A1C)    No orders of the defined types were placed in this encounter.   Follow up plan: Return in  about 3 months (around 02/02/2024) for 3 month PreDM A1c, Arthritis/Knee Back PT, Cards updates.   Saralyn Pilar, DO [facility] 11/04/2023, 8:15 AM

## 2023-11-04 NOTE — Patient Instructions (Addendum)
Thank you for coming to the office today.  For Mammogram screening for breast cancer   Call the Imaging Center below anytime to schedule your own appointment now that order has been placed.  [facility] at [facility] [address], [address] [place], [place] [address] Phone: [phone]  For pap smear, call  [facility] [place] [facility] at [facility] [address] [place],  [place]  [address] Main: [phone]   [facility]: Obstetrics and Gynecology [facility] [place] Address: [address], [place], [place] [address] Phone: [phone]  ---------------------  [facility] HeartCare at [facility] [address] [place], [place] [address] Main: [phone]   ----------  Physical Therapy at [facility]   Please schedule a Follow-up Appointment to: Return in about 3 months (around 02/02/2024) for 3 month PreDM A1c, Arthritis/Knee Back PT, Cards updates.  If you have any other questions or concerns, please feel free to call the office or send a message through MyChart. You may also schedule an earlier appointment if necessary.  Additionally, you may be receiving a survey about your experience at our office within a few days to 1 week by e-mail or mail. We value your feedback.  Saralyn Pilar, DO [facility], [place]

## 2023-11-22 ENCOUNTER — Ambulatory Visit: Payer: Self-pay

## 2023-11-22 NOTE — Telephone Encounter (Signed)
Reason for Disposition  Common cold with no complications  Answer Assessment - Initial Assessment Questions 1. ONSET: "When did the nasal discharge start?"      I have fever, sore throat, achy body.   I'm taking Coricidin for the cold symptoms since I have hypertension.     I have 99 fever this morning.   Runny nose.    On Christmas Day my symptoms started.   Yesterday I woke up with a sore throat.   I'm starting to feel better already this morning but just wanted to call and see if there was anything else I needed to be doing.    2. AMOUNT: "How much discharge is there?"      I have runny nose.    I feel much better morning.   3. COUGH: "Do you have a cough?" If Yes, ask: "Describe the color of your sputum" (clear, white, yellow, green)     No 4. RESPIRATORY DISTRESS: "Describe your breathing."      No 5. FEVER: "Do you have a fever?" If Yes, ask: "What is your temperature, how was it measured, and when did it start?"     99 this morning 6. SEVERITY: "Overall, how bad are you feeling right now?" (e.g., doesn't interfere with normal activities, staying home from school/work, staying in bed)      I'm feeling better this morning. 7. OTHER SYMPTOMS: "Do you have any other symptoms?" (e.g., sore throat, earache, wheezing, vomiting)     What's listed above 8. PREGNANCY: "Is there any chance you are pregnant?" "When was your last menstrual period?"     N/A due to age  Protocols used: Common Cold-A-AH  Chief Complaint: Low grade fever, sore throat and nasal congestion, body aches Symptoms: above   Taking Coricidin and Motrin and gargling with warm salt water.   These seem to be helping. Frequency: Started Christmas day  but feeling better today.    "Just wanted to check in and see if there was anything else I needed to be doing".   Pertinent Negatives: Patient denies other symptoms Disposition: [] ED /[] Urgent Care (no appt availability in office) / [] Appointment(In office/virtual)/ []  [facility]/ [x] Home Care/ [] Refused Recommended Disposition /[] [place] Mobile Bus/ []  Follow-up with PCP Additional Notes: Went over home care advice.

## 2023-11-22 NOTE — Telephone Encounter (Signed)
Message from Stephen T sent at 11/22/2023  8:23 AM EST  Summary: sore throat,fever   Patient called said since yesterday she has experienced achy body, stuffy nose, fever and sore throat. No appts before 1/6. Patient is requesting medication for her symptoms. Please f/u with patient         Called pt and LM on VM to call back.

## 2023-11-25 ENCOUNTER — Ambulatory Visit
Admission: EM | Admit: 2023-11-25 | Discharge: 2023-11-25 | Disposition: A | Discharge status: Home / Self Care | Payer: 59

## 2023-11-25 DIAGNOSIS — J0100 Acute maxillary sinusitis, unspecified: Secondary | ICD-10-CM | POA: Diagnosis not present

## 2023-11-25 DIAGNOSIS — R051 Acute cough: Secondary | ICD-10-CM | POA: Diagnosis not present

## 2023-11-25 MED ORDER — AMOXICILLIN-POT CLAVULANATE 875-125 MG PO TABS: 1.0000 | ORAL_TABLET | Freq: Two times a day (BID) | ORAL | 0 refills | Status: DC

## 2023-11-25 MED ORDER — BENZONATATE 100 MG PO CAPS: 100.0000 mg | ORAL_CAPSULE | Freq: Three times a day (TID) | ORAL | 0 refills | Status: DC | PRN

## 2023-11-25 NOTE — Discharge Instructions (Addendum)
Take the Augmentin and Tessalon Perles as directed.  Follow up with your primary care provider if your symptoms are not improving.    

## 2023-11-25 NOTE — ED Triage Notes (Signed)
 Patient to Urgent Care with complaints of cough/ nasal congestion.  Symptoms started Sunday. Feels the congestion has settled in her chest. Cough is productive with thick and green sputum.   Meds: Coricidin/ Dayquil/ salt water/ Motrin.

## 2023-11-25 NOTE — ED Provider Notes (Signed)
 [name]    CSN: [id number] Arrival date & time: 11/25/23  9187      History   Chief Complaint Chief Complaint  Patient presents with   Cough   Nasal Congestion    HPI [name] is a 63 y.o. female.  Patient presents with 4 to 5 day history of congestion, postnasal drip, and cough.  Her mucous has become thick and green.  No fever or shortness of breath.  Multiple OTC treatments attempted.  The history is provided by the patient and medical records.    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      Home Medications    Prior to Admission medications   Medication Sig Start Date End Date Taking? Authorizing Provider  amoxicillin -clavulanate (AUGMENTIN ) 875-125 MG tablet Take 1 tablet by mouth every 12 (twelve) hours. 11/25/23  Yes Corlis Burnard DEL, NP  benzonatate  (TESSALON ) 100 MG capsule Take 1 capsule (100 mg total) by mouth 3 (three) times daily as needed for cough. 11/25/23  Yes Corlis Burnard DEL, NP  clonazePAM (KLONOPIN) 0.5 MG tablet Take 0.5 mg by mouth daily as needed. 11/13/23  Yes [provider]  Biotin 1000 MCG tablet Take by mouth.    [provider]  calcium -vitamin D  (OSCAL WITH D) 500-200 MG-UNIT per tablet Take 1 tablet by mouth daily. For Vitamin D  replacement and mood control. 08/25/12    Vannie Aliene KIDD, MD  diclofenac  (VOLTAREN ) 75 MG EC tablet Take 1 tablet (75 mg total) by mouth 2 (two) times daily as needed for moderate pain or mild pain. 11/26/22   Karamalegos, Marsa PARAS, DO  fluticasone  (FLONASE ) 50 MCG/ACT nasal spray Place 2 sprays into both nostrils daily. Use for 4-6 weeks then stop and use seasonally or as needed. 11/26/22   Karamalegos, Marsa PARAS, DO  ipratropium (ATROVENT ) 0.06 % nasal spray Place 2 sprays into both nostrils 4 (four) times daily. For up to 5-7 days then stop. 04/05/23   Karamalegos, Marsa PARAS, DO  Multiple Vitamin (MULTIVITAMIN WITH MINERALS) TABS Take 1 tablet by mouth daily. For nutritional supplementation. 08/25/12   Vannie Aliene KIDD, MD  olmesartan  (BENICAR ) 20 MG tablet Take 1 tablet (20 mg total) by mouth daily. 04/28/23   Karamalegos, Marsa PARAS, DO  sertraline (ZOLOFT) 50 MG tablet Take 50 mg by mouth daily. 01/09/22   [provider]  triamcinolone  cream (KENALOG ) 0.5 % Apply 1 Application topically 2 (two) times daily. To affected areas, for up to 2 weeks. Patient not taking: Reported on 11/04/2023 05/04/23   Edman Marsa PARAS, DO    Family History Family History  Problem Relation Age of Onset   Arthritis Mother    Hypertension Mother    Hyperlipidemia Mother    Hypothyroidism Mother    Heart attack  Father 31   Sarcoidosis Father        died of sarcoidosis    Bipolar disorder Brother    Anxiety disorder Brother    Depression Brother    Depression Maternal Aunt     Social History Social History   Tobacco Use   Smoking status: Never   Smokeless tobacco: Never  Vaping Use   Vaping status: Never Used  Substance Use Topics   Alcohol use: No    Comment: social    Drug use: No     Allergies   Sulfa antibiotics   Review of Systems Review of Systems  Constitutional:  Negative for chills and fever.  HENT:  Positive for congestion, postnasal drip and rhinorrhea. Negative for ear pain and sore throat.    Respiratory:  Positive for cough. Negative for shortness of breath.      Physical Exam Triage Vital Signs ED Triage Vitals  Encounter Vitals Group     BP 11/25/23 0839 123/82     Systolic BP Percentile --      Diastolic BP Percentile --      Pulse Rate 11/25/23 0839 78     Resp 11/25/23 0839 18     Temp 11/25/23 0839 98.3 F (36.8 C)     Temp src --      SpO2 11/25/23 0839 96 %     Weight --      Height --      Head Circumference --      Peak Flow --      Pain Score 11/25/23 0838 0     Pain Loc --      Pain Education --      Exclude from Growth Chart --    No data found.  Updated Vital Signs BP 123/82   Pulse 78   Temp 98.3 F (36.8 C)   Resp 18   LMP 05/28/2013   SpO2 96%   Visual Acuity Right Eye Distance:   Left Eye Distance:   Bilateral Distance:    Right Eye Near:   Left Eye Near:    Bilateral Near:     Physical Exam Constitutional:      General: She is not in acute distress. HENT:     Right Ear: Tympanic membrane normal.     Left Ear: Tympanic membrane normal.     Nose: Congestion and rhinorrhea present.     Mouth/Throat:     Mouth: Mucous membranes are moist.     Pharynx: Oropharynx is clear.  Cardiovascular:     Rate and Rhythm: Normal rate and regular rhythm.     Heart sounds: Normal heart sounds.  Pulmonary:     Effort: Pulmonary effort is normal. No respiratory distress.     Breath sounds: Normal breath sounds.  Neurological:     Mental Status: She is alert.      UC Treatments / Results  Labs (all labs ordered are listed, but only abnormal results are displayed) Labs Reviewed - No data to display  EKG   Radiology No results found.  Procedures Procedures (including critical care time)  Medications Ordered in UC Medications - No data to display  Initial Impression / Assessment and Plan / UC Course  I have reviewed the triage vital signs and the nursing notes.  Pertinent labs & imaging results that were available during  my care of the patient were reviewed by me and considered in my medical decision making (see chart for details).  Acute sinusitis, cough.  Lungs are clear and O2 sat is 96% on room air.  Patient's symptoms seem to be primarily sinus related.  Treating today with Augmentin .  Treating her cough with Tessalon  Perles.  Education provided on sinus infection and cough.  Instructed patient to follow up with her PCP if her symptoms are not improving.  She agrees to plan of care.   Final Clinical Impressions(s) / UC Diagnoses   Final diagnoses:  Acute non-recurrent maxillary sinusitis  Acute cough     Discharge Instructions      Take the Augmentin  and Tessalon  Perles as directed.  Follow-up with your primary care provider if your symptoms are not improving.      ED Prescriptions     Medication Sig Dispense Auth. Provider   benzonatate  (TESSALON ) 100 MG capsule Take 1 capsule (100 mg total) by mouth 3 (three) times daily as needed for cough. 21 capsule Corlis Burnard DEL, NP   amoxicillin -clavulanate (AUGMENTIN ) 875-125 MG tablet Take 1 tablet by mouth every 12 (twelve) hours. 14 tablet Corlis Burnard DEL, NP      PDMP not reviewed this encounter.   Corlis Burnard DEL, NP 11/25/23 [id number]

## 2023-12-29 ENCOUNTER — Encounter: Payer: Self-pay | Admitting: Family Medicine

## 2023-12-29 ENCOUNTER — Telehealth (INDEPENDENT_AMBULATORY_CARE_PROVIDER_SITE_OTHER): Payer: 59 | Admitting: Family Medicine

## 2023-12-29 DIAGNOSIS — J40 Bronchitis, not specified as acute or chronic: Secondary | ICD-10-CM

## 2023-12-29 MED ORDER — AZITHROMYCIN 250 MG PO TABS: ORAL_TABLET | ORAL | 0 refills | Status: DC

## 2023-12-29 MED ORDER — IPRATROPIUM BROMIDE 0.06 % NA SOLN: 2.0000 | Freq: Four times a day (QID) | NASAL | 0 refills | Status: DC

## 2023-12-29 NOTE — Progress Notes (Signed)
 Subjective:    Patient ID: [name], female    DOB: 1961-09-01, 63 y.o.   MRN: [id number]  [name] is a 63 y.o. female presenting on 12/29/2023 for Bronchitis   Virtual / Telehealth Encounter - Video Visit via MyChart The purpose of this virtual visit is to provide medical care while limiting exposure to the novel coronavirus (COVID19) for both patient and office staff.  Consent was obtained for remote visit:  Yes.   Answered questions that patient had about telehealth interaction:  Yes.   I discussed the limitations, risks, security and privacy concerns of performing an evaluation and management service by video/telephone. I also discussed with the patient that there may be a patient responsible charge related to this service. The patient expressed understanding and agreed to proceed.  Patient Location: Home Provider Location: [facility] (Office)  Participants in virtual visit: - Patient: [name] - CMA: Alan Fontana CMA - Provider: Dr Edman   HPI  Discussed the use of AI scribe software for clinical note transcription with the patient, who gave verbal consent to proceed.  History of Present Illness    [name] is a 63 year old female who presents with cough, congestion, and fever.  She has been experiencing symptoms of sneezing and coughing that began last week and worsened over the weekend. She developed a fever yesterday afternoon and is currently experiencing increased coughing, congestion, and some wheezing. The symptoms are more pronounced in the chest rather than the sinuses. No shortness of breath while sitting still, but she feels winded with physical activity.  In December, she was treated with amoxicillin  for a similar illness at an urgent care facility. She has been using leftover Tessalon  Pearls for her cough and Coricidin for her symptoms. She started taking Tylenol  for the fever.  She has not performed any home testing for  cold, COVID, or flu. She mentions that the person she is dating is experiencing similar symptoms and is currently at urgent care. Additionally, a coworker was prescribed Tamiflu  but does not have the same congestion and cough symptoms.         11/04/2023    8:44 AM 04/28/2023    8:07 AM 04/05/2023   10:32 AM  Depression screen PHQ 2/9  Decreased Interest 0 0 0  Down, Depressed, Hopeless 0 0 0  PHQ - 2 Score 0 0 0  Altered sleeping  0 0  Tired, decreased energy  0 0  Change in appetite  0 0  Feeling bad or failure about yourself   0 0  Trouble concentrating  0 0  Moving slowly or fidgety/restless  0 0  Suicidal thoughts  0 0  PHQ-9 Score  0 0  Difficult doing work/chores  Not difficult at all Not difficult at all       11/04/2023    8:44 AM 04/28/2023    8:07 AM 04/05/2023   10:32 AM 01/13/2022    2:03 PM  GAD 7 : Generalized Anxiety Score  Nervous, Anxious, on Edge 0 1 1 0  Control/stop worrying 0 0 0 0  Worry too much - different things 0 0 0 0  Trouble relaxing 0 0 1 0  Restless 0 1 0 0  Easily annoyed or irritable 0 0 1 0  Afraid - awful might happen 0 0 0 0  Total GAD 7 Score 0 2 3 0  Anxiety Difficulty  Not difficult at all  Not  difficult at all    Social History   Tobacco Use   Smoking status: Never   Smokeless tobacco: Never  Vaping Use   Vaping status: Never Used  Substance Use Topics   Alcohol use: No    Comment: social    Drug use: No    Review of Systems Per HPI unless specifically indicated above     Objective:    LMP 05/28/2013   Wt Readings from Last 3 Encounters:  11/04/23 173 lb (78.5 kg)  04/28/23 168 lb 6.4 oz (76.4 kg)  04/05/23 167 lb (75.8 kg)     Physical Exam  Note examination was completely remotely via video observation objective data only  Gen - well-appearing, no acute distress or apparent pain, comfortable HEENT - eyes appear clear without discharge or redness Heart/Lungs - cannot examine virtually - observed some  cough Abd - cannot examine virtually  Skin - face visible today- no rash Neuro - awake, alert, oriented Psych - not anxious appearing   Results for orders placed or performed in visit on 11/04/23  POCT glycosylated hemoglobin (Hb A1C)   Collection Time: 11/04/23  8:33 AM  Result Value Ref Range   Hemoglobin A1C 5.5 4.0 - 5.6 %   HbA1c POC (<> result, manual entry)     HbA1c, POC (prediabetic range)     HbA1c, POC (controlled diabetic range)        Assessment & Plan:   Problem List Items Addressed This Visit   None Visit Diagnoses       Bronchitis    -  Primary        Bronchitis / Lower Respiratory Infection Symptoms of coughing, congestion, wheezing, and fever for a week. Likely started as a viral infection and has now settled into bronchitis. -Start Azithromycin  (Z-Pak) with two pills on day one and one pill for the next four days. -Continue Coricidin and Tylenol  as needed for symptom management. -Consider chest x-ray if not improving.  Nasal Congestion Ongoing despite current treatment. -Add Atrovent  nasal spray for quick relief.  Work Absence Illness causing missed work days. -Provide two work notes, one for return on Friday and one for return on Monday. Patient to use as needed based on recovery.         No orders of the defined types were placed in this encounter.   Meds ordered this encounter  Medications   azithromycin  (ZITHROMAX  Z-PAK) 250 MG tablet    Sig: Take 2 tabs (500mg  total) on Day 1. Take 1 tab (250mg ) daily for next 4 days.    Dispense:  6 tablet    Refill:  0   ipratropium (ATROVENT ) 0.06 % nasal spray    Sig: Place 2 sprays into both nostrils 4 (four) times daily. For up to 5-7 days then stop.    Dispense:  15 mL    Refill:  0    Follow up plan: Return if symptoms worsen or fail to improve.   Patient verbalizes understanding with the above medical recommendations including the limitation of remote medical advice.  Specific  follow-up and call-back criteria were given for patient to follow-up or seek medical care more urgently if needed.  Total duration of direct patient care provided via video conference: 7 minutes   Marsa Officer, DO [facility] 12/29/2023, 4:49 PM

## 2023-12-29 NOTE — Patient Instructions (Addendum)
 Start Azithromycin  Z pak (antibiotic) 2 tabs day 1, then 1 tab x 4 days, complete entire course even if improved  Start Atrovent  nasal spray decongestant 2 sprays in each nostril up to 4 times daily for 7 days   Please schedule a Follow-up Appointment to: Return if symptoms worsen or fail to improve.  If you have any other questions or concerns, please feel free to call the office or send a message through MyChart. You may also schedule an earlier appointment if necessary.  Additionally, you may be receiving a survey about your experience at our office within a few days to 1 week by e-mail or mail. We value your feedback.  Marsa Officer, DO [facility], [place]

## 2023-12-30 ENCOUNTER — Ambulatory Visit: Payer: Self-pay

## 2023-12-30 DIAGNOSIS — J101 Influenza due to other identified influenza virus with other respiratory manifestations: Secondary | ICD-10-CM

## 2023-12-30 MED ORDER — OSELTAMIVIR PHOSPHATE 75 MG PO CAPS: 75.0000 mg | ORAL_CAPSULE | Freq: Two times a day (BID) | ORAL | 0 refills | Status: DC

## 2023-12-30 NOTE — Addendum Note (Signed)
 Addended by: Raina Bunting on: 12/30/2023 11:55 AM   Modules accepted: Orders

## 2023-12-30 NOTE — Telephone Encounter (Signed)
 Understood. Positive Influenza A now. I will send rx Tamiflu . She may STOP or PAUSE Zpak. She can save it and if symptoms unresolved she can start Zpak after Tamiflu .  I can send inhaler if she requests it as an Albuterol rescue inhaler for cough wheezing. But it is not required.   Rx Tamiflu  sent already. Let me know about inhaler.  Marsa Officer, DO [facility] 12/30/2023, 11:54 AM

## 2023-12-30 NOTE — Telephone Encounter (Signed)
 Message from [name] sent at 12/30/2023  9:53 AM EST  Summary: positive flu test   Pt had virtual visit with provider yesterday and was asked If she had taken a flu or covid test. Pt tested herself this morning and tested positive for Flu A. Pt seeking advice on what to do moving forward as patient was prescribed a zpack for bronchitis.  Please assist pt further         Chief Complaint: positive for Flu this am  Symptoms: nasal congestion, cough, fatigue, often having wheezing after cough Frequency: Tested positive this am -- sx started Tuesday Pertinent Negatives: Patient denies SOB Disposition: [] ED /[] Urgent Care (no appt availability in office) / [] Appointment(In office/virtual)/ []  [place] [facility]/ [] Home Care/ [] Refused Recommended Disposition /[] [place] [facility]/ []  Follow-up with PCP Additional Notes: pt wanting to know: 1. Can she get a prescription for Tamiflu  2. Does she need to continue with the Zpak 3.Does she needs inhaler  Reason for Disposition  [1] Patient is NOT HIGH RISK AND [2] strongly requests antiviral medicine AND [3] flu symptoms present < 48 hours  Answer Assessment - Initial Assessment Questions 1. WORST SYMPTOM: What is your worst symptom? (e.g., cough, runny nose, muscle aches, headache, sore throat, fever)      Nasal congestion, cough, fatigue  2. ONSET: When did your flu symptoms start?      Tuesday  3. COUGH: How bad is the cough?       Clear  4. RESPIRATORY DISTRESS: Describe your breathing.      no 5. FEVER: Do you have a fever? If Yes, ask: What is your temperature, how was it measured, and when did it start?     no 6. EXPOSURE: Were you exposed to someone with influenza?       Yes  8. HIGH RISK DISEASE: Do you have any chronic medical problems? (e.g., heart or lung disease, asthma, weak immune system, or other HIGH RISK conditions)     no 10. OTHER SYMPTOMS: Do you have any other symptoms?  (e.g., runny nose,  muscle aches, headache, sore throat)       no  Protocols used: Influenza (Flu) - [facility]

## 2024-01-20 ENCOUNTER — Other Ambulatory Visit: Payer: Self-pay | Admitting: Family Medicine

## 2024-01-21 ENCOUNTER — Other Ambulatory Visit: Payer: Self-pay

## 2024-01-21 MED ORDER — IPRATROPIUM BROMIDE 0.06 % NA SOLN: 2.0000 | Freq: Four times a day (QID) | NASAL | 0 refills | Status: DC

## 2024-01-21 NOTE — Telephone Encounter (Signed)
 Requested medication (s) are due for refill today: see below  Requested medication (s) are on the active medication list: yes  Last refill:  12/29/23  Future visit scheduled: no  Notes to clinic:    Pharmacy comment: REQUEST FOR 90 DAYS PRESCRIPTION.       Requested Prescriptions  Pending Prescriptions Disp Refills   ipratropium (ATROVENT) 0.06 % nasal spray [Pharmacy Med Name: IPRATROPIUM 0.06% SPRAY]  1    Sig: PLACE 2 SPRAYS INTO BOTH NOSTRILS 4 (FOUR) TIMES DAILY. FOR UP TO 5-7 DAYS THEN STOP.     Off-Protocol Failed - 01/21/2024  1:49 PM      Failed - Medication not assigned to a protocol, review manually.      Passed - Valid encounter within last 12 months    Recent Outpatient Visits           2 months ago Elevated hemoglobin A1c   [place] [facility] Smitty Cords, DO   7 months ago COVID-19 virus infection   [place] [facility] [place], Netta Neat, DO   8 months ago Poison ivy dermatitis   [place] [facility] Smitty Cords, DO   8 months ago Annual physical exam   [place] [facility] Smitty Cords, DO   9 months ago Tonsillitis   [place] [facility] Smitty Cords, DO       Future Appointments             In 1 week Pricilla Riffle, MD [facility] [place] [facility]           Off-Protocol Failed - 01/21/2024  1:49 PM      Failed - Medication not assigned to a protocol, review manually.      Passed - Valid encounter within last 12 months    Recent Outpatient Visits           2 months ago Elevated hemoglobin A1c   [facility] Smitty Cords, DO   7 months ago COVID-19 virus infection   [facility] [facility] [place], Netta Neat, DO   8 months ago Poison ivy dermatitis   [place] [facility] Smitty Cords, DO   8 months ago Annual physical exam   [place] [facility] Smitty Cords, DO   9 months ago Tonsillitis   [place] [facility] Smitty Cords, DO       Future Appointments             In 1 week Pricilla Riffle, MD [facility] at [facility], [facility]

## 2024-01-28 ENCOUNTER — Ambulatory Visit: Payer: 59 | Admitting: Internal Medicine

## 2024-02-03 ENCOUNTER — Ambulatory Visit: Payer: Self-pay | Admitting: Family Medicine

## 2024-03-31 ENCOUNTER — Encounter

## 2024-03-31 ENCOUNTER — Encounter (HOSPITAL_COMMUNITY): Payer: Self-pay

## 2024-04-07 ENCOUNTER — Ambulatory Visit
Admission: RE | Admit: 2024-04-07 | Discharge: 2024-04-07 | Disposition: A | Discharge status: Home / Self Care | Source: Ambulatory Visit | Attending: Family Medicine | Admitting: Family Medicine

## 2024-04-07 DIAGNOSIS — Z1231 Encounter for screening mammogram for malignant neoplasm of breast: Secondary | ICD-10-CM | POA: Diagnosis present

## 2024-05-01 ENCOUNTER — Other Ambulatory Visit: Payer: Self-pay | Admitting: Family Medicine

## 2024-05-01 DIAGNOSIS — I10 Essential (primary) hypertension: Secondary | ICD-10-CM

## 2024-05-01 DIAGNOSIS — E559 Vitamin D deficiency, unspecified: Secondary | ICD-10-CM

## 2024-05-01 DIAGNOSIS — E7800 Pure hypercholesterolemia, unspecified: Secondary | ICD-10-CM

## 2024-05-01 DIAGNOSIS — Z0000 Encounter for general adult medical examination without abnormal findings: Secondary | ICD-10-CM

## 2024-05-01 DIAGNOSIS — R7309 Other abnormal glucose: Secondary | ICD-10-CM

## 2024-05-02 ENCOUNTER — Other Ambulatory Visit

## 2024-05-03 LAB — COMPREHENSIVE METABOLIC PANEL WITH GFR
AG Ratio: 1.8 (calc) (ref 1.0–2.5)
ALT: 15 U/L (ref 6–29)
AST: 18 U/L (ref 10–35)
Albumin: 4.2 g/dL (ref 3.6–5.1)
Alkaline phosphatase (APISO): 72 U/L (ref 37–153)
BUN: 20 mg/dL (ref 7–25)
CO2: 28 mmol/L (ref 20–32)
Calcium: 8.7 mg/dL (ref 8.6–10.4)
Chloride: 103 mmol/L (ref 98–110)
Creat: 0.75 mg/dL (ref 0.50–1.05)
Globulin: 2.4 g/dL (ref 1.9–3.7)
Glucose, Bld: 92 mg/dL (ref 65–99)
Potassium: 4.3 mmol/L (ref 3.5–5.3)
Sodium: 137 mmol/L (ref 135–146)
Total Bilirubin: 0.5 mg/dL (ref 0.2–1.2)
Total Protein: 6.6 g/dL (ref 6.1–8.1)
eGFR: 91 mL/min/{1.73_m2} (ref >=60)

## 2024-05-03 LAB — HEMOGLOBIN A1C
Hgb A1c MFr Bld: 5.7 % — ABNORMAL HIGH (ref <5.7)
Mean Plasma Glucose: 117 mg/dL
eAG (mmol/L): 6.5 mmol/L

## 2024-05-03 LAB — CBC WITH DIFFERENTIAL/PLATELET
Absolute Lymphocytes: 1145 {cells}/uL (ref 850–3900)
Absolute Monocytes: 478 {cells}/uL (ref 200–950)
Basophils Absolute: 47 {cells}/uL (ref 0–200)
Basophils Relative: 0.8 %
Eosinophils Absolute: 142 {cells}/uL (ref 15–500)
Eosinophils Relative: 2.4 %
HCT: 39.7 % (ref 35.0–45.0)
Hemoglobin: 12.8 g/dL (ref 11.7–15.5)
MCH: 28.1 pg (ref 27.0–33.0)
MCHC: 32.2 g/dL (ref 32.0–36.0)
MCV: 87.3 fL (ref 80.0–100.0)
MPV: 9.5 fL (ref 7.5–12.5)
Monocytes Relative: 8.1 %
Neutro Abs: 4089 {cells}/uL (ref 1500–7800)
Neutrophils Relative %: 69.3 %
Platelets: 257 10*3/uL (ref 140–400)
RBC: 4.55 10*6/uL (ref 3.80–5.10)
RDW: 13.8 % (ref 11.0–15.0)
Total Lymphocyte: 19.4 %
WBC: 5.9 10*3/uL (ref 3.8–10.8)

## 2024-05-03 LAB — TSH: TSH: 1.57 m[IU]/L (ref 0.40–4.50)

## 2024-05-03 LAB — LIPID PANEL
Cholesterol: 188 mg/dL (ref <200)
HDL: 54 mg/dL (ref >=50)
LDL Cholesterol (Calc): 114 mg/dL — ABNORMAL HIGH
Non-HDL Cholesterol (Calc): 134 mg/dL — ABNORMAL HIGH (ref <130)
Total CHOL/HDL Ratio: 3.5 (calc) (ref <5.0)
Triglycerides: 98 mg/dL (ref <150)

## 2024-05-03 LAB — T4, FREE: Free T4: 1.1 ng/dL (ref 0.8–1.8)

## 2024-05-03 LAB — VITAMIN D 25 HYDROXY (VIT D DEFICIENCY, FRACTURES): Vit D, 25-Hydroxy: 58 ng/mL (ref 30–100)

## 2024-05-09 ENCOUNTER — Encounter: Payer: Self-pay | Admitting: Family Medicine

## 2024-05-09 ENCOUNTER — Ambulatory Visit (INDEPENDENT_AMBULATORY_CARE_PROVIDER_SITE_OTHER): Admitting: Family Medicine

## 2024-05-09 VITALS — BP 124/78 | HR 72 | Ht 62.0 in | Wt 172.4 lb

## 2024-05-09 DIAGNOSIS — E7800 Pure hypercholesterolemia, unspecified: Secondary | ICD-10-CM | POA: Diagnosis not present

## 2024-05-09 DIAGNOSIS — I10 Essential (primary) hypertension: Secondary | ICD-10-CM

## 2024-05-09 DIAGNOSIS — Z0000 Encounter for general adult medical examination without abnormal findings: Secondary | ICD-10-CM | POA: Diagnosis not present

## 2024-05-09 DIAGNOSIS — Z1211 Encounter for screening for malignant neoplasm of colon: Secondary | ICD-10-CM

## 2024-05-09 DIAGNOSIS — Z8349 Family history of other endocrine, nutritional and metabolic diseases: Secondary | ICD-10-CM | POA: Insufficient documentation

## 2024-05-09 DIAGNOSIS — R7309 Other abnormal glucose: Secondary | ICD-10-CM | POA: Diagnosis not present

## 2024-05-09 DIAGNOSIS — Z780 Asymptomatic menopausal state: Secondary | ICD-10-CM

## 2024-05-09 DIAGNOSIS — M8589 Other specified disorders of bone density and structure, multiple sites: Secondary | ICD-10-CM

## 2024-05-09 NOTE — Progress Notes (Signed)
 Subjective:    Patient ID: [name], female    DOB: 1961-04-05, 63 y.o.   MRN: [id number]  [name] is a 63 y.o. female presenting on 05/09/2024 for Annual Exam   HPI  Discussed the use of AI scribe software for clinical note transcription with the patient, who gave verbal consent to proceed.  History of Present Illness   [name] is a 63 year old female who presents for an annual physical exam.   She is working on reducing sugar intake and increasing physical activity, including using a treadmill to achieve 10,000 steps a day. She has been eating healthier, including smoothies and home-cooked meals, and reducing eating out since her mother returned from rehab in April. She is not on cholesterol medication.  HYPERLIPIDEMIA: - Reports no concerns. Last lipid panel 04/2024, with LDL 114, prior range 110s to 130s Not on statin or cholesterol medicine Lifestyle - Diet: improving diet - Exercise: walking exercise  Fam History Heart Disease Upcoming apt with Cardiology The patient also expressed concerns about her family history of heart disease, with both parents having heart issues and her father passing away from a heart attack at the age of 34.   Elevated A1c A1c 5.7, prior range 5.5 to 5.9 She is improving diet and reducing intake of soda and tea She has treadmill inc steps, goal 10k steps No fam history of Diabetes   Insomnia / Anxiety / Stress Major Depression, chronic recurrent - currently in remission Prior history on chart possible Bipolar, mood disorder Followed by Dr Bradford Cadet Psychiatry + Therapist every other week She is currently taking sertraline (Zoloft) 100 mg for anxiety, clonazepam 0.5 mg as needed, and buspirone  10 mg twice a day. She has increased her sertraline dose due to increased anxiety related to her mother's health issues.   CHRONIC HTN: She has stopped taking olmesartan  (Benicar ) 20 mg for blood pressure after noticing low readings, such as  90/60, and is monitoring her blood pressure as she prepares to return to work.  Meds - None off of med. Lifestyle: - Diet: balanced, unchanged. Low sodium. No caffeine - Exercise: Limited due to time. Denies CP, dyspnea, HA, edema, dizziness / lightheadedness     Osteoarthritis, multiple joints Tried medicines in past, including Voltaren , AS NEEDED use re ordered refills not taking every day L5-S1 Spondylolisthesis / DDD [facility] Has had X-rays and upcoming PT She has a history of spondylolisthesis at L5-S1, with recent x-rays showing further deterioration. She is planning to start physical therapy and is considering a local facility in [place] for convenience.  Family History of Hypothyroidism (Daughter, and Mother) She has not had any problems. Last labs TSH T4 normal Not on treatment    Health Maintenance:  Ordered Cologuard for screening, no prior cologuard done. She had prior Colonoscopy approx 2015, 10 years ago.  Mammogram completed 04/07/24 negative.   She is due for cervical CA screening, she will schedule w GYN - arranging w/ [facility], requesting records from previous [facility] to be sent. Then will schedule      05/09/2024    1:37 PM 11/04/2023    8:44 AM 04/28/2023    8:07 AM  Depression screen PHQ 2/9  Decreased Interest 0 0 0  Down, Depressed, Hopeless 0 0 0  PHQ - 2 Score 0 0 0  Altered sleeping 0  0  Tired, decreased energy 0  0  Change in appetite 0  0  Feeling bad or failure about yourself  0  0  Trouble concentrating 0  0  Moving slowly or fidgety/restless 0  0  Suicidal thoughts 0  0  PHQ-9 Score 0  0  Difficult doing work/chores   Not difficult at all       05/09/2024    1:38 PM 11/04/2023    8:44 AM 04/28/2023    8:07 AM 04/05/2023   10:32 AM  GAD 7 : Generalized Anxiety Score  Nervous, Anxious, on Edge 1 0 1 1  Control/stop worrying 1 0 0 0  Worry too much - different things 0 0 0 0  Trouble  relaxing 0 0 0 1  Restless 0 0 1 0  Easily annoyed or irritable 1 0 0 1  Afraid - awful might happen 0 0 0 0  Total GAD 7 Score 3 0 2 3  Anxiety Difficulty Not difficult at all  Not difficult at all      Past Medical History:  Diagnosis Date   Anxiety    Hyperlipidemia    Past Surgical History:  Procedure Laterality Date   BUNIONECTOMY Left    WISDOM TOOTH EXTRACTION     Social History   Socioeconomic History   Marital status: Divorced    Spouse name: Not on file   Number of children: 2   Years of education: Not on file   Highest education level: Some college, no degree  Occupational History   Occupation: [employer]    Comment: full time  Tobacco Use   Smoking status: Never   Smokeless tobacco: Never  Vaping Use   Vaping status: Never Used  Substance and Sexual Activity   Alcohol use: No    Comment: social    Drug use: No   Sexual activity: Never  Other Topics Concern   Not on file  Social History Narrative   Single.   2 children.   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   Social Drivers of Corporate investment banker Strain: Low Risk  (11/04/2023)   Overall Financial Resource Strain (CARDIA)    Difficulty of Paying Living Expenses: Not hard at all  Food Insecurity: No Food Insecurity (11/04/2023)   Hunger Vital Sign    Worried About Running Out of Food in the Last Year: Never true    Ran Out of Food in the Last Year: Never true  Transportation Needs: No Transportation Needs (11/04/2023)   PRAPARE - Administrator, Civil Service (Medical): No    Lack of Transportation (Non-Medical): No  Physical Activity: Unknown (11/04/2023)   Exercise Vital Sign    Days of Exercise per Week: 0 days    Minutes of Exercise per Session: Not on file  Stress: No Stress Concern Present (11/04/2023)   [employer] of Occupational Health - Occupational Stress Questionnaire    Feeling of Stress : Not at all  Social Connections:  Moderately Integrated (11/04/2023)   Social Connection and Isolation Panel    Frequency of Communication with Friends and Family: More than three times a week    Frequency of Social Gatherings with Friends and Family: Twice a week    Attends Religious Services: 1 to 4 times per year    Active Member of [employer] or Organizations: No    Attends Engineer, structural: More than 4 times per year    Marital Status: Divorced  Catering manager Violence: Not At  Risk (11/04/2023)   Humiliation, Afraid, Rape, and Kick questionnaire    Fear of Current or Ex-Partner: No    Emotionally Abused: No    Physically Abused: No    Sexually Abused: No   Family History  Problem Relation Age of Onset   Arthritis Mother    Hypertension Mother    Hyperlipidemia Mother    Hypothyroidism Mother    Heart attack Father 13   Sarcoidosis Father        died of sarcoidosis    Breast cancer Maternal Aunt 45 - 7   Depression Maternal Aunt    Bipolar disorder Brother    Anxiety disorder Brother    Depression Brother    Current Outpatient Medications on File Prior to Visit  Medication Sig   Biotin 1000 MCG tablet Take by mouth.   busPIRone  (BUSPAR ) 10 MG tablet Take 10 mg by mouth 2 (two) times daily.   calcium -vitamin D  (OSCAL WITH D) 500-200 MG-UNIT per tablet Take 1 tablet by mouth daily. For Vitamin D  replacement and mood control.   clonazePAM (KLONOPIN) 0.5 MG tablet Take 0.5 mg by mouth daily as needed.   diclofenac  (VOLTAREN ) 75 MG EC tablet Take 1 tablet (75 mg total) by mouth 2 (two) times daily as needed for moderate pain or mild pain.   Multiple Vitamin (MULTIVITAMIN WITH MINERALS) TABS Take 1 tablet by mouth daily. For nutritional supplementation.   sertraline (ZOLOFT) 100 MG tablet Take 100 mg by mouth every morning.   No current facility-administered medications on file prior to visit.    Review of Systems  Constitutional:  Negative for activity change, appetite change, chills,  diaphoresis, fatigue and fever.  HENT:  Negative for congestion and hearing loss.   Eyes:  Negative for visual disturbance.  Respiratory:  Negative for cough, chest tightness, shortness of breath and wheezing.   Cardiovascular:  Negative for chest pain, palpitations and leg swelling.  Gastrointestinal:  Negative for abdominal pain, constipation, diarrhea, nausea and vomiting.  Genitourinary:  Negative for dysuria, frequency and hematuria.  Musculoskeletal:  Negative for arthralgias and neck pain.  Skin:  Negative for rash.  Neurological:  Negative for dizziness, weakness, light-headedness, numbness and headaches.  Hematological:  Negative for adenopathy.  Psychiatric/Behavioral:  Negative for behavioral problems, dysphoric mood and sleep disturbance.    Per HPI unless specifically indicated above     Objective:    BP 124/78 (BP Location: Right Arm, Patient Position: Sitting, Cuff Size: Normal)   Pulse 72   Ht 5' 2 (1.575 m)   Wt 172 lb 6 oz (78.2 kg)   LMP 05/28/2013   SpO2 95%   BMI 31.53 kg/m   Wt Readings from Last 3 Encounters:  05/09/24 172 lb 6 oz (78.2 kg)  11/04/23 173 lb (78.5 kg)  04/28/23 168 lb 6.4 oz (76.4 kg)    Physical Exam Vitals and nursing note reviewed.  Constitutional:      General: She is not in acute distress.    Appearance: She is well-developed. She is not diaphoretic.     Comments: Well-appearing, comfortable, cooperative  HENT:     Head: Normocephalic and atraumatic.     Right Ear: Tympanic membrane, ear canal and external ear normal. There is no impacted cerumen.     Left Ear: Tympanic membrane, ear canal and external ear normal. There is no impacted cerumen.   Eyes:     General:        Right eye: No discharge.  Left eye: No discharge.     Conjunctiva/sclera: Conjunctivae normal.     Pupils: Pupils are equal, round, and reactive to light.   Neck:     Thyroid : No thyromegaly.     Vascular: No carotid bruit.   Cardiovascular:      Rate and Rhythm: Normal rate and regular rhythm.     Pulses: Normal pulses.     Heart sounds: Normal heart sounds. No murmur heard. Pulmonary:     Effort: Pulmonary effort is normal. No respiratory distress.     Breath sounds: Normal breath sounds. No wheezing or rales.  Abdominal:     General: Bowel sounds are normal. There is no distension.     Palpations: Abdomen is soft. There is no mass.     Tenderness: There is no abdominal tenderness.   Musculoskeletal:        General: No tenderness. Normal range of motion.     Cervical back: Normal range of motion and neck supple.     Right lower leg: No edema.     Left lower leg: No edema.     Comments: Upper / Lower Extremities: - Normal muscle tone, strength bilateral upper extremities 5/5, lower extremities 5/5  Lymphadenopathy:     Cervical: No cervical adenopathy.   Skin:    General: Skin is warm and dry.     Findings: No erythema or rash.   Neurological:     Mental Status: She is alert and oriented to person, place, and time.     Comments: Distal sensation intact to light touch all extremities  Psychiatric:        Mood and Affect: Mood normal.        Behavior: Behavior normal.        Thought Content: Thought content normal.     Comments: Well groomed, good eye contact, normal speech and thoughts     Results for orders placed or performed in visit on 05/01/24  TSH   Collection Time: 05/02/24  7:55 AM  Result Value Ref Range   TSH 1.57 0.40 - 4.50 mIU/L  Lipid panel   Collection Time: 05/02/24  7:55 AM  Result Value Ref Range   Cholesterol 188 <200 mg/dL   HDL 54 > OR = 50 mg/dL   Triglycerides 98 <161 mg/dL   LDL Cholesterol (Calc) 114 (H) mg/dL (calc)   Total CHOL/HDL Ratio 3.5 <5.0 (calc)   Non-HDL Cholesterol (Calc) 134 (H) <130 mg/dL (calc)  Hemoglobin W9U   Collection Time: 05/02/24  7:55 AM  Result Value Ref Range   Hgb A1c MFr Bld 5.7 (H) <5.7 %   Mean Plasma Glucose 117 mg/dL   eAG (mmol/L) 6.5 mmol/L  CBC  with Differential/Platelet   Collection Time: 05/02/24  7:55 AM  Result Value Ref Range   WBC 5.9 3.8 - 10.8 Thousand/uL   RBC 4.55 3.80 - 5.10 Million/uL   Hemoglobin 12.8 11.7 - 15.5 g/dL   HCT 04.5 40.9 - 81.1 %   MCV 87.3 80.0 - 100.0 fL   MCH 28.1 27.0 - 33.0 pg   MCHC 32.2 32.0 - 36.0 g/dL   RDW 91.4 78.2 - 95.6 %   Platelets 257 140 - 400 Thousand/uL   MPV 9.5 7.5 - 12.5 fL   Neutro Abs 4,089 1,500 - 7,800 cells/uL   Absolute Lymphocytes 1,145 850 - 3,900 cells/uL   Absolute Monocytes 478 200 - 950 cells/uL   Eosinophils Absolute 142 15 - 500 cells/uL   Basophils Absolute  47 0 - 200 cells/uL   Neutrophils Relative % 69.3 %   Total Lymphocyte 19.4 %   Monocytes Relative 8.1 %   Eosinophils Relative 2.4 %   Basophils Relative 0.8 %  Comprehensive metabolic panel with GFR   Collection Time: 05/02/24  7:55 AM  Result Value Ref Range   Glucose, Bld 92 65 - 99 mg/dL   BUN 20 7 - 25 mg/dL   Creat 9.52 8.41 - 3.24 mg/dL   eGFR 91 > OR = 60 MW/NUU/7.25D6   BUN/Creatinine Ratio SEE NOTE: 6 - 22 (calc)   Sodium 137 135 - 146 mmol/L   Potassium 4.3 3.5 - 5.3 mmol/L   Chloride 103 98 - 110 mmol/L   CO2 28 20 - 32 mmol/L   Calcium  8.7 8.6 - 10.4 mg/dL   Total Protein 6.6 6.1 - 8.1 g/dL   Albumin 4.2 3.6 - 5.1 g/dL   Globulin 2.4 1.9 - 3.7 g/dL (calc)   AG Ratio 1.8 1.0 - 2.5 (calc)   Total Bilirubin 0.5 0.2 - 1.2 mg/dL   Alkaline phosphatase (APISO) 72 37 - 153 U/L   AST 18 10 - 35 U/L   ALT 15 6 - 29 U/L  T4, free   Collection Time: 05/02/24  7:55 AM  Result Value Ref Range   Free T4 1.1 0.8 - 1.8 ng/dL  VITAMIN D  25 Hydroxy (Vit-D Deficiency, Fractures)   Collection Time: 05/02/24  7:55 AM  Result Value Ref Range   Vit D, 25-Hydroxy 58 30 - 100 ng/mL      Assessment & Plan:   Problem List Items Addressed This Visit     Essential hypertension   Family history of hypothyroidism   Hyperlipidemia   Other Visit Diagnoses       Annual physical exam    -  Primary      Elevated hemoglobin A1c         Screen for colon cancer       Relevant Orders   Cologuard     Postmenopausal estrogen deficiency       Relevant Orders   DG Bone Density     Osteopenia of multiple sites       Relevant Orders   DG Bone Density        Updated Health Maintenance information Reviewed recent lab results with patient Encouraged improvement to lifestyle with diet and exercise Goal of weight loss   Lumbar Spondylolisthesis Followed by [facility] in [place] Spondylolisthesis at L5-S1 with progression on x-rays. No current pain. Physical therapy recommended. - Proceed w/ physical therapy for spondylolisthesis. - Consider local physical therapy options, including [facility].  Hypertension Blood pressure well-controlled without medication. Previously on olmesartan , discontinued due to low readings. Monitoring closely with return to work. - Monitor blood pressure regularly. - Reassess need for antihypertensive medication if blood pressure increases.  Hyperlipidemia LDL cholesterol slightly elevated at 114 mg/dL but improved. Dietary changes and increased physical activity ongoing. Family history of heart disease. Current 10-year risk of heart attack or stroke is 4.5%. - Continue dietary modifications and exercise. - Monitor cholesterol levels every 6 to 12 months. - Discuss heart scan with cardiologist in September.  Prediabetes Hemoglobin A1c is 5.7%, indicating prediabetes. Previous A1c was 5.9%. Lifestyle modifications ongoing. Family history of diabetes. - Continue lifestyle modifications, including diet and exercise. - Monitor A1c every 6 to 12 months.  Anxiety Managed by Dr. Bradford Cadet with sertraline, buspirone , and clonazepam. Recent increase in sertraline  due to increased anxiety. - Continue current psychiatric medications as prescribed by Dr. Bradford Cadet - Sertraline dose was inc from 50 to 100mg  - Monitor anxiety symptoms and adjust treatment as  needed.  General Health Maintenance Annual wellness visit completed. Mammogram done. Pap smear needs update. Colon cancer screening discussed. Shingles vaccination considered. Normal thyroid  and chemistry panels. Family history of low thyroid . Bone density monitoring discussed due to osteopenia. - Order Cologuard for colon cancer screening. - Arrange for Pap smear update at [facility]. - Consider shingles vaccination; provide informational flyer. - Order bone density scan at the same location as mammograms.  Follow-up Plan for follow-up visits and tests to monitor ongoing health conditions and screenings. - Schedule follow-up visit in December for diabetes and cholesterol monitoring. - Arrange blood work one week prior to follow-up visit.           Orders Placed This Encounter  Procedures   DG Bone Density    Standing Status:   Future    Expiration Date:   05/09/2025    Reason for Exam (SYMPTOM  OR DIAGNOSIS REQUIRED):   postmenopausal estrogen deficiency, osteopenia    Preferred imaging location?:   [facility]   Cologuard    No orders of the defined types were placed in this encounter.    Follow up plan: Return for 6 month fasting lab > 1 week later Follow-up PreDM Cholesterol.  Domingo Friend, DO [facility] 05/09/2024, 1:53 PM

## 2024-05-09 NOTE — Patient Instructions (Addendum)
 Thank you for coming to the office today.  Keep on medication per Dr Bradford Cadet  -----------------------------------------------  For DEXA Scan (Bone mineral density) screening for osteoporosis  Call the Imaging Center below anytime to schedule your own appointment now that order has been placed.  [facility] at [facility] [address] [place], [place] [address] Phone: [phone]  -----------------------  Colon Cancer Screening: Ordered the Cologuard (home kit) test for colon cancer screening. Stay tuned for further updates.  It will be shipped to you directly. If not received in 2-4 weeks, call us  or the company.   If you send it back and no results are received in 2-4 weeks, call us  or the company as well!   Colon Cancer Screening: - For all adults age 4+ routine colon cancer screening is highly recommended.     - Recent guidelines from American Cancer Society recommend starting age of 24 - Early detection of colon cancer is important, because often there are no warning signs or symptoms, also if found early usually it can be cured. Late stage is hard to treat.   - If Cologuard is NEGATIVE, then it is good for 3 years before next due - If Cologuard is POSITIVE, then it is strongly advised to get a Colonoscopy, which allows the GI doctor to locate the source of the cancer or polyp (even very early stage) and treat it by removing it. ------------------------- Follow instructions to collect sample, you may call the company for any help or questions, 24/7 telephone support at [phone].   Please schedule a Follow-up Appointment to: Return for 6 month fasting lab > 1 week later Follow-up PreDM Cholesterol.  If you have any other questions or concerns, please feel free to call the office or send a message through MyChart. You may also schedule an earlier appointment if necessary.  Additionally, you may be receiving a survey about your experience at our  office within a few days to 1 week by e-mail or mail. We value your feedback.  Domingo Friend, DO [facility], [place]

## 2024-05-12 ENCOUNTER — Other Ambulatory Visit: Payer: Self-pay | Admitting: Family Medicine

## 2024-05-12 DIAGNOSIS — I10 Essential (primary) hypertension: Secondary | ICD-10-CM

## 2024-05-15 NOTE — Telephone Encounter (Signed)
 Unable to refill per protocol, Rx expired. Discontinued 05/09/24.  Requested Prescriptions  Pending Prescriptions Disp Refills   olmesartan  (BENICAR ) 20 MG tablet [Pharmacy Med Name: OLMESARTAN  MEDOXOMIL 20 MG TAB] 90 tablet 3    Sig: TAKE 1 TABLET BY MOUTH EVERY DAY     Cardiovascular:  Angiotensin Receptor Blockers Passed - 05/15/2024  3:26 PM      Passed - Cr in normal range and within 180 days    Creat  Date Value Ref Range Status  05/02/2024 0.75 0.50 - 1.05 mg/dL Final         Passed - K in normal range and within 180 days    Potassium  Date Value Ref Range Status  05/02/2024 4.3 3.5 - 5.3 mmol/L Final  08/30/2012 3.6 3.5 - 5.1 mmol/L Final         Passed - Patient is not pregnant      Passed - Last BP in normal range    BP Readings from Last 1 Encounters:  05/09/24 124/78         Passed - Valid encounter within last 6 months    Recent Outpatient Visits           6 days ago Annual physical exam   [facility]a PARAS, DO   4 months ago Bronchitis   [facility] PARAS, DO       Future Appointments             In 2 months [name] Lonni CROME, MD [facility] of [facility] [employer]

## 2024-06-09 ENCOUNTER — Encounter: Payer: Self-pay | Admitting: Advanced Practice Midwife

## 2024-06-22 ENCOUNTER — Ambulatory Visit: Payer: Self-pay | Admitting: Family Medicine

## 2024-06-22 LAB — COLOGUARD: COLOGUARD: NEGATIVE

## 2024-07-30 NOTE — Progress Notes (Deleted)
 Cardiology Office Note:    Date:  07/30/2024   ID:  [name], DOB 1961/08/28, MRN [id number]  PCP:  Edman Marsa PARAS, DO  Cardiologist:  None  Electrophysiologist:  None   Referring MD: Edman Marsa *   No chief complaint on file. ***  History of Present Illness:    [name] is a 63 y.o. female with a hx of hypertension, hyperlipidemia who is referred by Dr. Edman for evaluation of hyperlipidemia and family history of heart disease.  Past Medical History:  Diagnosis Date   Anxiety    Hyperlipidemia     Past Surgical History:  Procedure Laterality Date   BUNIONECTOMY Left    WISDOM TOOTH EXTRACTION      Current Medications: No outpatient medications have been marked as taking for the 08/01/24 encounter (Appointment) with [name] Lonni CROME, MD.     Allergies:   Sulfa antibiotics   Social History   Socioeconomic History   Marital status: Divorced    Spouse name: Not on file   Number of children: 2   Years of education: Not on file   Highest education level: Some college, no degree  Occupational History   Occupation: [employer]    Comment: full time  Tobacco Use   Smoking status: Never   Smokeless tobacco: Never  Vaping Use   Vaping status: Never Used  Substance and Sexual Activity   Alcohol use: No    Comment: social    Drug use: No   Sexual activity: Never  Other Topics Concern   Not on file  Social History Narrative   Single.   2 children.   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   Social Drivers of Corporate investment banker Strain: Low Risk  (11/04/2023)   Overall Financial Resource Strain (CARDIA)    Difficulty of Paying Living Expenses: Not hard at all  Food Insecurity: No Food Insecurity (11/04/2023)   Hunger Vital Sign    Worried About Running Out of Food in the Last Year: Never true    Ran Out of Food in the Last Year: Never true  Transportation Needs: No Transportation  Needs (11/04/2023)   PRAPARE - Administrator, Civil Service (Medical): No    Lack of Transportation (Non-Medical): No  Physical Activity: Unknown (11/04/2023)   Exercise Vital Sign    Days of Exercise per Week: 0 days    Minutes of Exercise per Session: Not on file  Stress: No Stress Concern Present (11/04/2023)   [employer] of Occupational Health - Occupational Stress Questionnaire    Feeling of Stress : Not at all  Social Connections: Moderately Integrated (11/04/2023)   Social Connection and Isolation Panel    Frequency of Communication with Friends and Family: More than three times a week    Frequency of Social Gatherings with Friends and Family: Twice a week    Attends Religious Services: 1 to 4 times per year    Active Member of [employer] or Organizations: No    Attends Engineer, structural: More than 4 times per year    Marital Status: Divorced     Family History: The patient's ***family history includes Anxiety disorder in her brother; Arthritis in her mother; Bipolar disorder in her brother; Breast cancer (age of onset: 55 - 52) in her maternal aunt; Depression in her brother and maternal aunt; Heart attack (age of onset: 18) in her father; Hyperlipidemia  in her mother; Hypertension in her mother; Hypothyroidism in her mother; Sarcoidosis in her father.  ROS:   Please see the history of present illness.    *** All other systems reviewed and are negative.  EKGs/Labs/Other Studies Reviewed:    The following studies were reviewed today: ***  EKG:  EKG is *** ordered today.  The ekg ordered today demonstrates ***  Recent Labs: 05/02/2024: ALT 15; BUN 20; Creat 0.75; Hemoglobin 12.8; Platelets 257; Potassium 4.3; Sodium 137; TSH 1.57  Recent Lipid Panel    Component Value Date/Time   CHOL 188 05/02/2024 0755   CHOL 144 09/02/2012 0539   TRIG 98 05/02/2024 0755   TRIG 51 09/02/2012 0539   HDL 54 05/02/2024 0755   HDL 63 (H) 09/02/2012 0539    CHOLHDL 3.5 05/02/2024 0755   VLDL 14.2 03/30/2018 1041   VLDL 10 09/02/2012 0539   LDLCALC 114 (H) 05/02/2024 0755   LDLCALC 71 09/02/2012 0539    Physical Exam:    VS:  LMP 05/28/2013     Wt Readings from Last 3 Encounters:  05/09/24 172 lb 6 oz (78.2 kg)  11/04/23 173 lb (78.5 kg)  04/28/23 168 lb 6.4 oz (76.4 kg)     GEN: *** Well nourished, well developed in no acute distress HEENT: Normal NECK: No JVD; No carotid bruits LYMPHATICS: No lymphadenopathy CARDIAC: ***RRR, no murmurs, rubs, gallops RESPIRATORY:  Clear to auscultation without rales, wheezing or rhonchi  ABDOMEN: Soft, non-tender, non-distended MUSCULOSKELETAL:  No edema; No deformity  SKIN: Warm and dry NEUROLOGIC:  Alert and oriented x 3 PSYCHIATRIC:  Normal affect   ASSESSMENT:    No diagnosis found. PLAN:    Family history of heart disease:  Hyperlipidemia:  RTC in***   Medication Adjustments/Labs and Tests Ordered: Current medicines are reviewed at length with the patient today.  Concerns regarding medicines are outlined above.  No orders of the defined types were placed in this encounter.  No orders of the defined types were placed in this encounter.   There are no Patient Instructions on file for this visit.   Signed, Lonni LITTIE Nanas, MD  07/30/2024 5:30 PM    [facility]

## 2024-08-01 ENCOUNTER — Ambulatory Visit: Admitting: Cardiology

## 2024-08-22 ENCOUNTER — Encounter: Payer: Self-pay | Admitting: Family Medicine

## 2024-08-22 ENCOUNTER — Ambulatory Visit: Admitting: Family Medicine

## 2024-08-22 VITALS — BP 128/78 | HR 64 | Ht 62.0 in | Wt 172.8 lb

## 2024-08-22 DIAGNOSIS — J9801 Acute bronchospasm: Secondary | ICD-10-CM

## 2024-08-22 DIAGNOSIS — R051 Acute cough: Secondary | ICD-10-CM

## 2024-08-22 DIAGNOSIS — J0110 Acute frontal sinusitis, unspecified: Secondary | ICD-10-CM | POA: Diagnosis not present

## 2024-08-22 LAB — POC COVID19/FLU A&B COMBO
Covid Antigen, POC: NEGATIVE
Influenza A Antigen, POC: NEGATIVE
Influenza B Antigen, POC: NEGATIVE

## 2024-08-22 MED ORDER — GUAIFENESIN-CODEINE 100-10 MG/5ML PO SOLN: 5.0000 mL | Freq: Three times a day (TID) | ORAL | 0 refills | Status: DC | PRN

## 2024-08-22 MED ORDER — FLUTICASONE PROPIONATE 50 MCG/ACT NA SUSP: 2.0000 | Freq: Every day | NASAL | 0 refills | Status: DC

## 2024-08-22 MED ORDER — AZITHROMYCIN 250 MG PO TABS: ORAL_TABLET | ORAL | 0 refills | Status: DC

## 2024-08-22 NOTE — Progress Notes (Signed)
 Subjective:    Patient ID: [name], female    DOB: 07-16-1961, 63 y.o.   MRN: [id number]  [name] is a 63 y.o. female presenting on 08/22/2024 for Cough (X 1 week, concerned about wheezing) and Nasal Congestion  Patient presents for a same day appointment.  HPI  Discussed the use of AI scribe software for clinical note transcription with the patient, who gave verbal consent to proceed.  History of Present Illness   [name] is a 63 year old female who presents with persistent cough and hoarseness.  Cough and hoarseness - Persistent cough and hoarseness for the past week - Symptoms worsening, with severe hoarseness developing yesterday morning - Hoarseness has progressed to increased coughing, especially when talking - Tickly, sore throat present - Loss of voice associated with hoarseness - Cough does not disturb sleep  Wheezing and chest sensations - Wheezing onset yesterday evening, initially mistaken for phone vibrating - Wheezing noticeable when lying down - Sensation described as 'getting deeper in my chest' - No history of lung issues - No fever  Symptom exacerbation and functional impact - Coughing spells triggered by talking - Attempted to work for a couple of hours but found it difficult due to coughing  Symptom management - Current medications include Mucinex DM-12 and Mucinex cough drops, which provide some relief - Ibuprofen  used to manage symptoms - Nasal sprays available but not used recently - No prior use of inhalers - No use of stronger cough syrups in the past five years         08/22/2024   10:39 AM 05/09/2024    1:37 PM 11/04/2023    8:44 AM  Depression screen PHQ 2/9  Decreased Interest 0 0 0  Down, Depressed, Hopeless 0 0 0  PHQ - 2 Score 0 0 0  Altered sleeping 0 0   Tired, decreased energy 0 0   Change in appetite 0 0   Feeling bad or failure about yourself  0 0   Trouble concentrating 0 0   Moving slowly or fidgety/restless  0 0   Suicidal thoughts 0 0   PHQ-9 Score 0 0   Difficult doing work/chores Not difficult at all         08/22/2024   10:39 AM 05/09/2024    1:38 PM 11/04/2023    8:44 AM 04/28/2023    8:07 AM  GAD 7 : Generalized Anxiety Score  Nervous, Anxious, on Edge 1 1 0 1  Control/stop worrying 0 1 0 0  Worry too much - different things 0 0 0 0  Trouble relaxing 0 0 0 0  Restless 0 0 0 1  Easily annoyed or irritable 0 1 0 0  Afraid - awful might happen 0 0 0 0  Total GAD 7 Score 1 3 0 2  Anxiety Difficulty Not difficult at all Not difficult at all  Not difficult at all    Social History   Tobacco Use   Smoking status: Never   Smokeless tobacco: Never  Vaping Use   Vaping status: Never Used  Substance Use Topics   Alcohol use: No    Comment: social    Drug use: No    Review of Systems Per HPI unless specifically indicated above     Objective:    BP 128/78 (BP Location: Left Arm, Patient Position: Sitting, Cuff Size: Normal)   Pulse 64   Ht 5' 2 (1.575 m)   Wt 172 lb  12.8 oz (78.4 kg)   LMP 05/28/2013   SpO2 98%   BMI 31.61 kg/m   Wt Readings from Last 3 Encounters:  08/22/24 172 lb 12.8 oz (78.4 kg)  05/09/24 172 lb 6 oz (78.2 kg)  11/04/23 173 lb (78.5 kg)    Physical Exam Vitals and nursing note reviewed.  Constitutional:      General: She is not in acute distress.    Appearance: She is well-developed. She is not diaphoretic.     Comments: Well-appearing, comfortable, cooperative  HENT:     Head: Normocephalic and atraumatic.  Eyes:     General:        Right eye: No discharge.        Left eye: No discharge.     Conjunctiva/sclera: Conjunctivae normal.  Neck:     Thyroid : No thyromegaly.  Cardiovascular:     Rate and Rhythm: Normal rate and regular rhythm.     Heart sounds: Normal heart sounds. No murmur heard. Pulmonary:     Effort: Pulmonary effort is normal. No respiratory distress.     Breath sounds: Wheezing present. No rales.     Comments:  cough Musculoskeletal:        General: Normal range of motion.     Cervical back: Normal range of motion and neck supple.  Lymphadenopathy:     Cervical: No cervical adenopathy.  Skin:    General: Skin is warm and dry.     Findings: No erythema or rash.  Neurological:     Mental Status: She is alert and oriented to person, place, and time.  Psychiatric:        Behavior: Behavior normal.     Comments: Well groomed, good eye contact, normal speech and thoughts     Results for orders placed or performed in visit on 08/22/24  POC Covid19/Flu A&B Antigen   Collection Time: 08/22/24 10:37 AM  Result Value Ref Range   Influenza A Antigen, POC Negative Negative   Influenza B Antigen, POC Negative Negative   Covid Antigen, POC Negative Negative      Assessment & Plan:   Problem List Items Addressed This Visit   None Visit Diagnoses       Acute non-recurrent frontal sinusitis    -  Primary   Relevant Medications   azithromycin  (ZITHROMAX  Z-PAK) 250 MG tablet   fluticasone  (FLONASE ) 50 MCG/ACT nasal spray   guaiFENesin-codeine (VIRTUSSIN A/C) 100-10 MG/5ML syrup     Acute cough       Relevant Medications   azithromycin  (ZITHROMAX  Z-PAK) 250 MG tablet   fluticasone  (FLONASE ) 50 MCG/ACT nasal spray   Other Relevant Orders   POC Covid19/Flu A&B Antigen (Completed)     Bronchospasm, acute       Relevant Medications   guaiFENesin-codeine (VIRTUSSIN A/C) 100-10 MG/5ML syrup        Acute upper respiratory tract infection with cough and hoarseness Symptoms suggest viral infection with possible bacterial transition. Negative COVID-19 and influenza tests. Wheezing indicates possible bronchospasm. No fever or asthma history. - Prescribed azithromycin  for potential bacterial infection. - Prescribed codeine cough syrup for cough management and sleep aid. - Recommended Flonase  nasal spray for nasal congestion. - Continue Mucinex and cough drops as needed. - Advised ibuprofen  for sore  throat relief. - Provided work note for absence from September 29 to October 1, return on October 2. - Instructed to contact office if symptoms persist or additional treatment needed.        Orders  Placed This Encounter  Procedures   POC Covid19/Flu A&B Antigen    Meds ordered this encounter  Medications   azithromycin  (ZITHROMAX  Z-PAK) 250 MG tablet    Sig: Take 2 tabs (500mg  total) on Day 1. Take 1 tab (250mg ) daily for next 4 days.    Dispense:  6 tablet    Refill:  0   fluticasone  (FLONASE ) 50 MCG/ACT nasal spray    Sig: Place 2 sprays into both nostrils daily. Use for 4-6 weeks then stop and use seasonally or as needed.    Dispense:  16 g    Refill:  0   guaiFENesin-codeine (VIRTUSSIN A/C) 100-10 MG/5ML syrup    Sig: Take 5 mLs by mouth 3 (three) times daily as needed.    Dispense:  120 mL    Refill:  0    Follow up plan: Return if symptoms worsen or fail to improve.   Marsa Officer, DO [facility] 08/22/2024, 10:40 AM

## 2024-08-22 NOTE — Patient Instructions (Addendum)
 Thank you for coming to the office today.  Start nasal steroid Flonase  2 sprays in each nostril daily for 4-6 weeks, may repeat course seasonally or as needed  Start Azithromycin  Z pak (antibiotic) 2 tabs day 1, then 1 tab x 4 days, complete entire course even if improved  Cough syrup  Call or message back if not improving we can add Albuterol and Prednisone .   Please schedule a Follow-up Appointment to: Return if symptoms worsen or fail to improve.  If you have any other questions or concerns, please feel free to call the office or send a message through MyChart. You may also schedule an earlier appointment if necessary.  Additionally, you may be receiving a survey about your experience at our office within a few days to 1 week by e-mail or mail. We value your feedback.  Marsa Officer, DO [facility], [place]

## 2024-08-30 ENCOUNTER — Encounter: Payer: Self-pay | Admitting: Family Medicine

## 2024-08-30 DIAGNOSIS — R051 Acute cough: Secondary | ICD-10-CM

## 2024-08-30 DIAGNOSIS — J9801 Acute bronchospasm: Secondary | ICD-10-CM

## 2024-08-30 DIAGNOSIS — J0110 Acute frontal sinusitis, unspecified: Secondary | ICD-10-CM

## 2024-08-30 MED ORDER — PREDNISONE 10 MG PO TABS: ORAL_TABLET | ORAL | 0 refills | Status: DC

## 2024-09-13 ENCOUNTER — Other Ambulatory Visit: Payer: Self-pay | Admitting: Family Medicine

## 2024-09-13 DIAGNOSIS — R051 Acute cough: Secondary | ICD-10-CM

## 2024-09-13 DIAGNOSIS — J0110 Acute frontal sinusitis, unspecified: Secondary | ICD-10-CM

## 2024-09-14 NOTE — Telephone Encounter (Signed)
 Requested Prescriptions  Pending Prescriptions Disp Refills   fluticasone  (FLONASE ) 50 MCG/ACT nasal spray [Pharmacy Med Name: FLUTICASONE  PROP 50 MCG SPRAY] 48 mL 1    Sig: USE 2 SPRAYS IN EACH NOSTRILS DAILY. USE FOR 4-6 WEEKS, THEN STOP. USE SEASONALLY OR AS NEEDED     Ear, Nose, and Throat: Nasal Preparations - Corticosteroids Passed - 09/14/2024  5:48 PM      Passed - Valid encounter within last 12 months    Recent Outpatient Visits           3 weeks ago Acute non-recurrent frontal sinusitis   [facility], Marsa PARAS, DO   4 months ago Annual physical exam   [place] [facility] Edman Marsa PARAS, DO   8 months ago Bronchitis   [facility] Edman Marsa PARAS, DO       Future Appointments             In 2 weeks [name] Lonni CROME, MD [facility] at [facility] Dept of [facility] [employer], [employer]

## 2024-09-28 NOTE — Progress Notes (Unsigned)
 Cardiology Office Note:    Date:  09/29/2024   ID:  [name], DOB Aug 21, 1961, MRN [id number]  PCP:  Edman Marsa PARAS, DO  Cardiologist:  Lonni LITTIE Nanas, MD  Electrophysiologist:  None   Referring MD: Edman Marsa *   Chief Complaint  Patient presents with   Hyperlipidemia    History of Present Illness:    [name] is a 63 y.o. female with a hx of hyperlipidemia who is referred by Dr. Edman for evaluation of family history of heart disease.  Denies any chest pain, dyspnea, lightheadedness, syncope, lower extremity edema, or palpitations..  Not exercising but takes care of her mom who is 58.  Never smoked.  Father died of MI at 44.  Mother has Afib.  Past Medical History:  Diagnosis Date   Anxiety    Hyperlipidemia     Past Surgical History:  Procedure Laterality Date   BUNIONECTOMY Left    WISDOM TOOTH EXTRACTION      Current Medications: Current Meds  Medication Sig   Biotin 1000 MCG tablet Take by mouth.   busPIRone  (BUSPAR ) 10 MG tablet Take 10 mg by mouth 2 (two) times daily. (Patient taking differently: Take 15 mg by mouth 2 (two) times daily.)   calcium -vitamin D  (OSCAL WITH D) 500-200 MG-UNIT per tablet Take 1 tablet by mouth daily. For Vitamin D  replacement and mood control.   clonazePAM (KLONOPIN) 0.5 MG tablet Take 0.5 mg by mouth daily as needed.   diclofenac  (VOLTAREN ) 75 MG EC tablet Take 1 tablet (75 mg total) by mouth 2 (two) times daily as needed for moderate pain or mild pain.   Magnesium  Glycinate 100 MG CAPS Take by mouth at bedtime.   Multiple Vitamin (MULTIVITAMIN WITH MINERALS) TABS Take 1 tablet by mouth daily. For nutritional supplementation.   predniSONE  (DELTASONE ) 10 MG tablet Take 6 tabs with breakfast Day 1, 5 tabs Day 2, 4 tabs Day 3, 3 tabs Day 4, 2 tabs Day 5, 1 tab Day 6.   sertraline (ZOLOFT) 100 MG tablet Take 100 mg by mouth every morning.     Allergies:   Sulfa antibiotics   Social History    Socioeconomic History   Marital status: Divorced    Spouse name: Not on file   Number of children: 2   Years of education: Not on file   Highest education level: Some college, no degree  Occupational History   Occupation: [employer]    Comment: full time  Tobacco Use   Smoking status: Never   Smokeless tobacco: Never  Vaping Use   Vaping status: Never Used  Substance and Sexual Activity   Alcohol use: No    Comment: social    Drug use: No   Sexual activity: Never  Other Topics Concern   Not on file  Social History Narrative   Single.   2 children.   Works as LAWYER at [employer].   Enjoys going to the movies, traveling.   Social Drivers of Corporate Investment Banker Strain: Low Risk  (11/04/2023)   Overall Financial Resource Strain (CARDIA)    Difficulty of Paying Living Expenses: Not hard at all  Food Insecurity: No Food Insecurity (11/04/2023)   Hunger Vital Sign    Worried About Running Out of Food in the Last Year: Never true    Ran Out of Food in the Last Year: Never true  Transportation Needs: No Transportation Needs (11/04/2023)   PRAPARE - Transportation  Lack of Transportation (Medical): No    Lack of Transportation (Non-Medical): No  Physical Activity: Unknown (11/04/2023)   Exercise Vital Sign    Days of Exercise per Week: 0 days    Minutes of Exercise per Session: Not on file  Stress: No Stress Concern Present (11/04/2023)   [employer] of Occupational Health - Occupational Stress Questionnaire    Feeling of Stress : Not at all  Social Connections: Moderately Integrated (11/04/2023)   Social Connection and Isolation Panel    Frequency of Communication with Friends and Family: More than three times a week    Frequency of Social Gatherings with Friends and Family: Twice a week    Attends Religious Services: 1 to 4 times per year    Active Member of [employer] or Organizations: No    Attends Engineer, Structural: More than 4 times per year     Marital Status: Divorced     Family History: The patient's family history includes Anxiety disorder in her brother; Arthritis in her mother; Bipolar disorder in her brother; Breast cancer (age of onset: 65 - 91) in her maternal aunt; Depression in her brother and maternal aunt; Heart attack (age of onset: 94) in her father; Hyperlipidemia in her mother; Hypertension in her mother; Hypothyroidism in her mother; Sarcoidosis in her father.  ROS:   Please see the history of present illness.     All other systems reviewed and are negative.  EKGs/Labs/Other Studies Reviewed:    The following studies were reviewed today:   EKG:   09/29/2024: Normal sinus rhythm, rate 60, Q waves in leads III, aVF, poor R wave progression  Recent Labs: 05/02/2024: ALT 15; BUN 20; Creat 0.75; Hemoglobin 12.8; Platelets 257; Potassium 4.3; Sodium 137; TSH 1.57  Recent Lipid Panel    Component Value Date/Time   CHOL 188 05/02/2024 0755   CHOL 144 09/02/2012 0539   TRIG 98 05/02/2024 0755   TRIG 51 09/02/2012 0539   HDL 54 05/02/2024 0755   HDL 63 (H) 09/02/2012 0539   CHOLHDL 3.5 05/02/2024 0755   VLDL 14.2 03/30/2018 1041   VLDL 10 09/02/2012 0539   LDLCALC 114 (H) 05/02/2024 0755   LDLCALC 71 09/02/2012 0539    Physical Exam:    VS:  BP 125/85 (BP Location: Left Arm, Patient Position: Sitting, Cuff Size: Large)   Pulse 60   Ht 5' 4 (1.626 m)   Wt 173 lb 6.4 oz (78.7 kg)   LMP 05/28/2013   SpO2 93%   BMI 29.76 kg/m     Wt Readings from Last 3 Encounters:  09/29/24 173 lb 6.4 oz (78.7 kg)  08/22/24 172 lb 12.8 oz (78.4 kg)  05/09/24 172 lb 6 oz (78.2 kg)     GEN:  Well nourished, well developed in no acute distress HEENT: Normal NECK: No JVD; No carotid bruits LYMPHATICS: No lymphadenopathy CARDIAC: RRR, no murmurs, rubs, gallops RESPIRATORY:  Clear to auscultation without rales, wheezing or rhonchi  ABDOMEN: Soft, non-tender, non-distended MUSCULOSKELETAL:  No edema; No deformity   SKIN: Warm and dry NEUROLOGIC:  Alert and oriented x 3 PSYCHIATRIC:  Normal affect   ASSESSMENT:    1. Nonspecific abnormal electrocardiogram (ECG) (EKG)   2. Hyperlipidemia, unspecified hyperlipidemia type    PLAN:    Abnormal EKG: Q waves in inferior leads and poor R wave progression.  Check echocardiogram  Hyperlipidemia: LDL 114 on 05/02/2024.  Does have significant family history of heart disease, father died of MI at  age 27.  Recommend calcium  score for further risk stratification.  If elevated calcium  score will plan to start statin and target goal LDL less than 70  RTC in 6 months  Medication Adjustments/Labs and Tests Ordered: Current medicines are reviewed at length with the patient today.  Concerns regarding medicines are outlined above.  Orders Placed This Encounter  Procedures   CT CARDIAC SCORING (SELF PAY ONLY)   EKG 12-Lead   ECHOCARDIOGRAM COMPLETE   No orders of the defined types were placed in this encounter.   Patient Instructions  Medication Instructions:  Your physician recommends that you continue on your current medications as directed. Please refer to the Current Medication list given to you today.  *If you need a refill on your cardiac medications before your next appointment, please call your pharmacy*  Testing/Procedures: Your physician has requested that you have an echocardiogram. Echocardiography is a painless test that uses sound waves to create images of your heart. It provides your doctor with information about the size and shape of your heart and how well your heart's chambers and valves are working. This procedure takes approximately one hour. There are no restrictions for this procedure. Please do NOT wear cologne, perfume, aftershave, or lotions (deodorant is allowed). Please arrive 15 minutes prior to your appointment time.  Please note: We ask at that you not bring children with you during ultrasound (echo/ vascular) testing. Due to room  size and safety concerns, children are not allowed in the ultrasound rooms during exams. Our front office staff cannot provide observation of children in our lobby area while testing is being conducted. An adult accompanying a patient to their appointment will only be allowed in the ultrasound room at the discretion of the ultrasound technician under special circumstances. We apologize for any inconvenience.  Dr. [name] has ordered a CT coronary calcium  score.   Test locations:  [facility] at [facility] [place]  [facility] at [facility]  This is $99 out of pocket.   Coronary CalciumScan A coronary calcium  scan is an imaging test used to look for deposits of calcium  and other fatty materials (plaques) in the inner lining of the blood vessels of the heart (coronary arteries). These deposits of calcium  and plaques can partly clog and narrow the coronary arteries without producing any symptoms or warning signs. This puts a person at risk for a heart attack. This test can detect these deposits before symptoms develop. Tell a health care provider about: Any allergies you have. All medicines you are taking, including vitamins, herbs, eye drops, creams, and over-the-counter medicines. Any problems you or family members have had with anesthetic medicines. Any blood disorders you have. Any surgeries you have had. Any medical conditions you have. Whether you are pregnant or may be pregnant. What are the risks? Generally, this is a safe procedure. However, problems may occur, including: Harm to a pregnant woman and her unborn baby. This test involves the use of radiation. Radiation exposure can be dangerous to a pregnant woman and her unborn baby. If you are pregnant, you generally should not have this procedure done. Slight increase in the risk of cancer. This is because of the radiation involved in  the test. What happens before the procedure? No preparation is needed for this procedure. What happens during the procedure? You will undress and remove any jewelry around your neck or chest. You will put on a hospital gown. Sticky  electrodes will be placed on your chest. The electrodes will be connected to an electrocardiogram (ECG) machine to record a tracing of the electrical activity of your heart. A CT scanner will take pictures of your heart. During this time, you will be asked to lie still and hold your breath for 2-3 seconds while a picture of your heart is being taken. The procedure may vary among health care providers and hospitals. What happens after the procedure? You can get dressed. You can return to your normal activities. It is up to you to get the results of your test. Ask your health care provider, or the department that is doing the test, when your results will be ready. Summary A coronary calcium  scan is an imaging test used to look for deposits of calcium  and other fatty materials (plaques) in the inner lining of the blood vessels of the heart (coronary arteries). Generally, this is a safe procedure. Tell your health care provider if you are pregnant or may be pregnant. No preparation is needed for this procedure. A CT scanner will take pictures of your heart. You can return to your normal activities after the scan is done. This information is not intended to replace advice given to you by your health care provider. Make sure you discuss any questions you have with your health care provider. Document Released: 05/07/2008 Document Revised: 09/28/2016 Document Reviewed: 09/28/2016 Elsevier Interactive Patient Education  2017 [employer].   Follow-Up: At [facility], you and your health needs are our priority.  As part of our continuing mission to provide you with exceptional heart care, our providers are all part of one team.  This team includes your primary  Cardiologist (physician) and Advanced Practice Providers or APPs (Physician Assistants and Nurse Practitioners) who all work together to provide you with the care you need, when you need it.  Your next appointment:   6 month(s)  Provider:   Lonni LITTIE Nanas, MD        Signed, Lonni LITTIE Nanas, MD  09/29/2024 5:00 PM    [facility]

## 2024-09-29 ENCOUNTER — Encounter: Payer: Self-pay | Admitting: Cardiology

## 2024-09-29 ENCOUNTER — Ambulatory Visit: Attending: Cardiology | Admitting: Cardiology

## 2024-09-29 VITALS — BP 125/85 | HR 60 | Ht 64.0 in | Wt 173.4 lb

## 2024-09-29 DIAGNOSIS — R9431 Abnormal electrocardiogram [ECG] [EKG]: Secondary | ICD-10-CM

## 2024-09-29 DIAGNOSIS — E785 Hyperlipidemia, unspecified: Secondary | ICD-10-CM

## 2024-09-29 NOTE — Patient Instructions (Signed)
 Medication Instructions:  Your physician recommends that you continue on your current medications as directed. Please refer to the Current Medication list given to you today.  *If you need a refill on your cardiac medications before your next appointment, please call your pharmacy*  Testing/Procedures: Your physician has requested that you have an echocardiogram. Echocardiography is a painless test that uses sound waves to create images of your heart. It provides your doctor with information about the size and shape of your heart and how well your heart's chambers and valves are working. This procedure takes approximately one hour. There are no restrictions for this procedure. Please do NOT wear cologne, perfume, aftershave, or lotions (deodorant is allowed). Please arrive 15 minutes prior to your appointment time.  Please note: We ask at that you not bring children with you during ultrasound (echo/ vascular) testing. Due to room size and safety concerns, children are not allowed in the ultrasound rooms during exams. Our front office staff cannot provide observation of children in our lobby area while testing is being conducted. An adult accompanying a patient to their appointment will only be allowed in the ultrasound room at the discretion of the ultrasound technician under special circumstances. We apologize for any inconvenience.  Dr. [name] has ordered a CT coronary calcium  score.   Test locations:  [facility] at [facility] [place]  [facility] at [facility]  This is $99 out of pocket.   Coronary CalciumScan A coronary calcium  scan is an imaging test used to look for deposits of calcium  and other fatty materials (plaques) in the inner lining of the blood vessels of the heart (coronary arteries). These deposits of calcium  and plaques can partly clog and narrow the coronary arteries without  producing any symptoms or warning signs. This puts a person at risk for a heart attack. This test can detect these deposits before symptoms develop. Tell a health care provider about: Any allergies you have. All medicines you are taking, including vitamins, herbs, eye drops, creams, and over-the-counter medicines. Any problems you or family members have had with anesthetic medicines. Any blood disorders you have. Any surgeries you have had. Any medical conditions you have. Whether you are pregnant or may be pregnant. What are the risks? Generally, this is a safe procedure. However, problems may occur, including: Harm to a pregnant woman and her unborn baby. This test involves the use of radiation. Radiation exposure can be dangerous to a pregnant woman and her unborn baby. If you are pregnant, you generally should not have this procedure done. Slight increase in the risk of cancer. This is because of the radiation involved in the test. What happens before the procedure? No preparation is needed for this procedure. What happens during the procedure? You will undress and remove any jewelry around your neck or chest. You will put on a hospital gown. Sticky electrodes will be placed on your chest. The electrodes will be connected to an electrocardiogram (ECG) machine to record a tracing of the electrical activity of your heart. A CT scanner will take pictures of your heart. During this time, you will be asked to lie still and hold your breath for 2-3 seconds while a picture of your heart is being taken. The procedure may vary among health care providers and hospitals. What happens after the procedure? You can get dressed. You can return to your normal activities. It is up to you to get the  results of your test. Ask your health care provider, or the department that is doing the test, when your results will be ready. Summary A coronary calcium  scan is an imaging test used to look for deposits of  calcium  and other fatty materials (plaques) in the inner lining of the blood vessels of the heart (coronary arteries). Generally, this is a safe procedure. Tell your health care provider if you are pregnant or may be pregnant. No preparation is needed for this procedure. A CT scanner will take pictures of your heart. You can return to your normal activities after the scan is done. This information is not intended to replace advice given to you by your health care provider. Make sure you discuss any questions you have with your health care provider. Document Released: 05/07/2008 Document Revised: 09/28/2016 Document Reviewed: 09/28/2016 Elsevier Interactive Patient Education  2017 [employer].   Follow-Up: At [facility], you and your health needs are our priority.  As part of our continuing mission to provide you with exceptional heart care, our providers are all part of one team.  This team includes your primary Cardiologist (physician) and Advanced Practice Providers or APPs (Physician Assistants and Nurse Practitioners) who all work together to provide you with the care you need, when you need it.  Your next appointment:   6 month(s)  Provider:   Lonni LITTIE Nanas, MD

## 2024-10-03 ENCOUNTER — Ambulatory Visit: Payer: Self-pay | Admitting: Cardiology

## 2024-10-03 ENCOUNTER — Ambulatory Visit (HOSPITAL_COMMUNITY)
Admission: RE | Admit: 2024-10-03 | Discharge: 2024-10-03 | Disposition: A | Discharge status: Home / Self Care | Payer: Self-pay | Source: Ambulatory Visit | Attending: Cardiovascular Disease | Admitting: Cardiovascular Disease

## 2024-10-03 DIAGNOSIS — E785 Hyperlipidemia, unspecified: Secondary | ICD-10-CM | POA: Insufficient documentation

## 2024-10-03 DIAGNOSIS — I10 Essential (primary) hypertension: Secondary | ICD-10-CM

## 2024-10-03 DIAGNOSIS — I422 Other hypertrophic cardiomyopathy: Secondary | ICD-10-CM

## 2024-10-26 ENCOUNTER — Other Ambulatory Visit

## 2024-10-26 ENCOUNTER — Other Ambulatory Visit: Payer: Self-pay

## 2024-10-26 DIAGNOSIS — R7309 Other abnormal glucose: Secondary | ICD-10-CM

## 2024-10-26 DIAGNOSIS — E7800 Pure hypercholesterolemia, unspecified: Secondary | ICD-10-CM

## 2024-10-26 DIAGNOSIS — I10 Essential (primary) hypertension: Secondary | ICD-10-CM

## 2024-10-27 LAB — LIPID PANEL
Cholesterol: 186 mg/dL (ref <200)
HDL: 52 mg/dL (ref >=50)
LDL Cholesterol (Calc): 109 mg/dL — ABNORMAL HIGH
Non-HDL Cholesterol (Calc): 134 mg/dL — ABNORMAL HIGH (ref <130)
Total CHOL/HDL Ratio: 3.6 (calc) (ref <5.0)
Triglycerides: 134 mg/dL (ref <150)

## 2024-11-01 ENCOUNTER — Encounter: Payer: Self-pay | Admitting: Family Medicine

## 2024-11-01 ENCOUNTER — Other Ambulatory Visit: Payer: Self-pay | Admitting: Family Medicine

## 2024-11-01 ENCOUNTER — Ambulatory Visit: Admitting: Family Medicine

## 2024-11-01 VITALS — BP 118/80 | HR 67 | Ht 64.0 in | Wt 173.5 lb

## 2024-11-01 DIAGNOSIS — Z23 Encounter for immunization: Secondary | ICD-10-CM

## 2024-11-01 DIAGNOSIS — E7800 Pure hypercholesterolemia, unspecified: Secondary | ICD-10-CM

## 2024-11-01 DIAGNOSIS — R7309 Other abnormal glucose: Secondary | ICD-10-CM

## 2024-11-01 DIAGNOSIS — Z8349 Family history of other endocrine, nutritional and metabolic diseases: Secondary | ICD-10-CM

## 2024-11-01 DIAGNOSIS — E559 Vitamin D deficiency, unspecified: Secondary | ICD-10-CM

## 2024-11-01 DIAGNOSIS — Z0000 Encounter for general adult medical examination without abnormal findings: Secondary | ICD-10-CM

## 2024-11-01 DIAGNOSIS — I10 Essential (primary) hypertension: Secondary | ICD-10-CM

## 2024-11-01 DIAGNOSIS — Z8249 Family history of ischemic heart disease and other diseases of the circulatory system: Secondary | ICD-10-CM

## 2024-11-01 LAB — POCT GLYCOSYLATED HEMOGLOBIN (HGB A1C): Hemoglobin A1C: 5.4 % (ref 4.0–5.6)

## 2024-11-01 NOTE — Patient Instructions (Addendum)
 Thank you for coming to the office today.  Upcoming ECHO, stay tuned for results from Cardiology  My suggestion on the BP is to monitor readings more often to be on the look out or surveillance  If BP < 140 / 90 consistently that is okay  If rare elevated BP 1 x a month or so, okay to take your BP pill but my suggestion is it is not required.  If BP raises more often, 5+ times a month or more, then maybe we should re-visit the topic and reconsider a low dose constant med rather than intermittent.  Recent Labs    11/04/23 0833 05/02/24 0755 11/01/24 1319  HGBA1C 5.5 5.7* 5.4   Flu Shot today  Consider Pneumonia vaccine   DUE for FASTING BLOOD WORK (no food or drink after midnight before the lab appointment, only water or coffee without cream/sugar on the morning of)  SCHEDULE Lab Only visit in the morning at the clinic for lab draw in 6 MONTHS   - Make sure Lab Only appointment is at about 1 week before your next appointment, so that results will be available  For Lab Results, once available within 2-3 days of blood draw, you can can log in to MyChart online to view your results and a brief explanation. Also, we can discuss results at next follow-up visit.   Please schedule a Follow-up Appointment to: Return for 6 month fasting lab > 1 week later Annual Physical.  If you have any other questions or concerns, please feel free to call the office or send a message through MyChart. You may also schedule an earlier appointment if necessary.  Additionally, you may be receiving a survey about your experience at our office within a few days to 1 week by e-mail or mail. We value your feedback.  Marsa Officer, DO [facility], [place]

## 2024-11-01 NOTE — Progress Notes (Signed)
 Subjective:    Patient ID: [name], female    DOB: 07-27-1961, 63 y.o.   MRN: [id number]  [name] is a 63 y.o. female presenting on 11/01/2024 for Hypertension   HPI  Discussed the use of AI scribe software for clinical note transcription with the patient, who gave verbal consent to proceed.  History of Present Illness   [name] is a 63 year old female with hypertension and anxiety who presents for a routine follow-up visit.   Family History of Heart Disease Followed by Cardiology for preventative management Prior Coronary Calcium  CT Score 0, excellent result 09/2024 Further Cardiac evaluation given her strong fam history - Scheduled for echocardiogram - Previous EKG performed with some abnormality, now pending further diagnostics - Family history of heart disease - Undergoing further cardiac evaluation  HYPERLIPIDEMIA: - Reports no concerns. Last lipid panel 04/2024, with LDL 114, prior range 110s to 130s Not on statin or cholesterol medicine Lifestyle - Diet: improving diet - Exercise: walking exercise   Elevated A1c A1c 5.4, prior range 5.5 to 5.9 She is improving diet and reducing intake of soda and tea She has treadmill inc steps, goal 10k steps No fam history of Diabetes   Insomnia / Anxiety / Stress Major Depression, chronic recurrent - currently in remission Prior history on chart possible Bipolar, mood disorder Followed by Dr Chipper Psychiatry + Therapist every other week She is currently taking sertraline (Zoloft) 100 mg for anxiety, clonazepam 0.5 mg as needed, and buspirone  10 mg twice a day.    CHRONIC HTN: She has stopped taking olmesartan  (Benicar ) 20 mg for blood pressure after noticing low readings, such as 90/60, - Now  medication not taken regularly; used only when perceiving elevated blood pressure, approximately once a month - Blood pressure readings during perceived elevations around 130/90 - Identifies stress as a trigger for blood  pressure spikes Meds - None off of med. Denies CP, dyspnea, HA, edema, dizziness / lightheadedness     Osteoarthritis, multiple joints L5-S1 Spondylolisthesis / DDD Followed by [facility] Has had X-rays and PT Tried medicines in past, including Voltaren , AS NEEDED use re ordered refills not taking every day   Family History of Hypothyroidism (Daughter, and Mother) She has not had any problems. Last labs TSH T4 normal Not on treatment     Health Maintenance:  - Last Cologuard 06/17/24 negative, repeat 3 years, 2028 She had prior Colonoscopy approx 2015, 10 years ago.   Mammogram completed 04/07/24 negative.   She is due for cervical CA screening, she will schedule w GYN - arranging w/ [facility], requesting records from previous [facility] to be sent. Then will schedule      11/01/2024    1:11 PM 08/22/2024   10:39 AM 05/09/2024    1:37 PM  Depression screen PHQ 2/9  Decreased Interest 0 0 0  Down, Depressed, Hopeless 0 0 0  PHQ - 2 Score 0 0 0  Altered sleeping  0 0  Tired, decreased energy  0 0  Change in appetite  0 0  Feeling bad or failure about yourself   0 0  Trouble concentrating  0 0  Moving slowly or fidgety/restless  0 0  Suicidal thoughts  0 0  PHQ-9 Score  0  0   Difficult doing work/chores  Not difficult at all      Data saved with a previous flowsheet row definition  11/01/2024    1:11 PM 08/22/2024   10:39 AM 05/09/2024    1:38 PM 11/04/2023    8:44 AM  GAD 7 : Generalized Anxiety Score  Nervous, Anxious, on Edge 1 1 1  0  Control/stop worrying 0 0 1 0  Worry too much - different things 0 0 0 0  Trouble relaxing 1 0 0 0  Restless 0 0 0 0  Easily annoyed or irritable 0 0 1 0  Afraid - awful might happen 0 0 0 0  Total GAD 7 Score 2 1 3  0  Anxiety Difficulty  Not difficult at all Not difficult at all     Social History   Tobacco Use   Smoking status: Never   Smokeless tobacco: Never  Vaping Use   Vaping  status: Never Used  Substance Use Topics   Alcohol use: No    Comment: social    Drug use: No    Review of Systems Per HPI unless specifically indicated above     Objective:    BP 118/80 (BP Location: Left Arm, Patient Position: Sitting, Cuff Size: Normal)   Pulse 67   Ht 5' 4 (1.626 m)   Wt 173 lb 8 oz (78.7 kg)   LMP 05/28/2013   SpO2 96%   BMI 29.78 kg/m   Wt Readings from Last 3 Encounters:  11/01/24 173 lb 8 oz (78.7 kg)  09/29/24 173 lb 6.4 oz (78.7 kg)  08/22/24 172 lb 12.8 oz (78.4 kg)    Physical Exam Vitals and nursing note reviewed.  Constitutional:      General: She is not in acute distress.    Appearance: Normal appearance. She is well-developed. She is not diaphoretic.     Comments: Well-appearing, comfortable, cooperative  HENT:     Head: Normocephalic and atraumatic.  Eyes:     General:        Right eye: No discharge.        Left eye: No discharge.     Conjunctiva/sclera: Conjunctivae normal.  Cardiovascular:     Rate and Rhythm: Normal rate.  Pulmonary:     Effort: Pulmonary effort is normal.  Skin:    General: Skin is warm and dry.     Findings: No erythema or rash.  Neurological:     Mental Status: She is alert and oriented to person, place, and time.  Psychiatric:        Mood and Affect: Mood normal.        Behavior: Behavior normal.        Thought Content: Thought content normal.     Comments: Well groomed, good eye contact, normal speech and thoughts     I have personally reviewed the radiology report from 09/29/24 on CT Coronary.  Addendum Addendum 10/05/2024 11:21 PM EST: ADDENDUM REPORT: 10/05/2024 23:19  EXAM: OVER-READ INTERPRETATION CT CHEST  The following report is an over-read performed by radiologist Dr. Oneil Devonshire of [facility] on 10/05/2024. This over-read does not include interpretation of cardiac or coronary anatomy or pathology. The coronary calcium  score interpretation by the cardiologist is  attached.  COMPARISON: None.  FINDINGS: Cardiovascular: Dilatation of the ascending aorta to 4 cm is noted. Mild calcifications are noted in the descending aorta.  Mediastinum/Nodes: There are no enlarged lymph nodes within the visualized mediastinum.  Lungs/Pleura: There is no pleural effusion. The visualized lungs appear clear.  Upper abdomen: No significant findings in the visualized upper abdomen.  Musculoskeletal/Chest wall: No chest wall mass  or suspicious osseous findings within the visualized chest.  IMPRESSION: Dilatation of the ascending aorta to 4 cm. Recommend annual imaging followup by CTA or MRA. This recommendation follows 2010 ACCF/AHA/AATS/ACR/ASA/SCA/SCAI/SIR/STS/SVM Guidelines for the Diagnosis and Management of Patients with Thoracic Aortic Disease. Circulation. 2010; 121: Z733-z630. Aortic aneurysm NOS (ICD10-I71.9)   Electronically Signed By: Oneil Devonshire M.D. On: 10/05/2024 23:19   Study Result CLINICAL DATA: Cardiovascular Disease Risk stratification  EXAM: Coronary Calcium  Score  TECHNIQUE: A gated, non-contrast computed tomography scan of the heart was performed using 3mm slice thickness. Axial images were analyzed on a dedicated workstation. Calcium  scoring of the coronary arteries was performed using the Agatston method.  FINDINGS: Coronary arteries: Normal origins.  Coronary Calcium  Score:  Left main: 0  Left anterior descending artery: 0  Left circumflex artery: 0  Right coronary artery: 0  Total: 0  Percentile: 0  Pericardium: Normal.  Aorta: Borderline dilated caliber of ascending aorta (39 mm). Aortic atherosclerosis noted.  Non-cardiac: See separate report from [facility].  IMPRESSION: Coronary calcium  score of 0. This was 0 percentile for age-, race-, and sex-matched controls. Borderline dilated caliber of ascending aorta (39 mm). Aortic atherosclerosis noted.  RECOMMENDATIONS: Coronary artery  calcium  (CAC) score is a strong predictor of incident coronary heart disease (CHD) and provides predictive information beyond traditional risk factors. CAC scoring is reasonable to use in the decision to withhold, postpone, or initiate statin therapy in intermediate-risk or selected borderline-risk asymptomatic adults (age 82-75 years and LDL-C >=70 to <190 mg/dL) who do not have diabetes or established atherosclerotic cardiovascular disease (ASCVD).* In intermediate-risk (10-year ASCVD risk >=7.5% to <20%) adults or selected borderline-risk (10-year ASCVD risk >=5% to <7.5%) adults in whom a CAC score is measured for the purpose of making a treatment decision the following recommendations have been made:  If CAC=0, it is reasonable to withhold statin therapy and reassess in 5 to 10 years, as long as higher risk conditions are absent (diabetes mellitus, family history of premature CHD in first degree relatives (males <55 years; females <65 years), cigarette smoking, or LDL >=190 mg/dL).  If CAC is 1 to 99, it is reasonable to initiate statin therapy for patients >=98 years of age.  If CAC is >=100 or >=75th percentile, it is reasonable to initiate statin therapy at any age.  Cardiology referral should be considered for patients with CAC scores >=400 or >=75th percentile.  *2018 AHA/ACC/AACVPR/AAPA/ABC/ACPM/ADA/AGS/APhA/ASPC/NLA/PCNA Guideline on the Management of Blood Cholesterol: A Report of the American College of Cardiology/American Heart Association Task Force on Clinical Practice Guidelines. J Am Coll Cardiol. 2019;73(24):3168-3209.  Shelda Bruckner, MD  Electronically Signed: By: Shelda Bruckner M.D. On: 10/03/2024 16:08  Results for orders placed or performed in visit on 11/01/24  POCT HgB A1C   Collection Time: 11/01/24  1:19 PM  Result Value Ref Range   Hemoglobin A1C 5.4 4.0 - 5.6 %   HbA1c POC (<> result, manual entry)     HbA1c, POC (prediabetic  range)     HbA1c, POC (controlled diabetic range)        Assessment & Plan:   Problem List Items Addressed This Visit     Essential hypertension   Family history of hypothyroidism   Hyperlipidemia   Other Visit Diagnoses       Elevated hemoglobin A1c    -  Primary   Relevant Orders   POCT HgB A1C (Completed)     Flu vaccine need       Relevant Orders  Flu vaccine trivalent PF, 6mos and older(Flulaval,Afluria,Fluarix,Fluzone) (Completed)     Family history of heart disease             Prediabetes A1c 5.4% Improvement in blood sugar levels attributed to lifestyle changes. - Continue lifestyle modifications: reduce soda and tea, increase water intake, maintain physical activity.  Essential hypertension Blood pressure well-controlled at 118/80. Intermittent medication use unnecessary. Rare stress-related spikes not significant. - Monitor blood pressure more frequently, aim for consistent timing.  Recommend to avoid intermittent anti-hypertension therapy, she has remaining ARB Olmesartan  but no longer taking it regularly. Continue without medication if blood pressure remains <140/90. Ideally goal < 135/85 or less Reassess medication need if spikes occur >5 times/month.  Anxiety disorder Followed by Psychiatry Dr Chipper Managed with Buspar , clonazepam, and sertraline. - Continue current medication regimen.  Subacute rhinitis Recent URI / sinusitis, resolved but has some lingering symptoms Symptoms managed with Flonase  nasal spray. - Continue Flonase  nasal spray as needed.  Family history of heart disease Coronary Calcium  CT negative, 0 score Has upcoming ECHO, after abnormal EKG  General Health Maintenance Flu shot given. Pneumonia vaccine deferred. Shingles vaccine declined. Pap smear records pending. Cologuard negative. - Consider pneumonia vaccine in future. 50+ - Obtain Pap smear records. From GYN when she schedules for overdue pap - Continue routine health  screenings.        Orders Placed This Encounter  Procedures   Flu vaccine trivalent PF, 6mos and older(Flulaval,Afluria,Fluarix,Fluzone)   POCT HgB A1C    No orders of the defined types were placed in this encounter.   Follow up plan: Return for 6 month fasting lab > 1 week later Annual Physical.  Future labs ordered for 05/01/25   Marsa Officer, DO [facility] 11/01/2024, 1:26 PM

## 2024-11-06 ENCOUNTER — Ambulatory Visit (HOSPITAL_COMMUNITY)
Admission: RE | Admit: 2024-11-06 | Discharge: 2024-11-06 | Disposition: A | Discharge status: Home / Self Care | Source: Ambulatory Visit | Attending: Cardiology | Admitting: Cardiology

## 2024-11-06 DIAGNOSIS — R9431 Abnormal electrocardiogram [ECG] [EKG]: Secondary | ICD-10-CM | POA: Diagnosis present

## 2024-11-06 LAB — ECHOCARDIOGRAM COMPLETE
Area-P 1/2: 3.6 cm2
S' Lateral: 2.7 cm

## 2024-11-20 NOTE — Telephone Encounter (Signed)
 Called and made patient aware that she can have labs done at any [facility]. Per patient scheduled called to scheduled MRI and per patient she will give them a call back to schedule MRI. Made patient aware to call office for any other questions.

## 2024-12-04 ENCOUNTER — Encounter (HOSPITAL_COMMUNITY): Payer: Self-pay

## 2024-12-05 ENCOUNTER — Other Ambulatory Visit: Payer: Self-pay | Admitting: Cardiology

## 2024-12-05 ENCOUNTER — Ambulatory Visit (HOSPITAL_COMMUNITY)
Admission: RE | Admit: 2024-12-05 | Discharge: 2024-12-05 | Disposition: A | Discharge status: Home / Self Care | Source: Ambulatory Visit | Attending: Cardiology | Admitting: Cardiology

## 2024-12-05 DIAGNOSIS — I422 Other hypertrophic cardiomyopathy: Secondary | ICD-10-CM | POA: Diagnosis not present

## 2024-12-05 MED ORDER — GADOBUTROL 1 MMOL/ML IV SOLN
10.0000 mL | Freq: Once | INTRAVENOUS | Status: AC | PRN
  Administered 2024-12-05: 10 mL via INTRAVENOUS

## 2024-12-06 ENCOUNTER — Ambulatory Visit: Payer: Self-pay | Admitting: Cardiology

## 2024-12-11 ENCOUNTER — Other Ambulatory Visit: Payer: Self-pay

## 2024-12-11 DIAGNOSIS — I422 Other hypertrophic cardiomyopathy: Secondary | ICD-10-CM

## 2024-12-11 DIAGNOSIS — I10 Essential (primary) hypertension: Secondary | ICD-10-CM

## 2024-12-11 LAB — CBC
Hematocrit: 43.5 % (ref 34.0–46.6)
Hemoglobin: 13.8 g/dL (ref 11.1–15.9)
MCH: 28.6 pg (ref 26.6–33.0)
MCHC: 31.7 g/dL (ref 31.5–35.7)
MCV: 90 fL (ref 79–97)
Platelets: 283 x10E3/uL (ref 150–450)
RBC: 4.83 x10E6/uL (ref 3.77–5.28)
RDW: 13.3 % (ref 11.7–15.4)
WBC: 8.6 x10E3/uL (ref 3.4–10.8)

## 2024-12-13 ENCOUNTER — Other Ambulatory Visit: Payer: Self-pay

## 2024-12-13 DIAGNOSIS — I422 Other hypertrophic cardiomyopathy: Secondary | ICD-10-CM

## 2024-12-19 ENCOUNTER — Other Ambulatory Visit (HOSPITAL_COMMUNITY): Payer: Self-pay

## 2025-03-27 ENCOUNTER — Ambulatory Visit: Admitting: Cardiology

## 2025-04-24 ENCOUNTER — Other Ambulatory Visit

## 2025-05-01 ENCOUNTER — Other Ambulatory Visit

## 2025-05-08 ENCOUNTER — Encounter: Admitting: Family Medicine
